# Patient Record
Sex: Female | Born: 1955
Health system: Southern US, Community
[De-identification: ages and names within clinical notes are randomized; demographics above are authoritative.]

## PROBLEM LIST (undated history)

## (undated) DIAGNOSIS — R112 Nausea with vomiting, unspecified: Secondary | ICD-10-CM

## (undated) DIAGNOSIS — I10 Essential (primary) hypertension: Secondary | ICD-10-CM

## (undated) DIAGNOSIS — E119 Type 2 diabetes mellitus without complications: Secondary | ICD-10-CM

## (undated) DIAGNOSIS — Z9889 Other specified postprocedural states: Secondary | ICD-10-CM

## (undated) HISTORY — PX: TUBAL LIGATION: SHX77

## (undated) HISTORY — DX: Type 2 diabetes mellitus without complications: E11.9

## (undated) HISTORY — DX: Essential (primary) hypertension: I10

---

## 2001-08-10 ENCOUNTER — Ambulatory Visit (HOSPITAL_COMMUNITY): Admission: RE | Admit: 2001-08-10 | Discharge: 2001-08-10 | Payer: Self-pay | Admitting: Obstetrics and Gynecology

## 2001-08-10 ENCOUNTER — Encounter: Payer: Self-pay | Admitting: Obstetrics and Gynecology

## 2002-01-24 ENCOUNTER — Other Ambulatory Visit: Admission: RE | Admit: 2002-01-24 | Discharge: 2002-01-24 | Payer: Self-pay | Admitting: Obstetrics and Gynecology

## 2002-03-27 ENCOUNTER — Other Ambulatory Visit: Admission: RE | Admit: 2002-03-27 | Discharge: 2002-03-27 | Payer: Self-pay | Admitting: Obstetrics & Gynecology

## 2002-08-10 ENCOUNTER — Encounter: Payer: Self-pay | Admitting: Obstetrics and Gynecology

## 2002-08-10 ENCOUNTER — Ambulatory Visit (HOSPITAL_COMMUNITY): Admission: RE | Admit: 2002-08-10 | Discharge: 2002-08-10 | Payer: Self-pay | Admitting: Obstetrics and Gynecology

## 2002-08-18 ENCOUNTER — Encounter: Payer: Self-pay | Admitting: Obstetrics and Gynecology

## 2002-08-18 ENCOUNTER — Ambulatory Visit (HOSPITAL_COMMUNITY): Admission: RE | Admit: 2002-08-18 | Discharge: 2002-08-18 | Payer: Self-pay | Admitting: Obstetrics and Gynecology

## 2003-08-21 ENCOUNTER — Encounter: Payer: Self-pay | Admitting: Obstetrics and Gynecology

## 2003-08-21 ENCOUNTER — Ambulatory Visit (HOSPITAL_COMMUNITY): Admission: RE | Admit: 2003-08-21 | Discharge: 2003-08-21 | Payer: Self-pay | Admitting: Obstetrics and Gynecology

## 2004-10-07 ENCOUNTER — Ambulatory Visit (HOSPITAL_COMMUNITY): Admission: RE | Admit: 2004-10-07 | Discharge: 2004-10-07 | Payer: Self-pay | Admitting: Obstetrics and Gynecology

## 2005-10-30 ENCOUNTER — Ambulatory Visit (HOSPITAL_COMMUNITY): Admission: RE | Admit: 2005-10-30 | Discharge: 2005-10-30 | Payer: Self-pay | Admitting: Obstetrics and Gynecology

## 2006-11-01 ENCOUNTER — Ambulatory Visit (HOSPITAL_COMMUNITY): Admission: RE | Admit: 2006-11-01 | Discharge: 2006-11-01 | Payer: Self-pay | Admitting: Obstetrics and Gynecology

## 2007-08-08 ENCOUNTER — Ambulatory Visit (HOSPITAL_COMMUNITY): Admission: RE | Admit: 2007-08-08 | Discharge: 2007-08-08 | Payer: Self-pay | Admitting: Gastroenterology

## 2007-08-08 ENCOUNTER — Ambulatory Visit: Payer: Self-pay | Admitting: Gastroenterology

## 2007-08-08 ENCOUNTER — Encounter: Payer: Self-pay | Admitting: Gastroenterology

## 2007-08-08 HISTORY — PX: COLONOSCOPY: SHX174

## 2007-11-03 ENCOUNTER — Ambulatory Visit (HOSPITAL_COMMUNITY): Admission: RE | Admit: 2007-11-03 | Discharge: 2007-11-03 | Payer: Self-pay | Admitting: Obstetrics and Gynecology

## 2008-03-30 ENCOUNTER — Other Ambulatory Visit: Admission: RE | Admit: 2008-03-30 | Discharge: 2008-03-30 | Payer: Self-pay | Admitting: Obstetrics & Gynecology

## 2008-11-06 ENCOUNTER — Ambulatory Visit (HOSPITAL_COMMUNITY): Admission: RE | Admit: 2008-11-06 | Discharge: 2008-11-06 | Payer: Self-pay | Admitting: Obstetrics and Gynecology

## 2009-04-19 ENCOUNTER — Other Ambulatory Visit: Admission: RE | Admit: 2009-04-19 | Discharge: 2009-04-19 | Payer: Self-pay | Admitting: Obstetrics and Gynecology

## 2009-11-12 ENCOUNTER — Ambulatory Visit (HOSPITAL_COMMUNITY): Admission: RE | Admit: 2009-11-12 | Discharge: 2009-11-12 | Payer: Self-pay | Admitting: Obstetrics and Gynecology

## 2010-11-17 ENCOUNTER — Ambulatory Visit (HOSPITAL_COMMUNITY)
Admission: RE | Admit: 2010-11-17 | Discharge: 2010-11-17 | Payer: Self-pay | Source: Home / Self Care | Admitting: Obstetrics and Gynecology

## 2011-01-03 ENCOUNTER — Encounter: Payer: Self-pay | Admitting: Obstetrics and Gynecology

## 2011-04-28 NOTE — Op Note (Signed)
NAME:  Mary Vazquez, Mary Vazquez           ACCOUNT NO.:  [id number]   MEDICAL RECORD NO.:  [id number]          PATIENT TYPE:  AMB   LOCATION:  DAY                           FACILITY:  [facility]   PHYSICIAN:  Kassie Mends, M.D.      DATE OF BIRTH:  [birth year]   DATE OF PROCEDURE:  08/08/2007  DATE OF DISCHARGE:                               OPERATIVE REPORT   PROCEDURE:  Colonoscopy with cold forceps polypectomy.   INDICATION FOR EXAM:  Mary Vazquez is a 55 year old female who  presents for average risk colon cancer screening.   FINDINGS:  1. A 5-mm sessile descending colon polyp removed via cold forceps.      Otherwise no masses, inflammatory changes, diverticula, or AVMs      seen.  2. Normal retroflexed view of the rectum.  3. Small internal hemorrhoids.   RECOMMENDATIONS:  1. Will call Mary Vazquez with results of her biopsies.  If her      polyp is adenomatous, then her next screening colonoscopy should be      in 5 years.  As well her siblings and children should have a      screening colonoscopy at age 70, and then every 5 years.  2. She should follow a high fiber diet.  She is given a handout on      high-fiber diet and polyps.  3. She is also given information on hemorrhoids because small internal      hemorrhoids were seen on exam.  4. No aspirin, NSAIDs, or anticoagulation for 5 days.   MEDICATIONS:  1. Demerol 75 mg IV.  2. Versed 5 mg IV.   PROCEDURE TECHNIQUE:  Physical exam was performed and an informed  consent was obtained from the patient after explaining the benefits,  risks, and alternatives to the procedure.  The patient was connected to  the monitor and placed in left lateral position.  Continuous oxygen was  provided by nasal cannula.  IV medicine administered through an  indwelling cannula.  After administration of sedation and rectal exam,  the patient's rectum  was intubated; and the scope was advanced under direct visualization to  the cecum.  The scope  was removed slowly by carefully examining the  color, texture, anatomy, and integrity of the mucosa on the way out.  The patient was recovered in endoscopy and discharged home in  satisfactory condition.      Kassie Mends, M.D.  Electronically Signed     SM/MEDQ  D:  08/08/2007  T:  08/08/2007  Job:  [id number]   cc:   Donna Bernard, M.D.  Fax: [phone]

## 2011-11-26 ENCOUNTER — Other Ambulatory Visit: Payer: Self-pay | Admitting: Obstetrics and Gynecology

## 2011-11-26 DIAGNOSIS — Z139 Encounter for screening, unspecified: Secondary | ICD-10-CM

## 2011-11-27 ENCOUNTER — Ambulatory Visit (HOSPITAL_COMMUNITY)
Admission: RE | Admit: 2011-11-27 | Discharge: 2011-11-27 | Disposition: A | Discharge status: Home / Self Care | Payer: BC Managed Care – PPO | Source: Ambulatory Visit | Attending: Obstetrics and Gynecology | Admitting: Obstetrics and Gynecology

## 2011-11-27 DIAGNOSIS — Z1231 Encounter for screening mammogram for malignant neoplasm of breast: Secondary | ICD-10-CM | POA: Insufficient documentation

## 2011-11-27 DIAGNOSIS — Z139 Encounter for screening, unspecified: Secondary | ICD-10-CM

## 2012-06-22 ENCOUNTER — Other Ambulatory Visit: Payer: Self-pay | Admitting: Obstetrics and Gynecology

## 2012-06-22 ENCOUNTER — Other Ambulatory Visit (HOSPITAL_COMMUNITY)
Admission: RE | Admit: 2012-06-22 | Discharge: 2012-06-22 | Disposition: A | Discharge status: Home / Self Care | Payer: BC Managed Care – PPO | Source: Ambulatory Visit | Attending: Obstetrics and Gynecology | Admitting: Obstetrics and Gynecology

## 2012-06-22 DIAGNOSIS — Z01419 Encounter for gynecological examination (general) (routine) without abnormal findings: Secondary | ICD-10-CM | POA: Insufficient documentation

## 2012-07-04 ENCOUNTER — Encounter: Payer: Self-pay | Admitting: Gastroenterology

## 2012-12-01 ENCOUNTER — Other Ambulatory Visit: Payer: Self-pay | Admitting: Obstetrics and Gynecology

## 2012-12-01 DIAGNOSIS — Z139 Encounter for screening, unspecified: Secondary | ICD-10-CM

## 2012-12-16 ENCOUNTER — Ambulatory Visit (HOSPITAL_COMMUNITY)
Admission: RE | Admit: 2012-12-16 | Discharge: 2012-12-16 | Disposition: A | Discharge status: Home / Self Care | Payer: BC Managed Care – PPO | Source: Ambulatory Visit | Attending: Obstetrics and Gynecology | Admitting: Obstetrics and Gynecology

## 2012-12-16 DIAGNOSIS — Z139 Encounter for screening, unspecified: Secondary | ICD-10-CM

## 2012-12-16 DIAGNOSIS — Z1231 Encounter for screening mammogram for malignant neoplasm of breast: Secondary | ICD-10-CM | POA: Insufficient documentation

## 2012-12-21 ENCOUNTER — Encounter: Payer: Self-pay | Admitting: Gastroenterology

## 2012-12-22 ENCOUNTER — Other Ambulatory Visit: Payer: Self-pay | Admitting: Gastroenterology

## 2012-12-22 ENCOUNTER — Encounter: Payer: Self-pay | Admitting: Gastroenterology

## 2012-12-22 ENCOUNTER — Ambulatory Visit (INDEPENDENT_AMBULATORY_CARE_PROVIDER_SITE_OTHER): Payer: BC Managed Care – PPO | Admitting: Gastroenterology

## 2012-12-22 VITALS — BP 157/96 | HR 85 | Temp 97.4°F | Ht 67.0 in | Wt 165.6 lb

## 2012-12-22 DIAGNOSIS — Z1211 Encounter for screening for malignant neoplasm of colon: Secondary | ICD-10-CM

## 2012-12-22 DIAGNOSIS — D369 Benign neoplasm, unspecified site: Secondary | ICD-10-CM | POA: Insufficient documentation

## 2012-12-22 MED ORDER — PEG 3350-KCL-NA BICARB-NACL 420 G PO SOLR: 4000.0000 mL | ORAL | Status: DC

## 2012-12-22 NOTE — Assessment & Plan Note (Signed)
57 year old female with hx of tubular adenoma in 2008, due for routine surveillance at this time. Notes scant hematochezia if episodes of diarrhea, which is rare. Likely secondary to internal hemorrhoids. Otherwise, she has no lower or upper GI symptoms of concern.  Proceed with colonoscopy with Dr. Darrick Penna in the near future. The risks, benefits, and alternatives have been discussed in detail with the patient. They state understanding and desire to proceed.

## 2012-12-22 NOTE — Progress Notes (Signed)
Primary Care Physician:  Harlow Asa, MD Primary Gastroenterologist:  Dr. Darrick Penna   Chief Complaint  Patient presents with  . Colonoscopy    HPI:   Mary Vazquez is a 57 year old female who presents today for surveillance colonoscopy. Last TCS by Dr. Darrick Penna in 2008 with internal hemorrhoids and tubular adenoma. Notes scant hematochezia in the setting of occasional diarrhea. Diarrhea rare, every once in awhile. No abdominal pain. No nausea or vomiting. No dysphagia, rare heartburn but not enough to be concerned. Good appetite.   Past Medical History  Diagnosis Date  . Diabetes   . Hypertension     Past Surgical History  Procedure Date  . Colonoscopy  08/08/2007    SLF:  A 5-mm sessile descending colon polyp removed via cold forceps.Normal retroflexed view of the rectum/ Small internal hemorrhoids, PATH: tubular adenoma  . Tubal ligation     Current Outpatient Prescriptions  Medication Sig Dispense Refill  . allopurinol (ZYLOPRIM) 100 MG tablet Take 100 mg by mouth daily.      . COD LIVER OIL PO Take 530 mg by mouth daily.      Marland Kitchen glipiZIDE (GLUCOTROL XL) 2.5 MG 24 hr tablet Take 2.5 mg by mouth daily.      . metFORMIN (GLUCOPHAGE) 500 MG tablet Take 500 mg by mouth 3 (three) times daily. 2 pills in am  1 pill at lunch 2 pills in pm      . Multiple Vitamin (MULTIVITAMIN) capsule Take 1 capsule by mouth daily.      . Nutritional Supplements (PYCNOGENOL PO) Take 100 mg by mouth daily.        Allergies as of 12/22/2012  . (Not on File)    Family History  Problem Relation Age of Onset  . Colon cancer Neg Hx     History   Social History  . Marital Status: Married    Spouse Name: N/A    Number of Children: N/A  . Years of Education: N/A   Occupational History  . Not on file.   Social History Main Topics  . Smoking status: Never Smoker   . Smokeless tobacco: Not on file  . Alcohol Use: No  . Drug Use: No  . Sexually Active: Not on file   Other Topics Concern  .  Not on file   Social History Narrative  . No narrative on file    Review of Systems: Gen: + hot flashes CV: Denies chest pain, heart palpitations, peripheral edema, syncope.  Resp: Denies shortness of breath at rest or with exertion. Denies wheezing or cough.  GI: SEE HPI GU : Denies urinary burning, urinary frequency, urinary hesitancy MS: Denies joint pain, muscle weakness, cramps, or limitation of movement.  Derm: Denies rash, itching, dry skin Psych: Denies depression, anxiety, memory loss, and confusion Heme: Denies bruising, bleeding, and enlarged lymph nodes.  Physical Exam: BP 157/96  Pulse 85  Temp 97.4 F (36.3 C) (Oral)  Ht 5\' 7"  (1.702 m)  Wt 165 lb 9.6 oz (75.116 kg)  BMI 25.94 kg/m2 General:   Alert and oriented. Pleasant and cooperative. Well-nourished and well-developed.  Head:  Normocephalic and atraumatic. Eyes:  Without icterus, sclera clear and conjunctiva pink.  Ears:  Normal auditory acuity. Nose:  No deformity, discharge,  or lesions. Mouth:  No deformity or lesions, oral mucosa pink.  Neck:  Supple, without mass or thyromegaly. Lungs:  Clear to auscultation bilaterally. No wheezes, rales, or rhonchi. No distress.  Heart:  S1, S2 present without  murmurs appreciated.  Abdomen:  +BS, soft, non-tender and non-distended. No HSM noted. No guarding or rebound. No masses appreciated. Small umbilical hernia.  Rectal:  Deferred  Msk:  Symmetrical without gross deformities. Normal posture. Extremities:  Without clubbing or edema. Neurologic:  Alert and  oriented x4;  grossly normal neurologically. Skin:  Intact without significant lesions or rashes. Cervical Nodes:  No significant cervical adenopathy. Psych:  Alert and cooperative. Normal mood and affect.

## 2012-12-22 NOTE — Progress Notes (Signed)
Faxed to PCP

## 2012-12-22 NOTE — Patient Instructions (Addendum)
You have been scheduled for a colonoscopy with Dr. Darrick Penna.  Further recommendations to follow once completed.

## 2012-12-26 ENCOUNTER — Encounter (HOSPITAL_COMMUNITY): Payer: Self-pay | Admitting: Pharmacy Technician

## 2013-01-09 ENCOUNTER — Ambulatory Visit (HOSPITAL_COMMUNITY)
Admission: RE | Admit: 2013-01-09 | Discharge: 2013-01-09 | Disposition: A | Discharge status: Home / Self Care | Payer: BC Managed Care – PPO | Source: Ambulatory Visit | Attending: Gastroenterology | Admitting: Gastroenterology

## 2013-01-09 ENCOUNTER — Encounter (HOSPITAL_COMMUNITY)
Admission: RE | Disposition: A | Discharge status: Home / Self Care | Payer: Self-pay | Source: Ambulatory Visit | Attending: Gastroenterology

## 2013-01-09 ENCOUNTER — Encounter (HOSPITAL_COMMUNITY): Payer: Self-pay | Admitting: *Deleted

## 2013-01-09 DIAGNOSIS — Z860100 Personal history of colon polyps, unspecified: Secondary | ICD-10-CM | POA: Insufficient documentation

## 2013-01-09 DIAGNOSIS — Z01812 Encounter for preprocedural laboratory examination: Secondary | ICD-10-CM | POA: Insufficient documentation

## 2013-01-09 DIAGNOSIS — I10 Essential (primary) hypertension: Secondary | ICD-10-CM | POA: Insufficient documentation

## 2013-01-09 DIAGNOSIS — E119 Type 2 diabetes mellitus without complications: Secondary | ICD-10-CM | POA: Insufficient documentation

## 2013-01-09 DIAGNOSIS — K648 Other hemorrhoids: Secondary | ICD-10-CM | POA: Insufficient documentation

## 2013-01-09 DIAGNOSIS — Z1211 Encounter for screening for malignant neoplasm of colon: Secondary | ICD-10-CM

## 2013-01-09 HISTORY — DX: Nausea with vomiting, unspecified: R11.2

## 2013-01-09 HISTORY — PX: COLONOSCOPY: SHX5424

## 2013-01-09 HISTORY — DX: Other specified postprocedural states: Z98.890

## 2013-01-09 LAB — GLUCOSE, CAPILLARY: Glucose-Capillary: 141 mg/dL — ABNORMAL HIGH (ref 70–99)

## 2013-01-09 SURGERY — COLONOSCOPY
Anesthesia: Moderate Sedation

## 2013-01-09 MED ORDER — SODIUM CHLORIDE 0.45 % IV SOLN
INTRAVENOUS | Status: DC
  Administered 2013-01-09: 08:00:00 via INTRAVENOUS

## 2013-01-09 MED ORDER — MEPERIDINE HCL 100 MG/ML IJ SOLN
INTRAMUSCULAR | Status: AC
  Filled 2013-01-09: qty 2

## 2013-01-09 MED ORDER — MIDAZOLAM HCL 5 MG/5ML IJ SOLN
INTRAMUSCULAR | Status: AC
  Filled 2013-01-09: qty 10

## 2013-01-09 MED ORDER — STERILE WATER FOR IRRIGATION IR SOLN
Status: DC | PRN
  Administered 2013-01-09: 09:00:00

## 2013-01-09 MED ORDER — MIDAZOLAM HCL 5 MG/5ML IJ SOLN
INTRAMUSCULAR | Status: DC | PRN
  Administered 2013-01-09 (×2): 2 mg via INTRAVENOUS

## 2013-01-09 MED ORDER — MEPERIDINE HCL 100 MG/ML IJ SOLN
INTRAMUSCULAR | Status: DC | PRN
  Administered 2013-01-09 (×2): 25 mg via INTRAVENOUS

## 2013-01-09 NOTE — Op Note (Signed)
[facility] [address] [place], [address]   COLONOSCOPY PROCEDURE REPORT  PATIENT: Mary, Vazquez  MR#: [id number] BIRTHDATE: [birth year] , 56  yrs. old GENDER: Female ENDOSCOPIST: Jonette Eva, MD REFERRED HQ:[id number] Gerda Diss, M.D. PROCEDURE DATE:  01/09/2013 PROCEDURE:   Colonoscopy, screening INDICATIONS:Patient's personal history of adenomatous colon polyps-5 MM SIMPLE ADENOMA REMOVED IN 2008 MEDICATIONS: Demerol 50 mg IV and Versed 4 mg IV  DESCRIPTION OF PROCEDURE:    Physical exam was performed.  Informed consent was obtained from the patient after explaining the benefits, risks, and alternatives to procedure.  The patient was connected to monitor and placed in left lateral position. Continuous oxygen was provided by nasal cannula and IV medicine administered through an indwelling cannula.  After administration of sedation and rectal exam, the patients rectum was intubated and the Pentax Colonoscope [phone]  colonoscope was advanced under direct visualization to the cecum.  The scope was removed slowly by carefully examining the color, texture, anatomy, and integrity mucosa on the way out.  The patient was recovered in endoscopy and discharged home in satisfactory condition.       COLON FINDINGS: The colon was otherwise normal.  There was no diverticulosis, inflammation, polyps or cancers unless previously stated. Moderate sized internal hemorrhoids were found.  PREP QUALITY: good. CECAL W/D TIME: 12 minutes COMPLICATIONS: None  ENDOSCOPIC IMPRESSION: 1.   The colon was otherwise normal 2.   Moderate sized internal hemorrhoids  RECOMMENDATIONS: HIGH FIBER DIET PREPARATION H AS NEEDED TCS IN 10 YEARS       _______________________________ eSignedJonette Eva, MD 01/09/2013 9:55 AM

## 2013-01-09 NOTE — H&P (Signed)
  Primary Care Physician:  Harlow Asa, MD Primary Gastroenterologist:  Dr. Darrick Penna  Pre-Procedure History & Physical: HPI:  RYELEE Vazquez is a 57 y.o. female here for COLON CANCER SCREENING.   Past Medical History  Diagnosis Date  . Diabetes   . Hypertension   . PONV (postoperative nausea and vomiting)     Past Surgical History  Procedure Date  . Colonoscopy  08/08/2007    SLF:  A 5-mm sessile descending colon polyp removed via cold forceps.Normal retroflexed view of the rectum/ Small internal hemorrhoids, PATH: tubular adenoma  . Tubal ligation     Prior to Admission medications   Medication Sig Start Date End Date Taking? Authorizing Provider  allopurinol (ZYLOPRIM) 100 MG tablet Take 100 mg by mouth daily.   Yes Historical Provider, MD  COD LIVER OIL PO Take 530 mg by mouth daily.   Yes Historical Provider, MD  enalapril (VASOTEC) 10 MG tablet Take 1 tablet by mouth Daily. 12/06/12  Yes Historical Provider, MD  glipiZIDE (GLUCOTROL XL) 2.5 MG 24 hr tablet Take 2.5 mg by mouth daily.   Yes Historical Provider, MD  glyBURIDE (DIABETA) 2.5 MG tablet Take 1 tablet by mouth Daily. 12/13/12  Yes Historical Provider, MD  metFORMIN (GLUCOPHAGE) 500 MG tablet Take 500 mg by mouth 3 (three) times daily. 2 pills in am  1 pill at lunch 2 pills in pm   Yes Historical Provider, MD  Multiple Vitamin (MULTIVITAMIN) capsule Take 1 capsule by mouth daily.   Yes Historical Provider, MD  Nutritional Supplements (PYCNOGENOL PO) Take 100 mg by mouth daily.   Yes Historical Provider, MD    Allergies as of 12/22/2012  . (Not on File)    Family History  Problem Relation Age of Onset  . Colon cancer Neg Hx     History   Social History  . Marital Status: Married    Spouse Name: N/A    Number of Children: N/A  . Years of Education: N/A   Occupational History  . Not on file.   Social History Main Topics  . Smoking status: Never Smoker   . Smokeless tobacco: Not on file  . Alcohol  Use: No  . Drug Use: No  . Sexually Active: Not on file   Other Topics Concern  . Not on file   Social History Narrative  . No narrative on file    Review of Systems: See HPI, otherwise negative ROS   Physical Exam: BP 156/99  Pulse 67  Temp 97.5 F (36.4 C) (Oral)  Resp 12  Ht 5\' 7"  (1.702 m)  Wt 165 lb (74.844 kg)  BMI 25.84 kg/m2  SpO2 99% General:   Alert,  pleasant and cooperative in NAD Head:  Normocephalic and atraumatic. Neck:  Supple; Lungs:  Clear throughout to auscultation.    Heart:  Regular rate and rhythm. Abdomen:  Soft, nontender and nondistended. Normal bowel sounds, without guarding, and without rebound.   Neurologic:  Alert and  oriented x4;  grossly normal neurologically.  Impression/Plan:     SCREENING  Plan:  1. TCS TODAY

## 2013-01-11 ENCOUNTER — Encounter (HOSPITAL_COMMUNITY): Payer: Self-pay | Admitting: Gastroenterology

## 2013-01-28 ENCOUNTER — Other Ambulatory Visit: Payer: Self-pay

## 2013-02-22 NOTE — Progress Notes (Signed)
TCS JAN 2014 [facility]  REVIEWED.

## 2013-03-02 ENCOUNTER — Encounter: Payer: Self-pay | Admitting: Family Medicine

## 2013-03-02 DIAGNOSIS — G4700 Insomnia, unspecified: Secondary | ICD-10-CM | POA: Insufficient documentation

## 2013-03-02 DIAGNOSIS — I10 Essential (primary) hypertension: Secondary | ICD-10-CM | POA: Insufficient documentation

## 2013-03-02 DIAGNOSIS — E119 Type 2 diabetes mellitus without complications: Secondary | ICD-10-CM | POA: Insufficient documentation

## 2013-03-02 DIAGNOSIS — K219 Gastro-esophageal reflux disease without esophagitis: Secondary | ICD-10-CM

## 2013-03-03 ENCOUNTER — Encounter: Payer: Self-pay | Admitting: Family Medicine

## 2013-03-03 ENCOUNTER — Ambulatory Visit (INDEPENDENT_AMBULATORY_CARE_PROVIDER_SITE_OTHER): Payer: BC Managed Care – PPO | Admitting: Family Medicine

## 2013-03-03 VITALS — BP 142/98 | HR 80 | Ht 67.0 in | Wt 162.5 lb

## 2013-03-03 DIAGNOSIS — R809 Proteinuria, unspecified: Secondary | ICD-10-CM

## 2013-03-03 DIAGNOSIS — K219 Gastro-esophageal reflux disease without esophagitis: Secondary | ICD-10-CM

## 2013-03-03 DIAGNOSIS — G4700 Insomnia, unspecified: Secondary | ICD-10-CM

## 2013-03-03 DIAGNOSIS — E119 Type 2 diabetes mellitus without complications: Secondary | ICD-10-CM

## 2013-03-03 DIAGNOSIS — I10 Essential (primary) hypertension: Secondary | ICD-10-CM

## 2013-03-03 LAB — POCT GLYCOSYLATED HEMOGLOBIN (HGB A1C): Hemoglobin A1C: 7.2

## 2013-03-03 MED ORDER — ENALAPRIL MALEATE 10 MG PO TABS: 10.0000 mg | ORAL_TABLET | Freq: Every day | ORAL | Status: DC

## 2013-03-03 MED ORDER — GLYBURIDE 2.5 MG PO TABS: 2.5000 mg | ORAL_TABLET | Freq: Every day | ORAL | Status: DC

## 2013-03-03 MED ORDER — METFORMIN HCL 500 MG PO TABS: ORAL_TABLET | ORAL | Status: DC

## 2013-03-03 NOTE — Patient Instructions (Signed)
Use two tablespoons of mylanta as needed for belching and chest pressure

## 2013-03-03 NOTE — Progress Notes (Signed)
  Subjective:    Patient ID: Mary Vazquez, female    DOB: [birth year], 57 y.o.   MRN: [id number]  Gastrophageal Reflux She reports no chest pain.  Diabetes She has type 2 diabetes mellitus. Her disease course has been worsening. Hypoglycemia symptoms include nervousness/anxiousness. (See above) Pertinent negatives for diabetes include no blurred vision, no chest pain and no polydipsia. Pertinent negatives for hypoglycemia complications include no blackouts, no hospitalization and no nocturnal hypoglycemia. Symptoms are stable. Diabetic symptoms present: sugars low at times numbers often in the sixties. There are no diabetic complications. There are no known risk factors for coronary artery disease. Current diabetic treatment includes oral agent (dual therapy) and diet.   BPs elevated at times sinus cong meds prn alka seltzer prn. increse aching. Some burping and belching. Increased burping.  Review of Systems  Eyes: Negative for blurred vision.  Cardiovascular: Negative for chest pain.  Endocrine: Negative for polydipsia.  Psychiatric/Behavioral: The patient is nervous/anxious.   All other systems reviewed and are negative.   Results for orders placed in visit on 03/03/13  POCT GLYCOSYLATED HEMOGLOBIN (HGB A1C)      Result Value Range   Hemoglobin A1C 7.2         Objective:   Physical Exam  Constitutional: She is oriented to person, place, and time. She appears well-developed and well-nourished.  HENT:  Head: Normocephalic and atraumatic.  Eyes: Pupils are equal, round, and reactive to light. Right eye exhibits no discharge.  Neck: Normal range of motion. Neck supple.  Cardiovascular: Normal rate and regular rhythm.   Pulmonary/Chest: Effort normal.  Abdominal: Soft. There is tenderness (very minimal tenderness epigastrium).  Musculoskeletal: Normal range of motion.  Neurological: She is alert and oriented to person, place, and time.  Skin:  Sensory exam of the feet pulses  all within normal limits.          Assessment & Plan:  Impression #1 type 2 diabetes. Good control though not perfect. #2 insomnia ongoing challenge intermittently. #3 reflux discussed. increased belching. Subtle pressure at times after eating. #4 hypertension fair control though not ideal Plan for now maintain diabetes medicine all at same dose. Diet exercise discussed and encouraged. 25 minutes spent most in discussion. [email or web address]

## 2013-03-04 DIAGNOSIS — R809 Proteinuria, unspecified: Secondary | ICD-10-CM | POA: Insufficient documentation

## 2013-03-11 ENCOUNTER — Other Ambulatory Visit: Payer: Self-pay | Admitting: Family Medicine

## 2013-03-15 ENCOUNTER — Other Ambulatory Visit: Payer: Self-pay | Admitting: Family Medicine

## 2013-03-15 ENCOUNTER — Telehealth: Payer: Self-pay | Admitting: Family Medicine

## 2013-03-15 NOTE — Telephone Encounter (Signed)
Patient needs a refill of her Metformin 500 mg and glyburide 2.5mg . The pharmacy is saying that they havent got it.  [facility]

## 2013-03-15 NOTE — Telephone Encounter (Signed)
Meds were electronically sent to pharmacy.

## 2013-04-11 ENCOUNTER — Other Ambulatory Visit: Payer: Self-pay | Admitting: Family Medicine

## 2013-04-12 NOTE — Telephone Encounter (Signed)
OV needed, card mailed

## 2013-04-14 ENCOUNTER — Other Ambulatory Visit: Payer: Self-pay | Admitting: Family Medicine

## 2013-05-11 ENCOUNTER — Other Ambulatory Visit: Payer: Self-pay | Admitting: Family Medicine

## 2013-05-11 ENCOUNTER — Encounter: Payer: Self-pay | Admitting: Family Medicine

## 2013-05-11 ENCOUNTER — Ambulatory Visit (INDEPENDENT_AMBULATORY_CARE_PROVIDER_SITE_OTHER): Payer: BC Managed Care – PPO | Admitting: Family Medicine

## 2013-05-11 VITALS — BP 176/110 | Wt 162.8 lb

## 2013-05-11 DIAGNOSIS — E119 Type 2 diabetes mellitus without complications: Secondary | ICD-10-CM

## 2013-05-11 DIAGNOSIS — I10 Essential (primary) hypertension: Secondary | ICD-10-CM

## 2013-05-11 DIAGNOSIS — R809 Proteinuria, unspecified: Secondary | ICD-10-CM

## 2013-05-11 DIAGNOSIS — G4700 Insomnia, unspecified: Secondary | ICD-10-CM

## 2013-05-11 LAB — POCT GLYCOSYLATED HEMOGLOBIN (HGB A1C): Hemoglobin A1C: 7.3

## 2013-05-11 NOTE — Progress Notes (Signed)
  Subjective:    Patient ID: Mary Vazquez, female    DOB: Feb 05, 1956, 57 y.o.   MRN: [id number]  Diabetes She has type 2 diabetes mellitus. Pertinent negatives for hypoglycemia include no confusion, dizziness or mood changes. Pertinent negatives for hypoglycemia complications include no blackouts. Symptoms are stable. Pertinent negatives for diabetic complications include no CVA. There are no known risk factors for coronary artery disease. She is compliant with treatment most of the time. She is following a diabetic diet. When asked about meal planning, she reported none. Home blood sugar record trend: no recall of recent blood sugar results.  Hypertension There is no history of CVA.   patient claims compliance with her blood pressure medication. Watching her salt intake.  Patient did have confirmed a micro-proteinuria on last assessment. Discussion held in this regard. exercis reg. Good walking Results for orders placed in visit on 05/11/13  POCT GLYCOSYLATED HEMOGLOBIN (HGB A1C)      Result Value Range   Hemoglobin A1C 7.3        Review of Systems  Neurological: Negative for dizziness.  Psychiatric/Behavioral: Negative for confusion.       Objective:   Physical Exam  Alert no acute distress. HEENT normal. Vitals reviewed. Lungs clear. Heart regular rate and rhythm. Feet sensation intact pulses good no edema.      Assessment & Plan:  Impression 1 type 2 diabetes control good though not perfect. Discussed. #2 hypertension good control. #3 chronic proteinuria discussed. Related likely to diabetes. Plan diet exercise discussed. Maintain same medications. Recheck in 6 months.

## 2013-06-13 ENCOUNTER — Other Ambulatory Visit: Payer: Self-pay | Admitting: Family Medicine

## 2013-06-14 ENCOUNTER — Other Ambulatory Visit: Payer: Self-pay | Admitting: Family Medicine

## 2013-10-19 ENCOUNTER — Other Ambulatory Visit: Payer: Self-pay

## 2013-12-29 ENCOUNTER — Other Ambulatory Visit: Payer: Self-pay | Admitting: Family Medicine

## 2014-01-04 ENCOUNTER — Other Ambulatory Visit: Payer: Self-pay | Admitting: Obstetrics and Gynecology

## 2014-01-04 DIAGNOSIS — Z139 Encounter for screening, unspecified: Secondary | ICD-10-CM

## 2014-01-08 ENCOUNTER — Ambulatory Visit (INDEPENDENT_AMBULATORY_CARE_PROVIDER_SITE_OTHER): Payer: BC Managed Care – PPO | Admitting: Family Medicine

## 2014-01-08 ENCOUNTER — Ambulatory Visit (HOSPITAL_COMMUNITY)
Admission: RE | Admit: 2014-01-08 | Discharge: 2014-01-08 | Disposition: A | Discharge status: Home / Self Care | Payer: BC Managed Care – PPO | Source: Ambulatory Visit | Attending: Obstetrics and Gynecology | Admitting: Obstetrics and Gynecology

## 2014-01-08 ENCOUNTER — Encounter: Payer: Self-pay | Admitting: Family Medicine

## 2014-01-08 VITALS — BP 148/90 | Ht 67.0 in | Wt 161.6 lb

## 2014-01-08 DIAGNOSIS — IMO0001 Reserved for inherently not codable concepts without codable children: Secondary | ICD-10-CM

## 2014-01-08 DIAGNOSIS — R809 Proteinuria, unspecified: Secondary | ICD-10-CM

## 2014-01-08 DIAGNOSIS — E785 Hyperlipidemia, unspecified: Secondary | ICD-10-CM

## 2014-01-08 DIAGNOSIS — Z79899 Other long term (current) drug therapy: Secondary | ICD-10-CM

## 2014-01-08 DIAGNOSIS — R252 Cramp and spasm: Secondary | ICD-10-CM

## 2014-01-08 DIAGNOSIS — Z1231 Encounter for screening mammogram for malignant neoplasm of breast: Secondary | ICD-10-CM | POA: Insufficient documentation

## 2014-01-08 DIAGNOSIS — E1165 Type 2 diabetes mellitus with hyperglycemia: Principal | ICD-10-CM

## 2014-01-08 DIAGNOSIS — Z139 Encounter for screening, unspecified: Secondary | ICD-10-CM

## 2014-01-08 DIAGNOSIS — I10 Essential (primary) hypertension: Secondary | ICD-10-CM

## 2014-01-08 LAB — POCT GLYCOSYLATED HEMOGLOBIN (HGB A1C): HEMOGLOBIN A1C: 7.4

## 2014-01-08 MED ORDER — GLIPIZIDE 2.5 MG HALF TABLET: 2.5000 mg | ORAL_TABLET | Freq: Two times a day (BID) | ORAL | Status: DC

## 2014-01-08 MED ORDER — METFORMIN HCL 500 MG PO TABS: ORAL_TABLET | ORAL | Status: DC

## 2014-01-08 MED ORDER — ENALAPRIL MALEATE 10 MG PO TABS: ORAL_TABLET | ORAL | Status: DC

## 2014-01-08 NOTE — Progress Notes (Signed)
   Subjective:    Patient ID: Mary Vazquez, female    DOB: March 09, 1956, 58 y.o.   MRN: [id number]  HPI patient is here today for a diabetic check up.diet overall presty good  BP numb 12 to 140 syst  Eye doc last name not given to this may.  Exercising regularly 5 times per week.      She states her blood sugars run from 109-160's.  C/O cramps in here feet. Pain intermittent. Primarily in the feet alone. Severe times. Occurs just episodically. Results for orders placed in visit on 01/08/14  POCT GLYCOSYLATED HEMOGLOBIN (HGB A1C)      Result Value Range   Hemoglobin A1C 7.4       Review of Systems No headache no chest pain no back pain no change in bowel habits no blood in stool ROS other negative    Objective:   Physical Exam Alert no apparent distress. H&T normal. Lungs clear. Heart regular in rhythm. Feet pulses good sensation intact. No edema.       Assessment & Plan:  Impression 1 hypertension good control discussed. #2 type 2 diabetes control suboptimal in discussed #3 foot cramps. Will check potassium. Regular exercise encourage and already performed. No prescription medicines discussed #4 micro-proteinuria combination diabetes. Discussed once again. plan switch to glipizide 2.5 twice a day. Maintain Vasotec we'll reassess micro-proteinuria also.Marland Kitchen Appropriate blood work. Check in 6 months.

## 2014-01-09 ENCOUNTER — Encounter: Payer: Self-pay | Admitting: Family Medicine

## 2014-01-09 LAB — HEPATIC FUNCTION PANEL
ALT: 16 U/L (ref 0–35)
AST: 21 U/L (ref 0–37)
Albumin: 4.8 g/dL (ref 3.5–5.2)
Alkaline Phosphatase: 56 U/L (ref 39–117)
BILIRUBIN DIRECT: 0.1 mg/dL (ref 0.0–0.3)
Indirect Bilirubin: 0.3 mg/dL (ref 0.0–0.9)
TOTAL PROTEIN: 7.5 g/dL (ref 6.0–8.3)
Total Bilirubin: 0.4 mg/dL (ref 0.3–1.2)

## 2014-01-09 LAB — BASIC METABOLIC PANEL
BUN: 14 mg/dL (ref 6–23)
CALCIUM: 9.8 mg/dL (ref 8.4–10.5)
CO2: 29 mEq/L (ref 19–32)
Chloride: 101 mEq/L (ref 96–112)
Creat: 0.91 mg/dL (ref 0.50–1.10)
GLUCOSE: 128 mg/dL — AB (ref 70–99)
POTASSIUM: 4.2 meq/L (ref 3.5–5.3)
SODIUM: 138 meq/L (ref 135–145)

## 2014-01-09 LAB — LIPID PANEL
CHOL/HDL RATIO: 2.8 ratio
CHOLESTEROL: 220 mg/dL — AB (ref 0–200)
HDL: 78 mg/dL (ref >39)
LDL Cholesterol: 125 mg/dL — ABNORMAL HIGH (ref 0–99)
TRIGLYCERIDES: 87 mg/dL (ref <150)
VLDL: 17 mg/dL (ref 0–40)

## 2014-01-09 LAB — MICROALBUMIN, URINE: MICROALB UR: 1.22 mg/dL (ref 0.00–1.89)

## 2014-01-14 ENCOUNTER — Encounter: Payer: Self-pay | Admitting: Family Medicine

## 2014-04-03 ENCOUNTER — Encounter: Payer: Self-pay | Admitting: Family Medicine

## 2014-04-05 ENCOUNTER — Other Ambulatory Visit: Payer: Self-pay | Admitting: *Deleted

## 2014-04-05 NOTE — Telephone Encounter (Signed)
[facility] 04/05/14 

## 2014-04-05 NOTE — Telephone Encounter (Signed)
Discussed with patient. Pt to try claritin and call back if symptoms worse or not clearing up.  No fever, No shortness of breath or wheezing, drainage is clear to yellow.

## 2014-04-05 NOTE — Telephone Encounter (Signed)
Nurse to spk with pt, if simple allergy otc claritin, is sounds infectious offer appt

## 2014-06-23 ENCOUNTER — Other Ambulatory Visit: Payer: Self-pay | Admitting: Family Medicine

## 2014-07-26 ENCOUNTER — Other Ambulatory Visit: Payer: Self-pay | Admitting: Family Medicine

## 2014-08-23 ENCOUNTER — Encounter: Payer: Self-pay | Admitting: Family Medicine

## 2014-08-23 ENCOUNTER — Ambulatory Visit (INDEPENDENT_AMBULATORY_CARE_PROVIDER_SITE_OTHER): Payer: BC Managed Care – PPO | Admitting: Family Medicine

## 2014-08-23 VITALS — BP 148/96 | Ht 67.0 in | Wt 163.0 lb

## 2014-08-23 DIAGNOSIS — E118 Type 2 diabetes mellitus with unspecified complications: Secondary | ICD-10-CM

## 2014-08-23 DIAGNOSIS — I10 Essential (primary) hypertension: Secondary | ICD-10-CM

## 2014-08-23 DIAGNOSIS — K921 Melena: Secondary | ICD-10-CM

## 2014-08-23 DIAGNOSIS — G4700 Insomnia, unspecified: Secondary | ICD-10-CM

## 2014-08-23 DIAGNOSIS — E1165 Type 2 diabetes mellitus with hyperglycemia: Secondary | ICD-10-CM

## 2014-08-23 DIAGNOSIS — Z23 Encounter for immunization: Secondary | ICD-10-CM

## 2014-08-23 DIAGNOSIS — IMO0001 Reserved for inherently not codable concepts without codable children: Secondary | ICD-10-CM

## 2014-08-23 LAB — POCT GLYCOSYLATED HEMOGLOBIN (HGB A1C): HEMOGLOBIN A1C: 7.2

## 2014-08-23 MED ORDER — GLIPIZIDE 5 MG PO TABS: ORAL_TABLET | ORAL | Status: DC

## 2014-08-23 MED ORDER — METFORMIN HCL 500 MG PO TABS: ORAL_TABLET | ORAL | Status: DC

## 2014-08-23 MED ORDER — ENALAPRIL MALEATE 10 MG PO TABS: ORAL_TABLET | ORAL | Status: DC

## 2014-08-23 NOTE — Patient Instructions (Signed)
miralax use freely one scoop daily   bp med night time

## 2014-08-23 NOTE — Progress Notes (Signed)
   Subjective:    Patient ID: Mary Vazquez, female    DOB: [birth year], 58 y.o.   MRN: [id number]  Diabetes She presents for her follow-up diabetic visit. She has type 2 diabetes mellitus. Current diabetic treatment includes oral agent (dual therapy). She is compliant with treatment all of the time. She is following a diabetic diet. Exercise: 5 days a week. She does not see a podiatrist.Eye exam is current.   Hemorroids.   No maj hx of hemorrhoids. No hx of pain r itching. occas slight red blood, saw clumps of blood in stool. States overall sugars have been in good control.  Exercising quite a bit.  Normal colonoscopy just 18 months ago.  lSaw small clumps of blood in toliet 2 days ago.   Bm,s occas constip generally not. Some constipation some pain with bowel movements.  Sulfa amount of blood with bowel movements recently.  Results for orders placed in visit on 08/23/14  POCT GLYCOSYLATED HEMOGLOBIN (HGB A1C)      Result Value Ref Range   Hemoglobin A1C 7.2     Working hard on diet and exercise     Review of Systems No domino pain no chest pain no headache no back pain no change in urinary habits    Objective:   Physical Exam Alert no apparent distress vitals stable HEENT normal. Lungs clear. Heart regular in rhythm. Ankles without edema. Rectal exam small actively bleeding hemorrhoid external noted.       Assessment & Plan:  #1 type 2 diabetes good control discussed #2 bleeding hernia with occasional hematochezia no need for further workup for now. Discussed if persists or worsens may need to consider further evaluation.

## 2014-10-03 ENCOUNTER — Encounter: Payer: Self-pay | Admitting: Family Medicine

## 2014-10-04 ENCOUNTER — Telehealth: Payer: Self-pay | Admitting: *Deleted

## 2014-10-04 ENCOUNTER — Telehealth: Payer: Self-pay | Admitting: Obstetrics & Gynecology

## 2014-10-04 NOTE — Telephone Encounter (Signed)
No period since 2008, pt stated started to notice vaginal spotting. Pt states has not had an hysterectomy.  Call transferred to front staff for an appt with Dr. Glo Herring.

## 2014-10-09 ENCOUNTER — Encounter: Payer: Self-pay | Admitting: Obstetrics and Gynecology

## 2014-10-09 ENCOUNTER — Ambulatory Visit (INDEPENDENT_AMBULATORY_CARE_PROVIDER_SITE_OTHER): Payer: BC Managed Care – PPO | Admitting: Obstetrics and Gynecology

## 2014-10-09 VITALS — BP 120/74 | Ht 67.0 in | Wt 164.0 lb

## 2014-10-09 DIAGNOSIS — N950 Postmenopausal bleeding: Secondary | ICD-10-CM | POA: Insufficient documentation

## 2014-10-09 NOTE — Progress Notes (Signed)
Patient ID: DEMETRESS TIFT, female   DOB: 1956/03/24, 58 y.o.   MRN: [id number] Pt here today for  Patient name: DANIE HANNIG MRN [id number]  Date of birth: 1956/01/13  CC & HPI:  LOUIS GAW is a 58 y.o. female presenting today for vaginal spotting. Pt states that she has only seen the spotting that one day. Pt states that she noticed the spotting after walking one morning. PT denies any bleeding since this episode. Pt denies any pain or discomfort.    [facility] Visit   first episode of PMB. + vasomotor sx.   ROS:  Walks daily, 2 miles. Noted bleeding after a walk no lesions, no pain.  Pertinent History Reviewed:   Reviewed: Significant for postmenopausal Medical         Past Medical History  Diagnosis Date  . Diabetes   . Hypertension   . PONV (postoperative nausea and vomiting)                               Surgical Hx:    Past Surgical History  Procedure Laterality Date  . Colonoscopy   08/08/2007    SLF:  A 5-mm sessile descending colon polyp removed via cold forceps.Normal retroflexed view of the rectum/ Small internal hemorrhoids, PATH: tubular adenoma  . Tubal ligation    . Colonoscopy  01/09/2013    Procedure: COLONOSCOPY;  Surgeon: Danie Binder, MD;  Location: [facility];  Service: Endoscopy;  Laterality: N/A;  8:30   Medications: Reviewed & Updated - see associated section                      Current outpatient prescriptions:enalapril (VASOTEC) 10 MG tablet, TAKE 1 TABLET EVERY MORNING, Disp: 30 tablet, Rfl: 5;  glipiZIDE (GLUCOTROL) 5 MG tablet, TAKE 0.5 TABLETS BY MOUTH 2 TIMES DAILY BEFORE MEAL., Disp: 30 tablet, Rfl: 5;  metFORMIN (GLUCOPHAGE) 500 MG tablet, TAKE 2 TABLETS IN THE MORNING TAKE 1 TABLET AT NOON TAKE 2 TABLETS IN THE EVENING, Disp: 150 tablet, Rfl: 5 Multiple Vitamin (MULTIVITAMIN) capsule, Take 1 capsule by mouth daily., Disp: , Rfl:    Social History: Reviewed -  reports that she has never smoked. She has never  used smokeless tobacco.  Objective Findings:  Vitals: Blood pressure 120/74, height 5\' 7"  (1.702 m), weight 164 lb (74.39 kg).  Physical Examination: General appearance - alert, well appearing, and in no distress, oriented to person, place, and time and normal appearing weight Mental status - alert, oriented to person, place, and time Abdomen - soft, nontender, nondistended, no masses or organomegaly Pelvic - normal external genitalia, vulva, vagina, cervix, uterus and adnexa, CERVIX: normal appearing cervix without discharge or lesions  Adnexa nontender Assessment & Plan:   A:  1. PMB  P:  1. Pelvic u/s in office. endometriual biopsy if thickness >5 mm.

## 2014-10-15 ENCOUNTER — Other Ambulatory Visit: Payer: Self-pay | Admitting: Obstetrics and Gynecology

## 2014-10-15 DIAGNOSIS — N950 Postmenopausal bleeding: Secondary | ICD-10-CM

## 2014-10-17 ENCOUNTER — Encounter: Payer: Self-pay | Admitting: Obstetrics and Gynecology

## 2014-10-17 ENCOUNTER — Other Ambulatory Visit: Payer: Self-pay | Admitting: Obstetrics and Gynecology

## 2014-10-17 ENCOUNTER — Ambulatory Visit (INDEPENDENT_AMBULATORY_CARE_PROVIDER_SITE_OTHER): Payer: BC Managed Care – PPO | Admitting: Obstetrics and Gynecology

## 2014-10-17 ENCOUNTER — Ambulatory Visit (INDEPENDENT_AMBULATORY_CARE_PROVIDER_SITE_OTHER): Payer: BC Managed Care – PPO

## 2014-10-17 VITALS — BP 138/80 | Ht 67.0 in | Wt 162.0 lb

## 2014-10-17 DIAGNOSIS — N950 Postmenopausal bleeding: Secondary | ICD-10-CM | POA: Insufficient documentation

## 2014-10-17 NOTE — Progress Notes (Signed)
Mary Vazquez is a 58 y.o. female presents today to review [place] reports followed by an endometrial biopsy.   Endometrial Biopsy: Patient given informed consent, signed copy in the chart, time out was performed. Time out taken. The patient was placed in the lithotomy position and the cervix brought into view with sterile speculum.  Portio of cervix cleansed x 2 with betadine swabs.  A tenaculum was placed in the anterior lip of the cervix. The uterus was sounded for depth of 6 cm,. Milex uterine Explora 3 mm was introduced to into the uterus, suction created,  and an endometrial sample was obtained. All equipment was removed and accounted for.   The patient tolerated the procedure well.    Patient given post procedure instructions.  Followup: by phone for results   This chart was scribed by Ludger Nutting, Medical Scribe, for Dr. Mallory Shirk on 10/17/14 at 11:33 AM. This chart was reviewed by Dr. Mallory Shirk for accuracy.

## 2014-10-17 NOTE — Progress Notes (Signed)
Patient ID: Mary Vazquez, female   DOB: Oct 29, 1956, 58 y.o.   MRN: [id number] Pt here today for [place] and go over results. Pt may also have an endo biopsy, consent was signed by all parties and procedure was explained.

## 2014-10-17 NOTE — Addendum Note (Signed)
Addended by: Farley Ly on: 10/17/2014 11:58 AM   Modules accepted: Orders

## 2014-10-24 ENCOUNTER — Encounter: Payer: Self-pay | Admitting: Obstetrics and Gynecology

## 2014-10-28 ENCOUNTER — Encounter: Payer: Self-pay | Admitting: Family Medicine

## 2015-01-10 ENCOUNTER — Other Ambulatory Visit: Payer: Self-pay | Admitting: Obstetrics and Gynecology

## 2015-01-10 DIAGNOSIS — Z1231 Encounter for screening mammogram for malignant neoplasm of breast: Secondary | ICD-10-CM

## 2015-01-14 ENCOUNTER — Ambulatory Visit (HOSPITAL_COMMUNITY)
Admission: RE | Admit: 2015-01-14 | Discharge: 2015-01-14 | Disposition: A | Discharge status: Home / Self Care | Payer: BLUE CROSS/BLUE SHIELD | Source: Ambulatory Visit | Attending: Obstetrics and Gynecology | Admitting: Obstetrics and Gynecology

## 2015-01-14 DIAGNOSIS — Z1231 Encounter for screening mammogram for malignant neoplasm of breast: Secondary | ICD-10-CM | POA: Diagnosis present

## 2015-02-08 ENCOUNTER — Other Ambulatory Visit: Payer: Self-pay | Admitting: Family Medicine

## 2015-03-06 ENCOUNTER — Telehealth: Payer: Self-pay | Admitting: Family Medicine

## 2015-03-06 DIAGNOSIS — Z13220 Encounter for screening for lipoid disorders: Secondary | ICD-10-CM

## 2015-03-06 DIAGNOSIS — Z79899 Other long term (current) drug therapy: Secondary | ICD-10-CM

## 2015-03-06 DIAGNOSIS — E119 Type 2 diabetes mellitus without complications: Secondary | ICD-10-CM

## 2015-03-06 DIAGNOSIS — I10 Essential (primary) hypertension: Secondary | ICD-10-CM

## 2015-03-06 NOTE — Telephone Encounter (Signed)
Needs bw orders for OV on 4/4   Last labs 01/08/14 Lip, Hep Func, BMP, Microalbumin urine, A1C

## 2015-03-11 ENCOUNTER — Other Ambulatory Visit: Payer: Self-pay | Admitting: Family Medicine

## 2015-03-14 NOTE — Telephone Encounter (Signed)
Rep all the same 

## 2015-03-14 NOTE — Telephone Encounter (Signed)
Were these bw orders needing to be done? Don't see they were  Put in at all?

## 2015-03-14 NOTE — Telephone Encounter (Signed)
[facility] 3/31 (BW orders are in)

## 2015-03-16 LAB — HEPATIC FUNCTION PANEL
ALT: 27 IU/L (ref 0–32)
AST: 30 IU/L (ref 0–40)
Albumin: 4.5 g/dL (ref 3.6–4.8)
Alkaline Phosphatase: 62 IU/L (ref 39–117)
BILIRUBIN TOTAL: 0.3 mg/dL (ref 0.0–1.2)
Bilirubin, Direct: 0.1 mg/dL (ref 0.00–0.40)
Total Protein: 6.8 g/dL (ref 6.0–8.5)

## 2015-03-16 LAB — BASIC METABOLIC PANEL
BUN/Creatinine Ratio: 14 (ref 11–26)
BUN: 14 mg/dL (ref 8–27)
CO2: 26 mmol/L (ref 18–29)
Calcium: 9.4 mg/dL (ref 8.7–10.3)
Chloride: 101 mmol/L (ref 97–108)
Creatinine, Ser: 0.97 mg/dL (ref 0.57–1.00)
GFR calc non Af Amer: 64 mL/min/{1.73_m2} (ref >59)
GFR, EST AFRICAN AMERICAN: 73 mL/min/{1.73_m2} (ref >59)
GLUCOSE: 163 mg/dL — AB (ref 65–99)
POTASSIUM: 4.3 mmol/L (ref 3.5–5.2)
Sodium: 143 mmol/L (ref 134–144)

## 2015-03-16 LAB — LIPID PANEL
Chol/HDL Ratio: 2.3 ratio units (ref 0.0–4.4)
Cholesterol, Total: 190 mg/dL (ref 100–199)
HDL: 82 mg/dL (ref >39)
LDL CALC: 96 mg/dL (ref 0–99)
TRIGLYCERIDES: 58 mg/dL (ref 0–149)
VLDL Cholesterol Cal: 12 mg/dL (ref 5–40)

## 2015-03-16 LAB — HEMOGLOBIN A1C
Est. average glucose Bld gHb Est-mCnc: 186 mg/dL
Hgb A1c MFr Bld: 8.1 % — ABNORMAL HIGH (ref 4.8–5.6)

## 2015-03-16 LAB — MICROALBUMIN, URINE: Microalbumin, Urine: <3 ug/mL (ref 0.0–17.0)

## 2015-03-18 ENCOUNTER — Encounter: Payer: Self-pay | Admitting: Family Medicine

## 2015-03-18 ENCOUNTER — Ambulatory Visit (INDEPENDENT_AMBULATORY_CARE_PROVIDER_SITE_OTHER): Payer: BLUE CROSS/BLUE SHIELD | Admitting: Family Medicine

## 2015-03-18 VITALS — BP 122/84 | Ht 67.0 in | Wt 161.0 lb

## 2015-03-18 DIAGNOSIS — I10 Essential (primary) hypertension: Secondary | ICD-10-CM | POA: Diagnosis not present

## 2015-03-18 DIAGNOSIS — E119 Type 2 diabetes mellitus without complications: Secondary | ICD-10-CM

## 2015-03-18 DIAGNOSIS — J4521 Mild intermittent asthma with (acute) exacerbation: Secondary | ICD-10-CM | POA: Diagnosis not present

## 2015-03-18 DIAGNOSIS — J683 Other acute and subacute respiratory conditions due to chemicals, gases, fumes and vapors: Secondary | ICD-10-CM | POA: Insufficient documentation

## 2015-03-18 MED ORDER — ALBUTEROL SULFATE HFA 108 (90 BASE) MCG/ACT IN AERS: 2.0000 | INHALATION_SPRAY | Freq: Four times a day (QID) | RESPIRATORY_TRACT | Status: DC | PRN

## 2015-03-18 MED ORDER — GLIPIZIDE 5 MG PO TABS
5.0000 mg | ORAL_TABLET | Freq: Two times a day (BID) | ORAL | Status: DC
Start: 2015-03-18 — End: 2015-04-10

## 2015-03-18 NOTE — Telephone Encounter (Signed)
Patient was seen in the office today. Blood work was done.

## 2015-03-18 NOTE — Progress Notes (Signed)
   Subjective:    Patient ID: Mary Vazquez, female    DOB: 02-02-1956, 59 y.o.   MRN: [id number]  Diabetes She presents for her follow-up diabetic visit. She has type 2 diabetes mellitus. Current diabetic treatment includes oral agent (dual therapy) (metformin and glipizide). She is compliant with treatment all of the time. She is following a diabetic diet. Exercise: exercises 5 days a week. Her breakfast blood glucose range is generally 110-130 mg/dl. She does not see a podiatrist.Eye exam is current.  A1C 8.1 on bloodwork 4/1/6.  Morn sugars h  [address]   Getting some allergy symtoms, has resumed claritin but on prn basis. Patient has ongoing substantial cough. Notes wheeziness and the chest. This is a new issue for her.  Compliant with blood pressure medicine. No obvious side effects. Try to cut down salt intake.   Exercising daily   pt states no other concerns today.  Results for orders placed or performed in visit on 03/06/15  Lipid panel  Result Value Ref Range   Cholesterol, Total 190 100 - 199 mg/dL   Triglycerides 58 0 - 149 mg/dL   HDL 82 >39 mg/dL   VLDL Cholesterol Cal 12 5 - 40 mg/dL   LDL Calculated 96 0 - 99 mg/dL   Chol/HDL Ratio 2.3 0.0 - 4.4 ratio units  Hepatic function panel  Result Value Ref Range   Total Protein 6.8 6.0 - 8.5 g/dL   Albumin 4.5 3.6 - 4.8 g/dL   Bilirubin Total 0.3 0.0 - 1.2 mg/dL   Bilirubin, Direct 0.10 0.00 - 0.40 mg/dL   Alkaline Phosphatase 62 39 - 117 IU/L   AST 30 0 - 40 IU/L   ALT 27 0 - 32 IU/L  Basic metabolic panel  Result Value Ref Range   Glucose 163 (H) 65 - 99 mg/dL   BUN 14 8 - 27 mg/dL   Creatinine, Ser 0.97 0.57 - 1.00 mg/dL   GFR calc non Af Amer 64 >59 mL/min/1.73   GFR calc Af Amer 73 >59 mL/min/1.73   BUN/Creatinine Ratio 14 11 - 26   Sodium 143 134 - 144 mmol/L   Potassium 4.3 3.5 - 5.2 mmol/L   Chloride 101 97 - 108 mmol/L   CO2 26 18 - 29 mmol/L   Calcium 9.4 8.7 - 10.3 mg/dL  Hemoglobin A1c    Result Value Ref Range   Hgb A1c MFr Bld 8.1 (H) 4.8 - 5.6 %   Est. average glucose Bld gHb Est-mCnc 186 mg/dL  Microalbumin, urine  Result Value Ref Range   Microalbum.,U,Random <3.0 0.0 - 17.0 ug/mL     Review of Systems No headache no chest pain no weight loss no weight    Objective:   Physical Exam Alert no acute distress. HEENT slight nasal congestion. Lungs bilateral wheezes mild heart regular in rhythm ankles without edema.       Assessment & Plan:  Impression 1 type 2 diabetes suboptimum discuss #2 hypertension good control discussed #3 reactive airways trigger impart by allergies new for patient discussed at length plan initiate albuterol 2 sprays 4 times a day when necessary. Use Claritin. Increase glipizide to 5 mg twice a day. Maintain enalapril recheck as scheduled [facility]

## 2015-04-02 ENCOUNTER — Encounter: Payer: Self-pay | Admitting: Family Medicine

## 2015-04-03 ENCOUNTER — Encounter: Payer: Self-pay | Admitting: Family Medicine

## 2015-04-03 NOTE — Telephone Encounter (Signed)
Patient stated that she wanted to try inhaler for cough first and will call back if she wants the tessalon perles sent in.

## 2015-04-07 ENCOUNTER — Other Ambulatory Visit: Payer: Self-pay | Admitting: Family Medicine

## 2015-04-10 ENCOUNTER — Emergency Department (HOSPITAL_COMMUNITY)
Admission: EM | Admit: 2015-04-10 | Discharge: 2015-04-10 | Disposition: A | Discharge status: Home / Self Care | Payer: BLUE CROSS/BLUE SHIELD | Attending: Emergency Medicine | Admitting: Emergency Medicine

## 2015-04-10 ENCOUNTER — Emergency Department (HOSPITAL_COMMUNITY): Payer: BLUE CROSS/BLUE SHIELD

## 2015-04-10 ENCOUNTER — Other Ambulatory Visit: Payer: Self-pay | Admitting: Family Medicine

## 2015-04-10 ENCOUNTER — Encounter (HOSPITAL_COMMUNITY): Payer: Self-pay | Admitting: Emergency Medicine

## 2015-04-10 DIAGNOSIS — W1849XA Other slipping, tripping and stumbling without falling, initial encounter: Secondary | ICD-10-CM | POA: Insufficient documentation

## 2015-04-10 DIAGNOSIS — I10 Essential (primary) hypertension: Secondary | ICD-10-CM | POA: Insufficient documentation

## 2015-04-10 DIAGNOSIS — Y998 Other external cause status: Secondary | ICD-10-CM | POA: Diagnosis not present

## 2015-04-10 DIAGNOSIS — S82841A Displaced bimalleolar fracture of right lower leg, initial encounter for closed fracture: Secondary | ICD-10-CM | POA: Diagnosis not present

## 2015-04-10 DIAGNOSIS — Y9301 Activity, walking, marching and hiking: Secondary | ICD-10-CM | POA: Diagnosis not present

## 2015-04-10 DIAGNOSIS — E119 Type 2 diabetes mellitus without complications: Secondary | ICD-10-CM | POA: Insufficient documentation

## 2015-04-10 DIAGNOSIS — Y9289 Other specified places as the place of occurrence of the external cause: Secondary | ICD-10-CM | POA: Diagnosis not present

## 2015-04-10 DIAGNOSIS — S8261XA Displaced fracture of lateral malleolus of right fibula, initial encounter for closed fracture: Secondary | ICD-10-CM | POA: Diagnosis not present

## 2015-04-10 DIAGNOSIS — S99911A Unspecified injury of right ankle, initial encounter: Secondary | ICD-10-CM | POA: Diagnosis present

## 2015-04-10 DIAGNOSIS — Z79899 Other long term (current) drug therapy: Secondary | ICD-10-CM | POA: Diagnosis not present

## 2015-04-10 MED ORDER — OXYCODONE-ACETAMINOPHEN 5-325 MG PO TABS: 1.0000 | ORAL_TABLET | Freq: Four times a day (QID) | ORAL | Status: DC | PRN

## 2015-04-10 MED ORDER — OXYCODONE-ACETAMINOPHEN 5-325 MG PO TABS
1.0000 | ORAL_TABLET | Freq: Once | ORAL | Status: AC
  Administered 2015-04-10: 1 via ORAL
  Filled 2015-04-10: qty 1

## 2015-04-10 MED ORDER — ONDANSETRON HCL 4 MG PO TABS
4.0000 mg | ORAL_TABLET | Freq: Once | ORAL | Status: AC
  Administered 2015-04-10: 4 mg via ORAL
  Filled 2015-04-10: qty 1

## 2015-04-10 MED ORDER — GLIPIZIDE 5 MG PO TABS: 5.0000 mg | ORAL_TABLET | Freq: Two times a day (BID) | ORAL | Status: DC

## 2015-04-10 MED ORDER — DICLOFENAC SODIUM 75 MG PO TBEC: 75.0000 mg | DELAYED_RELEASE_TABLET | Freq: Two times a day (BID) | ORAL | Status: DC

## 2015-04-10 NOTE — ED Notes (Signed)
Pt verbalized understanding of no driving and to use caution within 4 hours of taking pain meds due to meds cause drowsiness 

## 2015-04-10 NOTE — Discharge Instructions (Signed)
Your x-ray shows your ankle is broken in at least 2 places. Please keep your foot and ankle elevated above your waist. Please apply ice. Please usual walker when getting around. Please see Dr. Aline Brochure in the office on Thursday, May 5. Please use diclofenac 2 times daily with food. May use Percocet for more severe pain. Percocet may cause drowsiness, and/or constipation. Please use this medication with caution. Ankle Fracture A fracture is a break in a bone. A cast or splint may be used to protect the ankle and heal the break. Sometimes, surgery is needed. HOME CARE  Use crutches as told by your doctor. It is very important that you use your crutches correctly.  Do not put weight or pressure on the injured ankle until told by your doctor.  Keep your ankle raised (elevated) when sitting or lying down.  Apply ice to the ankle:  Put ice in a plastic bag.  Place a towel between your cast and the bag.  Leave the ice on for 20 minutes, 2-3 times a day.  If you have a plaster or fiberglass cast:  Do not try to scratch under the cast with any objects.  Check the skin around the cast every day. You may put lotion on red or sore areas.  Keep your cast dry and clean.  If you have a plaster splint:  Wear the splint as told by your doctor.  You can loosen the elastic around the splint if your toes get numb, tingle, or turn cold or blue.  Do not put pressure on any part of your cast or splint. It may break. Rest your plaster splint or cast only on a pillow the first 24 hours until it is fully hardened.  Cover your cast or splint with a plastic bag during showers.  Do not lower your cast or splint into water.  Take medicine as told by your doctor.  Do not drive until your doctor says it is safe.  Follow-up with your doctor as told. It is very important that you go to your follow-up visits. GET HELP IF: The swelling and discomfort gets worse.  GET HELP RIGHT AWAY IF:   Your splint or  cast breaks.  You continue to have very bad pain.  You have new pain or swelling after your splint or cast was put on.  Your skin or toes below the injured ankle:  Turn blue or gray.  Feel cold, numb, or you cannot feel them.  There is a bad smell or yellowish white fluid (pus) coming from under the splint or cast. MAKE SURE YOU:   Understand these instructions.  Will watch your condition.  Will get help right away if you are not doing well or get worse. Document Released: 09/27/2009 Document Revised: 09/20/2013 Document Reviewed: 06/29/2013 [facility] Patient Information 2015 [place], [place]. This information is not intended to replace advice given to you by your health care provider. Make sure you discuss any questions you have with your health care provider.

## 2015-04-10 NOTE — ED Notes (Signed)
H. Bryant, PA at bedside. 

## 2015-04-10 NOTE — Addendum Note (Signed)
Addended by: Dairl Ponder on: 04/10/2015 02:20 PM   Modules accepted: Orders

## 2015-04-10 NOTE — Consult Note (Signed)
Reason for Consult: RIGHT ANKLE FRACTURE AND PAIN  Referring Physician: DR Christy Gentles ANS PA [facility]  Mary Vazquez is an 59 y.o. female.  HPI: 54 [place] WITH PAIN SWELLING AND PAINFUL WEIGHT BEARING OVER THE MEDIAL AND LATERAL ASPECTS OF THE RIGHT ANKLE   Past Medical History  Diagnosis Date  . Diabetes   . Hypertension   . PONV (postoperative nausea and vomiting)     Past Surgical History  Procedure Laterality Date  . Colonoscopy   08/08/2007    SLF:  A 5-mm sessile descending colon polyp removed via cold forceps.Normal retroflexed view of the rectum/ Small internal hemorrhoids, PATH: tubular adenoma  . Tubal ligation    . Colonoscopy  01/09/2013    Procedure: COLONOSCOPY;  Surgeon: Danie Binder, MD;  Location[facility];  Service: Endoscopy;  Laterality: N/A;  8:30    Family History  Problem Relation Age of Onset  . Colon cancer Neg Hx   . Cancer Father     prostate  . Diabetes Sister   . Diabetes Paternal Aunt   . Diabetes Maternal Grandmother     Social History:  reports that she has never smoked. She has never used smokeless tobacco. She reports that she does not drink alcohol or use illicit drugs.  Allergies:  Allergies  Allergen Reactions  . Septra [Sulfamethoxazole-Trimethoprim]     Medications: I have reviewed the patient's current medications.  No results found for this or any previous visit (from the past 48 hour(s)).  Dg Ankle Complete Right  04/10/2015   CLINICAL DATA:  Tripped and fall walking in [place]. Lateral ankle pain and swelling. Initial encounter.  EXAM: RIGHT ANKLE - COMPLETE 3+ VIEW  COMPARISON:  None.  FINDINGS: A mildly displaced oblique fracture the distal fibula is seen extending just above the level of the tibial plafond.  A nondisplaced fracture is also seen involving the posterior malleolus of the distal tibia on the lateral projection.  Tiny ossific densities are seen along the inferior aspect  of the medial malleolus, consistent with tiny avulsion fracture fragments.  The talus remains centered within the ankle mortise. Anterior and lateral soft tissue swelling is noted.  IMPRESSION: Mildly displaced fractures involving the distal fibula and posterior malleolus of the tibia.  Tiny avulsion fracture fragments along the inferior aspect of the medial malleolus.   Electronically Signed   By: Earle Gell M.D.   On: 04/10/2015 11:52    Review of Systems  All other systems reviewed and are negative.  Blood pressure 174/96, pulse 103, temperature 98.1 F (36.7 C), temperature source Oral, resp. rate 18, height 5\' 7"  (1.702 m), weight 160 lb (72.576 kg), SpO2 100 %. Physical Exam  Vitals reviewed. Constitutional: She is oriented to person, place, and time. She appears well-developed and well-nourished. No distress.  Eyes: Conjunctivae are normal. Right eye exhibits no discharge. Left eye exhibits no discharge. No scleral icterus.  Neck: Neck supple. No tracheal deviation present.  Cardiovascular: Normal rate.   Respiratory: No stridor.  Musculoskeletal:  LEFT ANKLE SWELLING LATERAL  TENDERNESS MEDIAL LATERAL  DEFORMITY - NONE  ROM LIMITED BY PAIN  MOTOR SHE CAN MOVE HER TOES   Neurological: She is alert and oriented to person, place, and time. She has normal reflexes. She exhibits normal muscle tone.  NORMAL SENSATION   Skin: Skin is warm. She is not diaphoretic. No erythema.  Psychiatric: She has a normal mood and affect. Her behavior is  normal. Judgment and thought content normal.    Assessment/Plan: I INTERPRET THE IMAGE AS WEBER B MIN DISPLACED FIBULA ANKLE MORTISE INTACT   I presented the operative and nonoperative treatment options to the patient and her family members present sister and husband. This included but was not limited to nonoperative treatment and serial weekly x-rays to see if there is any shift in the ankle joint mortise or the fracture itself versus immediate  surgery the risk related to diabetes and skin incisions in the foot and ankle.  They opted for nonoperative treatment  Placement of posterior splint to allow swelling and she will see me next Thursday for x-rays.  Mary Vazquez 04/10/2015, 1:36 PM

## 2015-04-10 NOTE — ED Provider Notes (Signed)
CSN: [id number]     Arrival date & time 04/10/15  1102 History   First MD Initiated Contact with Patient 04/10/15 1159     Chief Complaint  Patient presents with  . Ankle Pain     (Consider location/radiation/quality/duration/timing/severity/associated sxs/prior Treatment) HPI Comments:  Injured the right ankle while walking this AM on the walking trail. Patient states she somehow lost her footing, inhale. She has not had any previous operations or procedures involving the right ankle. She states she noted immediate swelling and pain. Can not bear weight on it. Patient denies being on any anticoagulation medications. She has no history of bleeding disorder. The history is provided by the patient.    Past Medical History  Diagnosis Date  . Diabetes   . Hypertension   . PONV (postoperative nausea and vomiting)    Past Surgical History  Procedure Laterality Date  . Colonoscopy   08/08/2007    SLF:  A 5-mm sessile descending colon polyp removed via cold forceps.Normal retroflexed view of the rectum/ Small internal hemorrhoids, PATH: tubular adenoma  . Tubal ligation    . Colonoscopy  01/09/2013    Procedure: COLONOSCOPY;  Surgeon: Danie Binder, MD;  [facility];  Service: Endoscopy;  Laterality: N/A;  8:30   Family History  Problem Relation Age of Onset  . Colon cancer Neg Hx   . Cancer Father     prostate  . Diabetes Sister   . Diabetes Paternal Aunt   . Diabetes Maternal Grandmother    History  Substance Use Topics  . Smoking status: Never Smoker   . Smokeless tobacco: Never Used  . Alcohol Use: No   OB History    No data available     Review of Systems  Constitutional: Negative for activity change.       All ROS Neg except as noted in HPI  HENT: Negative for nosebleeds.   Eyes: Negative for photophobia and discharge.  Respiratory: Negative for cough, shortness of breath and wheezing.   Cardiovascular: Negative for chest pain and palpitations.   Gastrointestinal: Negative for abdominal pain and blood in stool.  Genitourinary: Negative for dysuria, frequency and hematuria.  Musculoskeletal: Negative for back pain, arthralgias and neck pain.  Skin: Negative.   Neurological: Negative for dizziness, seizures and speech difficulty.  Psychiatric/Behavioral: Negative for hallucinations and confusion.      Allergies  Septra  Home Medications   Prior to Admission medications   Medication Sig Start Date End Date Taking? Authorizing Provider  albuterol (PROVENTIL HFA;VENTOLIN HFA) 108 (90 BASE) MCG/ACT inhaler Inhale 2 puffs into the lungs every 6 (six) hours as needed for wheezing or shortness of breath. 03/18/15   Mikey Kirschner, MD  enalapril (VASOTEC) 10 MG tablet TAKE 1 TABLET EVERY MORNING 08/23/14   Mikey Kirschner, MD  glipiZIDE (GLUCOTROL) 5 MG tablet Take 1 tablet (5 mg total) by mouth 2 (two) times daily. 03/18/15   Mikey Kirschner, MD  glipiZIDE (GLUCOTROL) 5 MG tablet TAKE 1/2 TABLETS BY MOUTH 2 TIMES DAILY BEFORE MEAL. 04/08/15   Mikey Kirschner, MD  metFORMIN (GLUCOPHAGE) 500 MG tablet TAKE 2 TABLETS IN THE MORNING TAKE 1 TABLET AT NOON TAKE 2 TABLETS IN THE EVENING 08/23/14   Mikey Kirschner, MD  Multiple Vitamin (MULTIVITAMIN) capsule Take 1 capsule by mouth daily.    Historical Provider, MD   BP 174/96 mmHg  Pulse 103  Temp(Src) 98.1 F (36.7 C) (Oral)  Resp 18  Ht  5\' 7"  (1.702 m)  Wt 160 lb (72.576 kg)  BMI 25.05 kg/m2  SpO2 100% Physical Exam  Constitutional: She is oriented to person, place, and time. She appears well-developed and well-nourished.  Non-toxic appearance.  HENT:  Head: Normocephalic.  Right Ear: Tympanic membrane and external ear normal.  Left Ear: Tympanic membrane and external ear normal.  Eyes: EOM and lids are normal. Pupils are equal, round, and reactive to light.  Neck: Normal range of motion. Neck supple. Carotid bruit is not present.  Cardiovascular: Normal rate, regular rhythm,  normal heart sounds, intact distal pulses and normal pulses.   Pulmonary/Chest: Breath sounds normal. No respiratory distress.  Abdominal: Soft. Bowel sounds are normal. There is no tenderness. There is no guarding.  Musculoskeletal: She exhibits tenderness.       Right ankle: She exhibits decreased range of motion, swelling and deformity. Tenderness. Medial malleolus tenderness found. Achilles tendon normal.  FROM of the right hip and knee. Swelling of the right ankle and distal tibia area. Achilles intact. DP 2+, Posterior Tib 2+.   Lymphadenopathy:       Head (right side): No submandibular adenopathy present.       Head (left side): No submandibular adenopathy present.    She has no cervical adenopathy.  Neurological: She is alert and oriented to person, place, and time. She has normal strength. No cranial nerve deficit or sensory deficit.  Skin: Skin is warm and dry.  Psychiatric: She has a normal mood and affect. Her speech is normal.  Nursing note and vitals reviewed.   ED Course  Spoke with Dr Aline Brochure. He will see pt in the ED.  Procedures (including critical care time) Labs Review Labs Reviewed - No data to display  Imaging Review Dg Ankle Complete Right  04/10/2015   CLINICAL DATA:  Tripped and fall walking in [place]. Lateral ankle pain and swelling. Initial encounter.  EXAM: RIGHT ANKLE - COMPLETE 3+ VIEW  COMPARISON:  None.  FINDINGS: A mildly displaced oblique fracture the distal fibula is seen extending just above the level of the tibial plafond.  A nondisplaced fracture is also seen involving the posterior malleolus of the distal tibia on the lateral projection.  Tiny ossific densities are seen along the inferior aspect of the medial malleolus, consistent with tiny avulsion fracture fragments.  The talus remains centered within the ankle mortise. Anterior and lateral soft tissue swelling is noted.  IMPRESSION: Mildly displaced fractures involving the distal fibula and posterior  malleolus of the tibia.  Tiny avulsion fracture fragments along the inferior aspect of the medial malleolus.   Electronically Signed   By: Earle Gell M.D.   On: 04/10/2015 11:52     EKG Interpretation None      MDM Blood pressure is 174/96, otherwise vitals within normal limits. Pulse oximetry 100% on room air. Within normal limits by my interpretation. X-ray of the right ankle reveals a mildly displaced fracture of the distal fibula and the posterior malleolus of the tibia. Is also some question of some tiny avulsion fractures involving the medial malleolus.  Exam and Xray results discussed with the patient in terms she understands.  Case discussed with Dr. Aline Brochure. He came to the emergency department to evaluate the patient. The plan at this time is for the patient be placed in a posterior splint. A walker has been ordered for the patient. Prescription for Percocet one every 6 hours given to the patient. The patient will be seen in the office to  discuss additional plans for management.    Final diagnoses:  None    *I have reviewed nursing notes, vital signs, and all appropriate lab and imaging results for this patient.**    Lily Kocher, PA-C 04/11/15 2129  Ripley Fraise, MD 04/12/15 [phone]

## 2015-04-10 NOTE — ED Notes (Signed)
Pt states that she was walking and tripped and right ankle rolled.  C/o pain and swelling to it.  INability to bear weight.

## 2015-04-18 ENCOUNTER — Ambulatory Visit (INDEPENDENT_AMBULATORY_CARE_PROVIDER_SITE_OTHER): Payer: BLUE CROSS/BLUE SHIELD | Admitting: Orthopedic Surgery

## 2015-04-18 ENCOUNTER — Ambulatory Visit (INDEPENDENT_AMBULATORY_CARE_PROVIDER_SITE_OTHER): Payer: BLUE CROSS/BLUE SHIELD

## 2015-04-18 ENCOUNTER — Encounter: Payer: Self-pay | Admitting: Orthopedic Surgery

## 2015-04-18 VITALS — BP 141/98 | Ht 67.0 in | Wt 160.0 lb

## 2015-04-18 DIAGNOSIS — S82891A Other fracture of right lower leg, initial encounter for closed fracture: Secondary | ICD-10-CM

## 2015-04-18 NOTE — Progress Notes (Signed)
Patient ID: Mary Vazquez, female   DOB: 12-15-1955, 59 y.o.   MRN: [id number]  Chief Complaint  Patient presents with  . Ankle Injury    hospital follow up, Rt ankle fx, DOI 04/10/15   Right ankle fracture in this diabetic opted for nonoperative treatment.  Removal of posterior splint x-ray out of plaster. X-ray completed previous film shows no change in position of the fracture. Ankle mortise intact. Medial clear space normal. Ankle congruency normal.  She still has some mild swelling in the foot.  We placed in a short leg well-padded nonweightbearing cast  She says her pain is under control with minimal use of oxycodone.  X-ray in the cast one week. I would like to start her on an aggressive ankle range of motion program in a range of motion boot at 3 weeks if her x-rays look good.

## 2015-04-19 ENCOUNTER — Encounter: Payer: Self-pay | Admitting: Family Medicine

## 2015-04-19 ENCOUNTER — Telehealth: Payer: BLUE CROSS/BLUE SHIELD | Admitting: Nurse Practitioner

## 2015-04-19 DIAGNOSIS — J069 Acute upper respiratory infection, unspecified: Secondary | ICD-10-CM

## 2015-04-19 NOTE — Progress Notes (Signed)
E visit for Flu like symptoms   We are sorry that you are not feeling well.  Here is how we plan to help! Based on what you have shared with me it looks like you may have flu-like symptoms that should be watched but do not seem to indicate anti-viral treatment.  Influenza or "the flu" is   an infection caused by a respiratory virus. The flu virus is highly contagious and persons who did not receive their yearly flu vaccination may "catch" the flu from close contact.  We have anti-viral medications to treat the viruses that cause this infection. They are not a "cure" and only shorten the course of the infection. These prescriptions are most effective when they are given within the first 2 days of "flu" symptoms. Antiviral medication are indicated if you have a high risk of complications from the flu. You should  also consider an antiviral medication if you are in close contact with someone who is at risk. These medications can help patients avoid complications from the flu  but have side effects that you should know. Possible side effects from Tamiflu or oseltamivir include nausea, vomiting, diarrhea, dizziness, headaches, eye redness, sleep problems or other respiratory symptoms. You should not take Tamiflu if you have an allergy to oseltamivir or any to the ingredients in Tamiflu.  Based upon your symptoms and potential risk factors I recommend that you follow the flu symptoms recommendation that I have listed below.  ANYONE WHO HAS FLU SYMPTOMS SHOULD: . Stay home. The flu is highly contagious and going out or to work exposes others! . Be sure to drink plenty of fluids. Water is fine as well as fruit juices, sodas and electrolyte beverages. You may want to stay away from caffeine or alcohol. If you are nauseated, try taking small sips of liquids. How do you know if you are getting enough fluid? Your urine should be a pale yellow or almost colorless. . Get rest. . Taking a steamy shower or using a  humidifier may help nasal congestion and ease sore throat pain. Using a saline nasal spray works much the same way. . Cough drops, hard candies and sore throat lozenges may ease your cough. . Line up a caregiver. Have someone check on you regularly.   GET HELP RIGHT AWAY IF: . You cannot keep down liquids or your medications. . You become short of breath . Your fell like you are going to pass out or loose consciousness. . Your symptoms persist after you have completed your treatment plan MAKE SURE YOU   Understand these instructions.  Will watch your condition.  Will get help right away if you are not doing well or get worse.  Your e-visit answers were reviewed by a board certified advanced clinical practitioner to complete your personal care plan.  Depending on the condition, your plan could have included both over the counter or prescription medications.  If there is a problem please reply  once you have received a response from your provider.  Your safety is important to [place].  If you have drug allergies check your prescription carefully.    You can use MyChart to ask questions about today's visit, request a non-urgent call back, or ask for a work or school excuse.  You will get an e-mail in the next two days asking about your experience.  I hope that your e-visit has been valuable and will speed your recovery. Thank you for using e-visits.

## 2015-04-22 ENCOUNTER — Other Ambulatory Visit: Payer: Self-pay | Admitting: Family Medicine

## 2015-04-22 MED ORDER — GLIPIZIDE 5 MG PO TABS: 5.0000 mg | ORAL_TABLET | Freq: Two times a day (BID) | ORAL | Status: DC

## 2015-04-22 NOTE — Addendum Note (Signed)
Addended by: Dairl Ponder on: 04/22/2015 12:10 PM   Modules accepted: Orders

## 2015-04-25 ENCOUNTER — Ambulatory Visit (INDEPENDENT_AMBULATORY_CARE_PROVIDER_SITE_OTHER): Payer: BLUE CROSS/BLUE SHIELD

## 2015-04-25 ENCOUNTER — Encounter: Payer: Self-pay | Admitting: Orthopedic Surgery

## 2015-04-25 ENCOUNTER — Ambulatory Visit (INDEPENDENT_AMBULATORY_CARE_PROVIDER_SITE_OTHER): Payer: BLUE CROSS/BLUE SHIELD | Admitting: Orthopedic Surgery

## 2015-04-25 VITALS — BP 132/87 | Ht 67.0 in | Wt 160.0 lb

## 2015-04-25 DIAGNOSIS — S82841D Displaced bimalleolar fracture of right lower leg, subsequent encounter for closed fracture with routine healing: Secondary | ICD-10-CM

## 2015-04-25 DIAGNOSIS — S82891A Other fracture of right lower leg, initial encounter for closed fracture: Secondary | ICD-10-CM

## 2015-04-25 NOTE — Progress Notes (Signed)
Chief Complaint  Patient presents with  . Follow-up    04/10/2015: 15 days post injury     Bimalleolar ankle fracture lateral malleolus posterior malleolus  Short leg cast nonweightbearing in this middle-aged diabetic  Compliant, mild discomfort, today's x-ray shows ankle mortise intact  Come back one week x-ray out of plaster conversion to Cam Walker weightbearing as tolerated

## 2015-04-25 NOTE — Patient Instructions (Signed)
1 more week no weight bearing

## 2015-05-02 ENCOUNTER — Ambulatory Visit: Payer: BLUE CROSS/BLUE SHIELD

## 2015-05-02 ENCOUNTER — Ambulatory Visit (HOSPITAL_COMMUNITY)
Admission: RE | Admit: 2015-05-02 | Discharge: 2015-05-02 | Disposition: A | Discharge status: Home / Self Care | Payer: BLUE CROSS/BLUE SHIELD | Source: Ambulatory Visit | Attending: Orthopedic Surgery | Admitting: Orthopedic Surgery

## 2015-05-02 ENCOUNTER — Encounter: Payer: Self-pay | Admitting: *Deleted

## 2015-05-02 ENCOUNTER — Encounter: Payer: Self-pay | Admitting: Orthopedic Surgery

## 2015-05-02 ENCOUNTER — Ambulatory Visit (INDEPENDENT_AMBULATORY_CARE_PROVIDER_SITE_OTHER): Payer: Self-pay | Admitting: Orthopedic Surgery

## 2015-05-02 VITALS — BP 148/98 | Ht 67.0 in | Wt 160.0 lb

## 2015-05-02 DIAGNOSIS — S82891D Other fracture of right lower leg, subsequent encounter for closed fracture with routine healing: Secondary | ICD-10-CM | POA: Insufficient documentation

## 2015-05-02 DIAGNOSIS — S82891A Other fracture of right lower leg, initial encounter for closed fracture: Secondary | ICD-10-CM

## 2015-05-02 DIAGNOSIS — S82841D Displaced bimalleolar fracture of right lower leg, subsequent encounter for closed fracture with routine healing: Secondary | ICD-10-CM

## 2015-05-02 NOTE — Progress Notes (Signed)
Patient ID: Mary Vazquez, female   DOB: March 16, 1956, 59 y.o.   MRN: [id number] Chief Complaint  Patient presents with  . Follow-up    1 week recheck right ankle DOI 04/10/15   Encounter Diagnoses  Name Primary?  Marland Kitchen Ankle fracture, right   . Bimalleolar ankle fracture, right, closed, with routine healing, subsequent encounter Yes   BP 148/98 mmHg  Ht 5\' 7"  (1.702 m)  Wt 160 lb (72.576 kg)  BMI 25.05 kg/m2 This is a three-week follow-up x-ray She continues to do well. We took her x-rays and her fracture is stable. She was placed in a Cam Walker advised to start range of motion exercises recession today 20 plantar flexion dorsiflexion repetitions. Weight-bear as tolerated. Return 3 weeks x-ray.

## 2015-05-02 NOTE — Patient Instructions (Signed)
Elevate as needed Take boot off twice daily and do exercises

## 2015-05-06 ENCOUNTER — Other Ambulatory Visit: Payer: Self-pay | Admitting: Family Medicine

## 2015-05-10 LAB — HM DIABETES EYE EXAM

## 2015-05-14 ENCOUNTER — Encounter: Payer: Self-pay | Admitting: Family Medicine

## 2015-05-14 MED ORDER — GLIPIZIDE 5 MG PO TABS: 10.0000 mg | ORAL_TABLET | Freq: Two times a day (BID) | ORAL | Status: DC

## 2015-05-14 NOTE — Telephone Encounter (Signed)
Discussed with patient. Med sent in electronically to pharmacy. Patient scheduled follow up office visit in one month.

## 2015-05-27 ENCOUNTER — Ambulatory Visit (INDEPENDENT_AMBULATORY_CARE_PROVIDER_SITE_OTHER): Payer: BLUE CROSS/BLUE SHIELD | Admitting: Orthopedic Surgery

## 2015-05-27 ENCOUNTER — Ambulatory Visit (INDEPENDENT_AMBULATORY_CARE_PROVIDER_SITE_OTHER): Payer: BLUE CROSS/BLUE SHIELD

## 2015-05-27 ENCOUNTER — Telehealth: Payer: Self-pay | Admitting: Orthopedic Surgery

## 2015-05-27 VITALS — BP 141/91 | Ht 67.0 in | Wt 160.0 lb

## 2015-05-27 DIAGNOSIS — S82891A Other fracture of right lower leg, initial encounter for closed fracture: Secondary | ICD-10-CM

## 2015-05-27 DIAGNOSIS — S82841D Displaced bimalleolar fracture of right lower leg, subsequent encounter for closed fracture with routine healing: Secondary | ICD-10-CM

## 2015-05-27 NOTE — Telephone Encounter (Signed)
Routing to Dr Harrison 

## 2015-05-27 NOTE — Telephone Encounter (Signed)
Patient is calling asking does she need to continue using the walker with the new boot on, she forgot to ask that question this morning, please advise?

## 2015-05-27 NOTE — Telephone Encounter (Signed)
Patient is aware 

## 2015-05-27 NOTE — Telephone Encounter (Signed)
no

## 2015-05-27 NOTE — Telephone Encounter (Signed)
CALLED PATIENT, NO ANSWER °

## 2015-05-27 NOTE — Progress Notes (Signed)
Chief Complaint  Patient presents with  . Follow-up    3 week recheck on right ankle fracture with xray, DOI 04-10-15.    6 weeks since her original fracture +5 days she's doing well no pain at the fracture site no tenderness at the fracture site x-ray shows fracture is healing appropriately with intact mortise view  Patient is placed in an Aircast weight-bear as tolerated come back in 4 weeks for final x-ray

## 2015-05-30 ENCOUNTER — Encounter: Payer: Self-pay | Admitting: Family Medicine

## 2015-06-08 ENCOUNTER — Other Ambulatory Visit: Payer: Self-pay | Admitting: Family Medicine

## 2015-06-11 ENCOUNTER — Other Ambulatory Visit: Payer: Self-pay | Admitting: Family Medicine

## 2015-06-14 ENCOUNTER — Ambulatory Visit (INDEPENDENT_AMBULATORY_CARE_PROVIDER_SITE_OTHER): Payer: BLUE CROSS/BLUE SHIELD | Admitting: Family Medicine

## 2015-06-14 ENCOUNTER — Encounter: Payer: Self-pay | Admitting: Family Medicine

## 2015-06-14 VITALS — BP 120/82 | Ht 67.0 in

## 2015-06-14 DIAGNOSIS — E119 Type 2 diabetes mellitus without complications: Secondary | ICD-10-CM

## 2015-06-14 LAB — POCT GLYCOSYLATED HEMOGLOBIN (HGB A1C): Hemoglobin A1C: 7.1

## 2015-06-14 MED ORDER — GLIPIZIDE 5 MG PO TABS: 10.0000 mg | ORAL_TABLET | Freq: Two times a day (BID) | ORAL | Status: DC

## 2015-06-14 MED ORDER — ALBUTEROL SULFATE HFA 108 (90 BASE) MCG/ACT IN AERS: 2.0000 | INHALATION_SPRAY | Freq: Four times a day (QID) | RESPIRATORY_TRACT | Status: DC | PRN

## 2015-06-14 MED ORDER — ENALAPRIL MALEATE 10 MG PO TABS: 10.0000 mg | ORAL_TABLET | Freq: Every morning | ORAL | Status: DC

## 2015-06-14 MED ORDER — PROPYLENE GLYCOL 0.6 % OP SOLN: 1.0000 [drp] | Freq: Every day | OPHTHALMIC | Status: DC

## 2015-06-14 MED ORDER — METFORMIN HCL 500 MG PO TABS: ORAL_TABLET | ORAL | Status: DC

## 2015-06-14 NOTE — Progress Notes (Signed)
   Subjective:    Patient ID: Mary Vazquez, female    DOB: [birth year], 59 y.o.   MRN: [id number]  Diabetes She presents for her follow-up diabetic visit. She has type 2 diabetes mellitus. Hypoglycemia symptoms include tremors. There are no diabetic associated symptoms. Current diabetic treatment includes oral agent (monotherapy) (Metformin). She is compliant with treatment all of the time. She has not had a previous visit with a dietitian. Exercise: Patient walks 5 days a week  She does not see a podiatrist.Eye exam is current.   Patient eye exam May 10, 2015. Patient states eyes were negative for retinopathy.   Patient states no other concerns this visit.  Results for orders placed or performed in visit on 06/14/15  POCT glycosylated hemoglobin (Hb A1C)  Result Value Ref Range   Hemoglobin A1C 7.1   HM DIABETES EYE EXAM  Result Value Ref Range   HM Diabetic Eye Exam No Retinopathy No Retinopathy   Just released to drive last wk,  Improving mobility  159 averaging morn numbers   Patient claims compliance with blood pressure medication. Watching salt intake. Does not miss a dose.  Has had slow improvement from ankle fracture. See orthopedist note.  Patient reports wheezing overall considerably improved. Still happens on occasion but rare.  Morn numbers mostly good, up a bit recently     Review of Systems  Neurological: Positive for tremors.   no headache no chest pain no back pain no abdominal pain no change in bowel habits     Objective:   Physical Exam Alert vitals stable blood pressure good on repeat HEENT normal. Pharynx normal neck supple. Lungs clear. Heart regular in rhythm. Ankles without edema.       Assessment & Plan:  Impression 1 type 2 diabetes control good tone not perfect discuss. #2 hypertension also good control. #3 reactive airways clinically stable number for ankle pain discussed slowly improving plan diet discussed. Exercise discussed. Medications  refilled. Recheck in 6 months. WSL

## 2015-06-24 ENCOUNTER — Ambulatory Visit (INDEPENDENT_AMBULATORY_CARE_PROVIDER_SITE_OTHER): Payer: BLUE CROSS/BLUE SHIELD

## 2015-06-24 ENCOUNTER — Ambulatory Visit (INDEPENDENT_AMBULATORY_CARE_PROVIDER_SITE_OTHER): Payer: Self-pay | Admitting: Orthopedic Surgery

## 2015-06-24 VITALS — BP 146/98 | Ht 67.0 in | Wt 160.0 lb

## 2015-06-24 DIAGNOSIS — S82891A Other fracture of right lower leg, initial encounter for closed fracture: Secondary | ICD-10-CM | POA: Diagnosis not present

## 2015-06-24 NOTE — Progress Notes (Signed)
Patient ID: Mary Vazquez, female   DOB: 12/04/1956, 59 y.o.   MRN: [id number] FRACTURE CARE   Patient ID: Mary Vazquez, female   DOB: Aug 21, 1956, 59 y.o.   MRN: [id number]  Chief Complaint  Patient presents with  . Follow-up    1 month follow up + xray right ankle fx, DOI 04/10/15    DX  Encounter Diagnosis  Name Primary?  Marland Kitchen Ankle fracture, right Yes     TREATMENT cast followed by brace followed by Aircast  PAIN MEDS: Tylenol  WEIGHT BEARING STATUS as tolerated  XRAYS  Radiology report dictated by Dr. Aline Brochure previous films used for comparison  3 views of the right ankle  The ankle mortise is intact the lateral malleolus fracture has healed with minimal displacement   Impression healed right ankle fracture   EXAM no tenderness mild stiffness in dorsiflexion eversion inversion but no pain no swelling ambulates with the foot slightly turned out.  BP 146/98 mmHg  Ht 5\' 7"  (1.702 m)  Wt 160 lb (72.576 kg)  BMI 25.05 kg/m2      ASSESSMENT AND PLAN   Gradually return to normal activities weightbearing as tolerated   Released  After a couple of weeks if she feels needed we will schedule her physical therapy without an office visit and then see her after the therapy visit (3 times a week 4 weeks)

## 2015-08-29 ENCOUNTER — Other Ambulatory Visit: Payer: Self-pay | Admitting: Family Medicine

## 2015-08-30 ENCOUNTER — Other Ambulatory Visit: Payer: Self-pay | Admitting: Family Medicine

## 2015-09-16 ENCOUNTER — Ambulatory Visit: Payer: BLUE CROSS/BLUE SHIELD | Admitting: Family Medicine

## 2015-10-22 ENCOUNTER — Other Ambulatory Visit: Payer: Self-pay | Admitting: Family Medicine

## 2015-10-24 ENCOUNTER — Other Ambulatory Visit: Payer: Self-pay | Admitting: Family Medicine

## 2015-11-18 ENCOUNTER — Encounter: Payer: Self-pay | Admitting: Family Medicine

## 2015-11-18 ENCOUNTER — Ambulatory Visit (INDEPENDENT_AMBULATORY_CARE_PROVIDER_SITE_OTHER): Payer: BLUE CROSS/BLUE SHIELD | Admitting: Family Medicine

## 2015-11-18 VITALS — BP 132/88 | Ht 67.0 in | Wt 162.5 lb

## 2015-11-18 DIAGNOSIS — M545 Low back pain, unspecified: Secondary | ICD-10-CM

## 2015-11-18 DIAGNOSIS — I10 Essential (primary) hypertension: Secondary | ICD-10-CM

## 2015-11-18 DIAGNOSIS — E119 Type 2 diabetes mellitus without complications: Secondary | ICD-10-CM | POA: Diagnosis not present

## 2015-11-18 DIAGNOSIS — Z23 Encounter for immunization: Secondary | ICD-10-CM

## 2015-11-18 LAB — POCT GLYCOSYLATED HEMOGLOBIN (HGB A1C): HEMOGLOBIN A1C: 8.2

## 2015-11-18 NOTE — Progress Notes (Signed)
   Subjective:    Patient ID: Mary Vazquez, female    DOB: September 04, 1956, 59 y.o.   MRN: [id number] Patient arrives office with several distinct concerns.   Diabetes She presents for her follow-up diabetic visit. MedicAlert identification noted. She has not had a previous visit with a dietitian. She does not see a podiatrist.Eye exam is current (05/10/15).   Patient has c/o of right hip tenderness.few wks pain. Post right hip. Deep ache. Lifting a lot tlately. No sig radiation, travels not at all don the leg except once.  Hemoglobin A1c today is: 8.2  Results for orders placed or performed in visit on 11/18/15  POCT HgB A1C  Result Value Ref Range   Hemoglobin A1C 8.2     Admits to dietary noncompliance. Admits to skipping or skimping on meals at times. Low sugar spells couple times per week unfortunately.  Compliant with blood pressure medicine. No obvious side effects. Takes nightly. Watching salt intake.  Review of Systems No headache no chest pain no abdominal pain no change in bowel habits no change in urinary habits ROS otherwise negative    Objective:   Physical Exam Alert vital stable H&T normal neck supple lungs clear heart rare rhythm low right lumbar sacral junction positive tenderness to palpation hip good range of motion negative straight leg raise ankles without edema       Assessment & Plan:  Impression 1 type 2 diabetes control suboptimal in discussed. Patient reluctant to initiate new medication. #2 subacute right lumbar strain discussed local measures discussed over-the-counter medications discussed #3 hypertension good control meds reviewed maintain same dose plan dietary referral. Flu shot today. Diet exercise discussed. All medications refilled. WSL

## 2015-11-20 ENCOUNTER — Other Ambulatory Visit: Payer: Self-pay | Admitting: Family Medicine

## 2015-12-20 ENCOUNTER — Other Ambulatory Visit: Payer: Self-pay | Admitting: Family Medicine

## 2016-01-09 ENCOUNTER — Encounter: Payer: Self-pay | Admitting: Family Medicine

## 2016-01-16 ENCOUNTER — Encounter: Payer: Self-pay | Admitting: Family Medicine

## 2016-01-16 ENCOUNTER — Ambulatory Visit (INDEPENDENT_AMBULATORY_CARE_PROVIDER_SITE_OTHER): Payer: BLUE CROSS/BLUE SHIELD | Admitting: Family Medicine

## 2016-01-16 VITALS — BP 158/94 | Ht 67.0 in | Wt 162.1 lb

## 2016-01-16 DIAGNOSIS — I10 Essential (primary) hypertension: Secondary | ICD-10-CM

## 2016-01-16 DIAGNOSIS — E119 Type 2 diabetes mellitus without complications: Secondary | ICD-10-CM

## 2016-01-16 MED ORDER — ENALAPRIL MALEATE 20 MG PO TABS: 20.0000 mg | ORAL_TABLET | Freq: Every day | ORAL | Status: DC

## 2016-01-16 MED ORDER — CANAGLIFLOZIN 100 MG PO TABS: 100.0000 mg | ORAL_TABLET | Freq: Every day | ORAL | Status: DC

## 2016-01-16 NOTE — Progress Notes (Signed)
   Subjective:    Patient ID: Mary Vazquez, female    DOB: 1956-04-23, 60 y.o.   MRN: [id number]  Hypertension This is a chronic problem. The current episode started more than 1 year ago. There are no compliance problems.    Morn sugars 150 and 180 often in the nmorning. Sugars have been ingesting up over the past half year. Frustrated about this. Claims compliance with meds. Meds reviewed today. No low sugar spells.   Patient in today to discuss hypertension. Patient claims compliance with blood pressure medicine. Claims didn't does not miss a dose. Often blood pressures are somewhat elevated. Watch his salt intake.  Patient would also like to discuss seeing a dietician.   Review of Systems No headache no chest pain no back pain no abdominal pain    Objective:   Physical Exam  Alert vitals stable blood pressure still elevated on repeat HEENT normal lungs clear heart regular rate and rhythm.  Patient goes into great detail on her diet. Frustrated that with her diet she is not seen improvement on sugars and blood pressure. Advised patient is only one part of the problem      Assessment & Plan:  Impression 1 type 2 diabetes suboptimum control need to increase medicine rationale discussed with patient options discussed patient like to try and [place] #2 hypertension suboptimal control discussed on increase medicine. #3 dietary concerns patient went into great detail about her apparent decent diet but would like further pointers plan referral to dietitian increase enalapril rationale discussed adding [place] rationale discussed call us in several weeks her status. 25 minutes spent most in discussion WSL

## 2016-01-17 ENCOUNTER — Encounter: Payer: Self-pay | Admitting: Family Medicine

## 2016-01-24 ENCOUNTER — Other Ambulatory Visit: Payer: Self-pay | Admitting: Obstetrics and Gynecology

## 2016-01-24 DIAGNOSIS — Z1231 Encounter for screening mammogram for malignant neoplasm of breast: Secondary | ICD-10-CM

## 2016-01-27 ENCOUNTER — Ambulatory Visit (HOSPITAL_COMMUNITY)
Admission: RE | Admit: 2016-01-27 | Discharge: 2016-01-27 | Disposition: A | Discharge status: Home / Self Care | Payer: BLUE CROSS/BLUE SHIELD | Source: Ambulatory Visit | Attending: Obstetrics and Gynecology | Admitting: Obstetrics and Gynecology

## 2016-01-27 DIAGNOSIS — Z1231 Encounter for screening mammogram for malignant neoplasm of breast: Secondary | ICD-10-CM | POA: Diagnosis not present

## 2016-03-02 ENCOUNTER — Encounter: Payer: Self-pay | Admitting: Nutrition

## 2016-03-02 ENCOUNTER — Encounter: Payer: BLUE CROSS/BLUE SHIELD | Attending: Family Medicine | Admitting: Nutrition

## 2016-03-02 VITALS — Ht 67.0 in | Wt 161.0 lb

## 2016-03-02 DIAGNOSIS — E118 Type 2 diabetes mellitus with unspecified complications: Secondary | ICD-10-CM

## 2016-03-02 DIAGNOSIS — E119 Type 2 diabetes mellitus without complications: Secondary | ICD-10-CM | POA: Diagnosis present

## 2016-03-02 DIAGNOSIS — E1165 Type 2 diabetes mellitus with hyperglycemia: Secondary | ICD-10-CM

## 2016-03-02 DIAGNOSIS — IMO0002 Reserved for concepts with insufficient information to code with codable children: Secondary | ICD-10-CM

## 2016-03-02 NOTE — Progress Notes (Signed)
  Medical Nutrition Therapy:  Appt start time: 0800 end time:  0930.  Assessment:  Primary concerns today: Diabetes. Most  Recent A1C 8.2%. Lives with her husband. She does the cooking and shopping. Meals are baked, grilled, and some fried. Eats 2-3 meals per day. Walks 2 miles 5 days per week religiously. Testing blood sugars most mornings. Didn't bring meter. History of Gestational DM in her last pregnancy. Family hx of Type 2 DM.  BS 160's usually in am and then drops sometimes before breakfast. 2500 mg of Metformin per day, Glipizide 20 mg per day and Invokana 100 mg daily. Somes after walking her BS are in the 60's and she gets hypoglycmia symptoms of shaking and weakness. Her diet has been inconsistent in carbs at various times at during the day. She tends to eat/snack later at night effecting am blood sugars due to being overly hungry from not eating enough carbs at breakfast and lunch.  Preferred Learning Style:   No preference indicated   Learning Readiness:   Ready  Change in progress   MEDICATIONS: See list   DIETARY INTAKE:   24-hr recall:  B ( AM): Cherrios or shredded wheat, 2% milk and yogurt  Snk ( AM): None  L ( PM): Baked spaghetti, garlic bread and toss salad, itailian and water Snk ( PM): None D ( PM):Meat and 2 vegetables, water: southwest stew with tostitos Snk ( PM): Popcorn or fruit. (8-10 pm) Beverages: water  Usual physical activity:   Estimated energy needs: 1500 calories 170 g carbohydrates 112 g protein 42 g fat  Progress Towards Goal(s):  In progress.   Nutritional Diagnosis:  NB-1.1 Food and nutrition-related knowledge deficit As related to Diabetes.  As evidenced by A1C 9.2%..    Intervention:  Nutrition and Diabetes education provided on My Plate, CHO counting, meal planning, portion sizes, timing of meals, avoiding snacks between meals unless having a low blood sugar, target ranges for A1C and blood sugars, signs/symptoms and treatment of  hyper/hypoglycemia, monitoring blood sugars, taking medications as prescribed, benefits of exercising 30 minutes per day and prevention of complications of DM.  Goals: 1. Follow My Plate 2. Eat 2-3 carb choices per meal. 3. Avoid snacks between meals and after supper unless having a low blood sugar. 4. Test blood sugars in am and before bed and record on logs. 5. Eat protein with all meals. 6. Get A1C down to 8% in three months. 7. IF you have low blood sugars again, talk to PCP about reducing your Glipizide dose.  Teaching Method Utilized:  Visual Auditory Hands on  Handouts given during visit include:  The Plate Method  Meal Plan Card  Diabetes Instructions.   Barriers to learning/adherence to lifestyle change:  None  Demonstrated degree of understanding via:  Teach Back   Monitoring/Evaluation:  Dietary intake, exercise, meal planning, SBG, and body weight in 1 month(s)    RECOMMENDATIONS: Would consider reducing amount of Metformin to max dose of 2000 mg per day and consider reducing amount of Glipizide if she continues to have low blood sugars between breakfast and lunch. May consider a low dose of long acting insulin if BS are not below 8% in three months with better carb balanced meals and cutting out night time snacks.

## 2016-03-02 NOTE — Patient Instructions (Signed)
Goals: 1. Follow My Plate 2. Eat 2-3 carb choices per meal. 3. Avoid snacks between meals and after supper. 4. Test blood sugars in am and before bed and record on logs. 5. Eat protein with all meals. 6. Get A1C down to 8% in three months. 7. IF you have low blood sugars again, talk to PCP about reducing your Glipizide dose.

## 2016-03-10 ENCOUNTER — Other Ambulatory Visit: Payer: Self-pay

## 2016-03-10 MED ORDER — GLIPIZIDE 5 MG PO TABS: 10.0000 mg | ORAL_TABLET | Freq: Two times a day (BID) | ORAL | Status: DC

## 2016-03-19 ENCOUNTER — Ambulatory Visit (INDEPENDENT_AMBULATORY_CARE_PROVIDER_SITE_OTHER): Payer: BLUE CROSS/BLUE SHIELD | Admitting: Family Medicine

## 2016-03-19 ENCOUNTER — Encounter: Payer: Self-pay | Admitting: Family Medicine

## 2016-03-19 VITALS — BP 122/82 | Ht 67.0 in | Wt 160.4 lb

## 2016-03-19 DIAGNOSIS — I10 Essential (primary) hypertension: Secondary | ICD-10-CM

## 2016-03-19 DIAGNOSIS — E119 Type 2 diabetes mellitus without complications: Secondary | ICD-10-CM | POA: Diagnosis not present

## 2016-03-19 DIAGNOSIS — E785 Hyperlipidemia, unspecified: Secondary | ICD-10-CM

## 2016-03-19 LAB — POCT GLYCOSYLATED HEMOGLOBIN (HGB A1C): Hemoglobin A1C: 6.8

## 2016-03-19 MED ORDER — METFORMIN HCL 500 MG PO TABS: ORAL_TABLET | ORAL | Status: DC

## 2016-03-19 MED ORDER — GLIPIZIDE 5 MG PO TABS: 10.0000 mg | ORAL_TABLET | Freq: Two times a day (BID) | ORAL | Status: DC

## 2016-03-19 MED ORDER — ENALAPRIL MALEATE 20 MG PO TABS: 20.0000 mg | ORAL_TABLET | Freq: Every day | ORAL | Status: DC

## 2016-03-19 MED ORDER — CANAGLIFLOZIN 100 MG PO TABS: 100.0000 mg | ORAL_TABLET | Freq: Every day | ORAL | Status: DC

## 2016-03-19 NOTE — Progress Notes (Signed)
   Subjective:    Patient ID: Mary Vazquez, female    DOB: 09-12-1956, 60 y.o.   MRN: [id number]  Diabetes She presents for her follow-up diabetic visit. She has type 2 diabetes mellitus. Risk factors for coronary artery disease include diabetes mellitus, hypertension and post-menopausal. Current diabetic treatment includes oral agent (triple therapy). She is compliant with treatment all of the time. Her weight is stable. She is following a diabetic diet. Eye exam is current.   Results for orders placed or performed in visit on 03/19/16  POCT glycosylated hemoglobin (Hb A1C)  Result Value Ref Range   Hemoglobin A1C 6.8     Pt now working on diet and doing better with numbers   Morn numbers now better,  Generally 127  Or 99 or 100 or better  BP geenralll y under 41 for diastolic  Patient has been working hard on her diet and is more compliant with medications. Thrive substantial benefit from diabetes educational session   Review of Systems No headache, no major weight loss or weight gain, no chest pain no back pain abdominal pain no change in bowel habits complete ROS otherwise negative     Objective:   Physical Exam Alert vitals stable. HEENT normal. Lungs clear. Heart regular rate and rhythm.       Assessment & Plan:  Impression 1 type 2 diabetes clinically substantially improved, between patient's efforts and new medication #2 hypertension good control discussed #3 history of hyperlipidemia and discuss home blood work reviewed couple years ago VLDL was too high. On last check it was good will reassess again. plan maintain same medications diet exercise discussed recheck in 6 months, medications refilled. Further intervention forthcoming after blood work returns [employer]

## 2016-03-25 DIAGNOSIS — I10 Essential (primary) hypertension: Secondary | ICD-10-CM | POA: Diagnosis not present

## 2016-03-25 DIAGNOSIS — E785 Hyperlipidemia, unspecified: Secondary | ICD-10-CM | POA: Diagnosis not present

## 2016-03-25 DIAGNOSIS — E119 Type 2 diabetes mellitus without complications: Secondary | ICD-10-CM | POA: Diagnosis not present

## 2016-03-26 LAB — BASIC METABOLIC PANEL
BUN/Creatinine Ratio: 19 (ref 12–28)
BUN: 16 mg/dL (ref 8–27)
CALCIUM: 9.3 mg/dL (ref 8.7–10.3)
CHLORIDE: 104 mmol/L (ref 96–106)
CO2: 24 mmol/L (ref 18–29)
Creatinine, Ser: 0.85 mg/dL (ref 0.57–1.00)
GFR, EST AFRICAN AMERICAN: 86 mL/min/{1.73_m2} (ref >59)
GFR, EST NON AFRICAN AMERICAN: 74 mL/min/{1.73_m2} (ref >59)
Glucose: 149 mg/dL — ABNORMAL HIGH (ref 65–99)
Potassium: 4.7 mmol/L (ref 3.5–5.2)
Sodium: 143 mmol/L (ref 134–144)

## 2016-03-26 LAB — LIPID PANEL
CHOL/HDL RATIO: 2.6 ratio (ref 0.0–4.4)
Cholesterol, Total: 221 mg/dL — ABNORMAL HIGH (ref 100–199)
HDL: 85 mg/dL (ref >39)
LDL CALC: 123 mg/dL — AB (ref 0–99)
Triglycerides: 66 mg/dL (ref 0–149)
VLDL CHOLESTEROL CAL: 13 mg/dL (ref 5–40)

## 2016-03-26 LAB — HEPATIC FUNCTION PANEL
ALBUMIN: 4.7 g/dL (ref 3.6–4.8)
ALT: 17 IU/L (ref 0–32)
AST: 20 IU/L (ref 0–40)
Alkaline Phosphatase: 59 IU/L (ref 39–117)
BILIRUBIN TOTAL: 0.3 mg/dL (ref 0.0–1.2)
Bilirubin, Direct: 0.08 mg/dL (ref 0.00–0.40)
Total Protein: 7.1 g/dL (ref 6.0–8.5)

## 2016-03-26 LAB — MICROALBUMIN / CREATININE URINE RATIO
Creatinine, Urine: 60.5 mg/dL
MICROALB/CREAT RATIO: 6.1 mg/g{creat} (ref 0.0–30.0)
MICROALBUM., U, RANDOM: 3.7 ug/mL

## 2016-03-29 ENCOUNTER — Encounter: Payer: Self-pay | Admitting: Family Medicine

## 2016-04-06 ENCOUNTER — Ambulatory Visit: Payer: BLUE CROSS/BLUE SHIELD | Admitting: Nutrition

## 2016-06-29 ENCOUNTER — Ambulatory Visit (INDEPENDENT_AMBULATORY_CARE_PROVIDER_SITE_OTHER): Payer: BLUE CROSS/BLUE SHIELD | Admitting: Nurse Practitioner

## 2016-06-29 ENCOUNTER — Encounter: Payer: Self-pay | Admitting: Nurse Practitioner

## 2016-06-29 VITALS — BP 110/72 | Ht 67.0 in | Wt 160.2 lb

## 2016-06-29 DIAGNOSIS — Z23 Encounter for immunization: Secondary | ICD-10-CM

## 2016-06-29 DIAGNOSIS — S81811A Laceration without foreign body, right lower leg, initial encounter: Secondary | ICD-10-CM | POA: Diagnosis not present

## 2016-07-02 ENCOUNTER — Encounter: Payer: Self-pay | Admitting: Nurse Practitioner

## 2016-07-02 NOTE — Progress Notes (Signed)
Subjective:  Presents for complaints of a superficial laceration on the right lower leg that occurred on 7/14. Has been keeping it clean, applying triple antibiotic ointment with gauze. No fever. Tetanus status unknown.  Objective:   BP 110/72 mmHg  Ht 5\' 7"  (1.702 m)  Wt 160 lb 3.2 oz (72.666 kg)  BMI 25.08 kg/m2 NAD. Alert, oriented. A superficial laceration and a slight V-shaped noted on the right lower leg. No indication for sutures. No signs of infection.  Assessment: Laceration of leg, right, initial encounter - Plan: Td vaccine greater than or equal to 7yo preservative free IM  Plan: Reviewed wound care and signs of infection. Expect gradual healing. Call back if any further problems.

## 2016-07-03 ENCOUNTER — Other Ambulatory Visit: Payer: Self-pay | Admitting: Nurse Practitioner

## 2016-07-03 MED ORDER — FLUCONAZOLE 150 MG PO TABS: ORAL_TABLET | ORAL | Status: DC

## 2016-07-22 ENCOUNTER — Encounter: Payer: Self-pay | Admitting: Family Medicine

## 2016-07-22 ENCOUNTER — Ambulatory Visit (INDEPENDENT_AMBULATORY_CARE_PROVIDER_SITE_OTHER): Payer: BLUE CROSS/BLUE SHIELD | Admitting: Family Medicine

## 2016-07-22 VITALS — BP 152/100 | Ht 67.0 in | Wt 160.6 lb

## 2016-07-22 DIAGNOSIS — R0789 Other chest pain: Secondary | ICD-10-CM

## 2016-07-22 MED ORDER — HYDROCODONE-ACETAMINOPHEN 5-325 MG PO TABS: 1.0000 | ORAL_TABLET | Freq: Four times a day (QID) | ORAL | 0 refills | Status: DC | PRN

## 2016-07-22 NOTE — Progress Notes (Signed)
   Subjective:    Patient ID: Mary Vazquez, female    DOB: Jul 02, 1956, 60 y.o.   MRN: [id number]  HPI Patient arrives with c/o right side pain-feels spasmodic in nature-getting worse thru out the day. Patient has tries stretching and ice without relief. Denies any triggers for this denies hematuria denies dysuria or urinary frequency denies cough wheezing shortness of breath or fevers Review of Systems Pain discomfort in the lower rib region on the right side hurts with deep breath hurts with cough sneeze rotation and flexing    Objective:   Physical Exam  Lungs are clear respiratory rate normal heart regular no murmurs pulse normal abdomen is soft no guarding rebound or tenderness no rash seen no rib tenderness patient has increased pain with rotation and flexing to the right side the pain is in the mid axillary line of the lower ribs. Once again no masses seen or felt      Assessment & Plan:  There is no obvious source more than likely musculoskeletal with pulled muscle ibuprofen 600 mg 3 times daily over the course of next 5-7 days may use OTC ibuprofen hydrocodone for severe pain caution drowsiness use only when at home. If not dramatically better over the course of the next 48 hours patient needs to call us may need x-rays no sign of shingles no sign of intra-abdominal pathology

## 2016-07-22 NOTE — Patient Instructions (Signed)
If not better by Friday CALL!!!

## 2016-08-12 ENCOUNTER — Encounter: Payer: Self-pay | Admitting: Family Medicine

## 2016-09-18 ENCOUNTER — Ambulatory Visit: Payer: BLUE CROSS/BLUE SHIELD | Admitting: Family Medicine

## 2016-10-28 ENCOUNTER — Ambulatory Visit: Payer: BLUE CROSS/BLUE SHIELD | Admitting: Family Medicine

## 2016-11-03 ENCOUNTER — Ambulatory Visit (INDEPENDENT_AMBULATORY_CARE_PROVIDER_SITE_OTHER): Payer: BLUE CROSS/BLUE SHIELD | Admitting: Family Medicine

## 2016-11-03 ENCOUNTER — Encounter: Payer: Self-pay | Admitting: Family Medicine

## 2016-11-03 VITALS — BP 136/90 | Ht 67.0 in | Wt 159.0 lb

## 2016-11-03 DIAGNOSIS — I10 Essential (primary) hypertension: Secondary | ICD-10-CM | POA: Diagnosis not present

## 2016-11-03 DIAGNOSIS — K219 Gastro-esophageal reflux disease without esophagitis: Secondary | ICD-10-CM | POA: Diagnosis not present

## 2016-11-03 DIAGNOSIS — J4520 Mild intermittent asthma, uncomplicated: Secondary | ICD-10-CM

## 2016-11-03 DIAGNOSIS — E119 Type 2 diabetes mellitus without complications: Secondary | ICD-10-CM

## 2016-11-03 LAB — POCT GLYCOSYLATED HEMOGLOBIN (HGB A1C): HEMOGLOBIN A1C: 7.1

## 2016-11-03 MED ORDER — ENALAPRIL MALEATE 20 MG PO TABS: 20.0000 mg | ORAL_TABLET | Freq: Every day | ORAL | 5 refills | Status: DC

## 2016-11-03 MED ORDER — GLIPIZIDE 5 MG PO TABS: 10.0000 mg | ORAL_TABLET | Freq: Two times a day (BID) | ORAL | 5 refills | Status: DC

## 2016-11-03 MED ORDER — CANAGLIFLOZIN 100 MG PO TABS: 100.0000 mg | ORAL_TABLET | Freq: Every day | ORAL | 5 refills | Status: DC

## 2016-11-03 MED ORDER — METFORMIN HCL 500 MG PO TABS: ORAL_TABLET | ORAL | 5 refills | Status: DC

## 2016-11-03 NOTE — Progress Notes (Signed)
   Subjective:    Patient ID: Mary Vazquez, female    DOB: February 25, 1956, 60 y.o.   MRN: [id number]  Diabetes  She presents for her follow-up diabetic visit. She has type 2 diabetes mellitus. She is compliant with treatment all of the time. She is following a diabetic diet. Exercise: exercises 5 days a week. Home blood sugar record trend: does not check blood sugar. She does not see a podiatrist.Eye exam is current.   Results for orders placed or performed in visit on 11/03/16  POCT glycosylated hemoglobin (Hb A1C)  Result Value Ref Range   Hemoglobin A1C 7.1    exerc five d per wk  Gives grade B  Neg eye visit   Rare use of inhaler at this time    [facility] for forty five min two mi per dy  Blood pressure medicine and blood pressure levels reviewed today with patient. Compliant with blood pressure medicine. States does not miss a dose. No obvious side effects. Blood pressure generally good when checked elsewhere. Watching salt intake.  Reactive airways. Rare use of inhaler. No smoke exposure  Pt states no concerns today.    Review of Systems    No headache, no major weight loss or weight gain, no chest pain no back pain abdominal pain no change in bowel habits complete ROS otherwise negative  Objective:   Physical Exam  Alert vitals stable, NAD. Blood pressure good on repeat. HEENT normal. Lungs clear. Heart regular rate and rhythm.       Assessment & Plan:  Impression 1 type 2 diabetes discussed good control #2 hypertension discussed maintain same meds good control #3 reactive airways clinically stable occasional wheezing but uncommon. Still uses when necessary albuterol. Plan diet exercise discussed. Medications refilled. Flu shot already given. WSL

## 2016-11-25 ENCOUNTER — Other Ambulatory Visit: Payer: Self-pay | Admitting: Nurse Practitioner

## 2016-11-25 ENCOUNTER — Encounter: Payer: Self-pay | Admitting: Nurse Practitioner

## 2016-11-25 MED ORDER — FLUCONAZOLE 150 MG PO TABS: ORAL_TABLET | ORAL | 0 refills | Status: DC

## 2016-12-11 ENCOUNTER — Other Ambulatory Visit: Payer: Self-pay | Admitting: *Deleted

## 2016-12-11 ENCOUNTER — Encounter: Payer: Self-pay | Admitting: Family Medicine

## 2016-12-11 MED ORDER — EMPAGLIFLOZIN 10 MG PO TABS: 10.0000 mg | ORAL_TABLET | Freq: Every day | ORAL | 0 refills | Status: DC

## 2016-12-11 NOTE — Telephone Encounter (Signed)
Pharm states invokana is on [employer]. Consult with dr Nicki Reaper. Change to jardiance 10mg  one daily per dr Nicki Reaper. Called in script to pharm and insurance will cover. Pt notified.

## 2016-12-11 NOTE — Telephone Encounter (Signed)
Nurse's-please talk with patient find out what the issue is. There are other medicines that are similar including Jardiance and also [place] but those particular medications may or may not be covered by her insurance. Also find out is a possible for the patient to consider getting her medications through a different pharmacy at [facility] doesn't have. Please try to salt this patient's issue if there is more that we can do let us now.

## 2016-12-25 ENCOUNTER — Ambulatory Visit: Payer: BLUE CROSS/BLUE SHIELD | Admitting: Family Medicine

## 2016-12-25 ENCOUNTER — Encounter: Payer: Self-pay | Admitting: Nurse Practitioner

## 2016-12-25 ENCOUNTER — Ambulatory Visit (INDEPENDENT_AMBULATORY_CARE_PROVIDER_SITE_OTHER): Payer: BLUE CROSS/BLUE SHIELD | Admitting: Nurse Practitioner

## 2016-12-25 VITALS — BP 122/84 | Temp 97.6°F | Ht 67.0 in | Wt 157.8 lb

## 2016-12-25 DIAGNOSIS — J0120 Acute ethmoidal sinusitis, unspecified: Secondary | ICD-10-CM | POA: Diagnosis not present

## 2016-12-25 MED ORDER — AZITHROMYCIN 250 MG PO TABS: ORAL_TABLET | ORAL | 0 refills | Status: DC

## 2016-12-26 ENCOUNTER — Encounter: Payer: Self-pay | Admitting: Nurse Practitioner

## 2016-12-26 NOTE — Progress Notes (Signed)
Subjective:  Presents for c/o cough and sinus congestion x 3 days. No fever. Sore throat today with cough. Ethmoid sinus area headache. Spells of coughing mainly in the am; some color. No ear pain or wheezing. Blood sugars stable.   Objective:   BP 122/84   Temp 97.6 F (36.4 C) (Oral)   Ht 5\' 7"  (1.702 m)   Wt 157 lb 12.8 oz (71.6 kg)   BMI 24.71 kg/m  NAD. Alert, oriented. TMs retracted, no erythema. Pharynx mild erythema with PND noted. Neck supple with mild anterior adenopathy. Lungs clear. Heart RRR.   Assessment:  Acute non-recurrent ethmoidal sinusitis    Plan:  Meds ordered this encounter  Medications  . azithromycin (ZITHROMAX Z-PAK) 250 MG tablet    Sig: Take 2 tablets (500 mg) on  Day 1,  followed by 1 tablet (250 mg) once daily on Days 2 through 5.    Dispense:  6 each    Refill:  0    Order Specific Question:   Supervising Provider    Answer:   Mikey Kirschner [[id number]]   OTC meds as directed. Call back if worsens or persists.

## 2017-01-01 ENCOUNTER — Other Ambulatory Visit: Payer: Self-pay | Admitting: Family Medicine

## 2017-02-09 ENCOUNTER — Other Ambulatory Visit: Payer: Self-pay | Admitting: Obstetrics and Gynecology

## 2017-02-09 DIAGNOSIS — Z1231 Encounter for screening mammogram for malignant neoplasm of breast: Secondary | ICD-10-CM

## 2017-02-22 ENCOUNTER — Ambulatory Visit (HOSPITAL_COMMUNITY)
Admission: RE | Admit: 2017-02-22 | Discharge: 2017-02-22 | Disposition: A | Discharge status: Home / Self Care | Payer: BLUE CROSS/BLUE SHIELD | Source: Ambulatory Visit | Attending: Obstetrics and Gynecology | Admitting: Obstetrics and Gynecology

## 2017-02-22 DIAGNOSIS — Z1231 Encounter for screening mammogram for malignant neoplasm of breast: Secondary | ICD-10-CM | POA: Insufficient documentation

## 2017-03-08 ENCOUNTER — Telehealth: Payer: Self-pay | Admitting: *Deleted

## 2017-03-08 NOTE — Telephone Encounter (Signed)
See fax from [facility]. Form in your folder. Pt would like to know if it is appropriate to start a statin therapy.

## 2017-03-14 NOTE — Telephone Encounter (Signed)
Recommend we wait til the screening yrly blood wor at next visit to discuss this complicated recommendation, if pt wishes to come in before six mo ck up and disc, sched o v

## 2017-03-15 NOTE — Telephone Encounter (Signed)
Left message return call 03/15/2017 

## 2017-03-15 NOTE — Telephone Encounter (Signed)
Spoke with patient and patient stated that she did not request a statin medication. Patient verbalized understanding.

## 2017-05-03 ENCOUNTER — Encounter: Payer: Self-pay | Admitting: Family Medicine

## 2017-05-03 ENCOUNTER — Ambulatory Visit (INDEPENDENT_AMBULATORY_CARE_PROVIDER_SITE_OTHER): Payer: BLUE CROSS/BLUE SHIELD | Admitting: Family Medicine

## 2017-05-03 VITALS — BP 120/80 | Ht 67.0 in | Wt 155.4 lb

## 2017-05-03 DIAGNOSIS — M545 Low back pain: Secondary | ICD-10-CM | POA: Diagnosis not present

## 2017-05-03 DIAGNOSIS — E7800 Pure hypercholesterolemia, unspecified: Secondary | ICD-10-CM

## 2017-05-03 DIAGNOSIS — G8929 Other chronic pain: Secondary | ICD-10-CM | POA: Diagnosis not present

## 2017-05-03 DIAGNOSIS — I10 Essential (primary) hypertension: Secondary | ICD-10-CM

## 2017-05-03 DIAGNOSIS — E119 Type 2 diabetes mellitus without complications: Secondary | ICD-10-CM | POA: Diagnosis not present

## 2017-05-03 LAB — POCT GLYCOSYLATED HEMOGLOBIN (HGB A1C): HEMOGLOBIN A1C: 7.1

## 2017-05-03 MED ORDER — ENALAPRIL MALEATE 20 MG PO TABS: 20.0000 mg | ORAL_TABLET | Freq: Every day | ORAL | 5 refills | Status: DC

## 2017-05-03 MED ORDER — GLIPIZIDE 5 MG PO TABS: 10.0000 mg | ORAL_TABLET | Freq: Two times a day (BID) | ORAL | 5 refills | Status: DC

## 2017-05-03 MED ORDER — METFORMIN HCL 500 MG PO TABS: ORAL_TABLET | ORAL | 5 refills | Status: DC

## 2017-05-03 MED ORDER — EMPAGLIFLOZIN 10 MG PO TABS: 10.0000 mg | ORAL_TABLET | Freq: Every day | ORAL | 5 refills | Status: DC

## 2017-05-03 NOTE — Addendum Note (Signed)
Addended by: Launa Grill on: 05/03/2017 02:17 PM   Modules accepted: Orders

## 2017-05-03 NOTE — Patient Instructions (Signed)
Back Exercises The following exercises strengthen the muscles that help to support the back. They also help to keep the lower back flexible. Doing these exercises can help to prevent back pain or lessen existing pain. If you have back pain or discomfort, try doing these exercises 2-3 times each day or as told by your health care provider. When the pain goes away, do them once each day, but increase the number of times that you repeat the steps for each exercise (do more repetitions). If you do not have back pain or discomfort, do these exercises once each day or as told by your health care provider. Exercises Single Knee to Chest   Repeat these steps 3-5 times for each leg: 1. Lie on your back on a firm bed or the floor with your legs extended. 2. Bring one knee to your chest. Your other leg should stay extended and in contact with the floor. 3. Hold your knee in place by grabbing your knee or thigh. 4. Pull on your knee until you feel a gentle stretch in your lower back. 5. Hold the stretch for 10-30 seconds. 6. Slowly release and straighten your leg. Pelvic Tilt   Repeat these steps 5-10 times: 1. Lie on your back on a firm bed or the floor with your legs extended. 2. Bend your knees so they are pointing toward the ceiling and your feet are flat on the floor. 3. Tighten your lower abdominal muscles to press your lower back against the floor. This motion will tilt your pelvis so your tailbone points up toward the ceiling instead of pointing to your feet or the floor. 4. With gentle tension and even breathing, hold this position for 5-10 seconds. Cat-Cow   Repeat these steps until your lower back becomes more flexible: 1. Get into a hands-and-knees position on a firm surface. Keep your hands under your shoulders, and keep your knees under your hips. You may place padding under your knees for comfort. 2. Let your head hang down, and point your tailbone toward the floor so your lower back  becomes rounded like the back of a cat. 3. Hold this position for 5 seconds. 4. Slowly lift your head and point your tailbone up toward the ceiling so your back forms a sagging arch like the back of a cow. 5. Hold this position for 5 seconds. Press-Ups   Repeat these steps 5-10 times: 1. Lie on your abdomen (face-down) on the floor. 2. Place your palms near your head, about shoulder-width apart. 3. While you keep your back as relaxed as possible and keep your hips on the floor, slowly straighten your arms to raise the top half of your body and lift your shoulders. Do not use your back muscles to raise your upper torso. You may adjust the placement of your hands to make yourself more comfortable. 4. Hold this position for 5 seconds while you keep your back relaxed. 5. Slowly return to lying flat on the floor. Bridges   Repeat these steps 10 times: 1. Lie on your back on a firm surface. 2. Bend your knees so they are pointing toward the ceiling and your feet are flat on the floor. 3. Tighten your buttocks muscles and lift your buttocks off of the floor until your waist is at almost the same height as your knees. You should feel the muscles working in your buttocks and the back of your thighs. If you do not feel these muscles, slide your feet 1-2 inches farther  away from your buttocks. 4. Hold this position for 3-5 seconds. 5. Slowly lower your hips to the starting position, and allow your buttocks muscles to relax completely. If this exercise is too easy, try doing it with your arms crossed over your chest. Abdominal Crunches   Repeat these steps 5-10 times: 1. Lie on your back on a firm bed or the floor with your legs extended. 2. Bend your knees so they are pointing toward the ceiling and your feet are flat on the floor. 3. Cross your arms over your chest. 4. Tip your chin slightly toward your chest without bending your neck. 5. Tighten your abdominal muscles and slowly raise your trunk  (torso) high enough to lift your shoulder blades a tiny bit off of the floor. Avoid raising your torso higher than that, because it can put too much stress on your low back and it does not help to strengthen your abdominal muscles. 6. Slowly return to your starting position. Back Lifts  Repeat these steps 5-10 times: 1. Lie on your abdomen (face-down) with your arms at your sides, and rest your forehead on the floor. 2. Tighten the muscles in your legs and your buttocks. 3. Slowly lift your chest off of the floor while you keep your hips pressed to the floor. Keep the back of your head in line with the curve in your back. Your eyes should be looking at the floor. 4. Hold this position for 3-5 seconds. 5. Slowly return to your starting position. Contact a health care provider if:  Your back pain or discomfort gets much worse when you do an exercise.  Your back pain or discomfort does not lessen within 2 hours after you exercise. If you have any of these problems, stop doing these exercises right away. Do not do them again unless your health care provider says that you can. Get help right away if:  You develop sudden, severe back pain. If this happens, stop doing the exercises right away. Do not do them again unless your health care provider says that you can. This information is not intended to replace advice given to you by your health care provider. Make sure you discuss any questions you have with your health care provider. Document Released: 01/07/2005 Document Revised: 04/08/2016 Document Reviewed: 01/24/2015 Elsevier Interactive Patient Education  2017 [employer] Back Pain, Adult Back pain is very common in adults.The cause of back pain is rarely dangerous and the pain often gets better over time.The cause of your back pain may not be known. Some common causes of back pain include:  Strain of the muscles or ligaments supporting the spine.  Wear and tear (degeneration) of the  spinal disks.  Arthritis.  Direct injury to the back. For many people, back pain may return. Since back pain is rarely dangerous, most people can learn to manage this condition on their own. Follow these instructions at home: Watch your back pain for any changes. The following actions may help to lessen any discomfort you are feeling:  Remain active. It is stressful on your back to sit or stand in one place for long periods of time. Do not sit, drive, or stand in one place for more than 30 minutes at a time. Take short walks on even surfaces as soon as you are able.Try to increase the length of time you walk each day.  Exercise regularly as directed by your health care provider. Exercise helps your back heal faster. It also helps avoid future  injury by keeping your muscles strong and flexible.  Do not stay in bed.Resting more than 1-2 days can delay your recovery.  Pay attention to your body when you bend and lift. The most comfortable positions are those that put less stress on your recovering back. Always use proper lifting techniques, including:  Bending your knees.  Keeping the load close to your body.  Avoiding twisting.  Find a comfortable position to sleep. Use a firm mattress and lie on your side with your knees slightly bent. If you lie on your back, put a pillow under your knees.  Avoid feeling anxious or stressed.Stress increases muscle tension and can worsen back pain.It is important to recognize when you are anxious or stressed and learn ways to manage it, such as with exercise.  Take medicines only as directed by your health care provider. Over-the-counter medicines to reduce pain and inflammation are often the most helpful.Your health care provider may prescribe muscle relaxant drugs.These medicines help dull your pain so you can more quickly return to your normal activities and healthy exercise.  Apply ice to the injured area:  Put ice in a plastic bag.  Place a  towel between your skin and the bag.  Leave the ice on for 20 minutes, 2-3 times a day for the first 2-3 days. After that, ice and heat may be alternated to reduce pain and spasms.  Maintain a healthy weight. Excess weight puts extra stress on your back and makes it difficult to maintain good posture. Contact a health care provider if:  You have pain that is not relieved with rest or medicine.  You have increasing pain going down into the legs or buttocks.  You have pain that does not improve in one week.  You have night pain.  You lose weight.  You have a fever or chills. Get help right away if:  You develop new bowel or bladder control problems.  You have unusual weakness or numbness in your arms or legs.  You develop nausea or vomiting.  You develop abdominal pain.  You feel faint. This information is not intended to replace advice given to you by your health care provider. Make sure you discuss any questions you have with your health care provider. Document Released: 11/30/2005 Document Revised: 04/09/2016 Document Reviewed: 04/03/2014 Elsevier Interactive Patient Education  2017 [employer].

## 2017-05-03 NOTE — Progress Notes (Signed)
   Subjective:    Patient ID: Mary Vazquez, female    DOB: 1956/06/07, 61 y.o.   MRN: [id number] Patient presents with numerous concerns Diabetes  She presents for her follow-up diabetic visit. She has type 2 diabetes mellitus. She does not see a podiatrist.Eye exam current: Due in July    Patient claims compliance with diabetes medication. No obvious side effects. Reports no substantial low sugar spells. Most numbers are generally in good range when checked fasting. Generally does not miss a dose of medication. Watching diabetic diet closely  Blood pressure medicine and blood pressure levels reviewed today with patient. Compliant with blood pressure medicine. States does not miss a dose. No obvious side effects. Blood pressure generally good when checked elsewhere. Watching salt intake.  Mostly watching diet,  Does not meiss meds   Not taking as good care of self  Going to work on [place] status , not tsking csre of self  Not eating the best    Patient has concerns pain to right hip.  Hx of pain in hip since falling and breaking ankle, likely post hip pain, acts up at times, rarely takes andt inflam meds, helps some, does not raeiate into the leg, mostly pist hip, no hx of execises currently, doing canvsing pozt election   July eye vist last yr   Results for orders placed or performed in visit on 05/03/17  POCT HgB A1C  Result Value Ref Range   Hemoglobin A1C 7.1      Review of Systems No headache, no major weight loss or weight gain, no chest pain no back pain abdominal pain no change in bowel habits complete ROS otherwise negative     Objective:   Physical Exam  Alert and oriented, vitals reviewed and stable, NAD ENT-TM's and ext canals WNL bilat via otoscopic exam Soft palate, tonsils and post pharynx WNL via oropharyngeal exam Neck-symmetric, no masses; thyroid nonpalpable and nontender Pulmonary-no tachypnea or accessory muscle use; Clear without  wheezes via auscultation Card--no abnrml murmurs, rhythm reg and rate WNL Carotid pulses symmetric, without bruits Right low lumbar tenderness to deep palpation. Negative straight leg raise      Assessment & Plan:  #1 chronic right lumbar strain. Nature of condition discussed at length exercises encouraged and discussed symptom care discussed no imaging rationale discussed #2 type 2 diabetes control nearly excellent discussed maintain same approach over 3 hypertension good control discussed maintain same plan as noted above plus appropriate blood work follow-up in 6 months [facility]

## 2017-05-13 DIAGNOSIS — E119 Type 2 diabetes mellitus without complications: Secondary | ICD-10-CM | POA: Diagnosis not present

## 2017-05-13 DIAGNOSIS — E7800 Pure hypercholesterolemia, unspecified: Secondary | ICD-10-CM | POA: Diagnosis not present

## 2017-05-13 DIAGNOSIS — I10 Essential (primary) hypertension: Secondary | ICD-10-CM | POA: Diagnosis not present

## 2017-05-14 LAB — HEPATIC FUNCTION PANEL
ALK PHOS: 55 IU/L (ref 39–117)
ALT: 14 IU/L (ref 0–32)
AST: 18 IU/L (ref 0–40)
Albumin: 4.4 g/dL (ref 3.6–4.8)
BILIRUBIN, DIRECT: 0.08 mg/dL (ref 0.00–0.40)
Bilirubin Total: 0.2 mg/dL (ref 0.0–1.2)
TOTAL PROTEIN: 6.7 g/dL (ref 6.0–8.5)

## 2017-05-14 LAB — BASIC METABOLIC PANEL
BUN / CREAT RATIO: 18 (ref 12–28)
BUN: 17 mg/dL (ref 8–27)
CHLORIDE: 103 mmol/L (ref 96–106)
CO2: 26 mmol/L (ref 18–29)
Calcium: 9.5 mg/dL (ref 8.7–10.3)
Creatinine, Ser: 0.93 mg/dL (ref 0.57–1.00)
GFR calc Af Amer: 76 mL/min/{1.73_m2} (ref >59)
GFR calc non Af Amer: 66 mL/min/{1.73_m2} (ref >59)
GLUCOSE: 145 mg/dL — AB (ref 65–99)
Potassium: 4.5 mmol/L (ref 3.5–5.2)
SODIUM: 140 mmol/L (ref 134–144)

## 2017-05-14 LAB — LIPID PANEL
CHOL/HDL RATIO: 2.9 ratio (ref 0.0–4.4)
Cholesterol, Total: 208 mg/dL — ABNORMAL HIGH (ref 100–199)
HDL: 72 mg/dL (ref >39)
LDL Calculated: 122 mg/dL — ABNORMAL HIGH (ref 0–99)
TRIGLYCERIDES: 72 mg/dL (ref 0–149)
VLDL CHOLESTEROL CAL: 14 mg/dL (ref 5–40)

## 2017-05-14 LAB — MICROALBUMIN / CREATININE URINE RATIO
CREATININE, UR: 93.3 mg/dL
Microalb/Creat Ratio: <3.2 mg/g creat (ref 0.0–30.0)

## 2017-05-17 ENCOUNTER — Encounter: Payer: Self-pay | Admitting: Family Medicine

## 2017-06-08 ENCOUNTER — Other Ambulatory Visit (HOSPITAL_COMMUNITY)
Admission: RE | Admit: 2017-06-08 | Discharge: 2017-06-08 | Disposition: A | Discharge status: Home / Self Care | Payer: BLUE CROSS/BLUE SHIELD | Source: Ambulatory Visit | Attending: Obstetrics & Gynecology | Admitting: Obstetrics & Gynecology

## 2017-06-08 ENCOUNTER — Encounter: Payer: Self-pay | Admitting: Obstetrics & Gynecology

## 2017-06-08 ENCOUNTER — Ambulatory Visit (INDEPENDENT_AMBULATORY_CARE_PROVIDER_SITE_OTHER): Payer: BLUE CROSS/BLUE SHIELD | Admitting: Obstetrics & Gynecology

## 2017-06-08 VITALS — BP 120/80 | HR 80 | Ht 67.0 in | Wt 162.0 lb

## 2017-06-08 DIAGNOSIS — B373 Candidiasis of vulva and vagina: Secondary | ICD-10-CM | POA: Diagnosis not present

## 2017-06-08 DIAGNOSIS — Z01419 Encounter for gynecological examination (general) (routine) without abnormal findings: Secondary | ICD-10-CM | POA: Insufficient documentation

## 2017-06-08 DIAGNOSIS — Z01411 Encounter for gynecological examination (general) (routine) with abnormal findings: Secondary | ICD-10-CM

## 2017-06-08 DIAGNOSIS — B3731 Acute candidiasis of vulva and vagina: Secondary | ICD-10-CM

## 2017-06-08 MED ORDER — FLUCONAZOLE 100 MG PO TABS: 100.0000 mg | ORAL_TABLET | Freq: Every day | ORAL | 0 refills | Status: DC

## 2017-06-08 NOTE — Progress Notes (Signed)
Subjective:     Mary Vazquez is a 61 y.o. female here for a routine exam.  No LMP recorded. Patient is postmenopausal. G3P3 Birth Control Method:  Tubal ligation menopausal Menstrual Calendar(currently): amenorrheic  Current complaints: irritation and some post coital spotting 1 time per month or so.   Current acute medical issues:  Diabetes, hypertension   Recent Gynecologic History No LMP recorded. Patient is postmenopausal. Last Pap: 2013,  normal Last mammogram: 02/2017,  normal  Past Medical History:  Diagnosis Date  . Diabetes ([place])   . Hypertension   . PONV (postoperative nausea and vomiting)     Past Surgical History:  Procedure Laterality Date  . COLONOSCOPY   08/08/2007   SLF:  A 5-mm sessile descending colon polyp removed via cold forceps.Normal retroflexed view of the rectum/ Small internal hemorrhoids, PATH: tubular adenoma  . COLONOSCOPY  01/09/2013   Procedure: COLONOSCOPY;  Surgeon: Danie Binder, MD;  Location: [facility];  Service: Endoscopy;  Laterality: N/A;  8:30  . TUBAL LIGATION      OB History    Gravida Para Term Preterm AB Living   3             SAB TAB Ectopic Multiple Live Births                  Social History   Social History  . Marital status: Married    Spouse name: N/A  . Number of children: N/A  . Years of education: N/A   Social History Main Topics  . Smoking status: Never Smoker  . Smokeless tobacco: Never Used  . Alcohol use No  . Drug use: No  . Sexual activity: Yes    Birth control/ protection: Post-menopausal   Other Topics Concern  . None   Social History Narrative  . None    Family History  Problem Relation Age of Onset  . Cancer Father        prostate  . Diabetes Sister   . Diabetes Maternal Grandmother   . Diabetes Paternal Aunt   . Colon cancer Neg Hx      Current Outpatient Prescriptions:  .  empagliflozin (JARDIANCE) 10 MG TABS tablet, Take 10 mg by mouth daily., Disp: 30 tablet, Rfl: 5 .   enalapril (VASOTEC) 20 MG tablet, Take 1 tablet (20 mg total) by mouth daily., Disp: 30 tablet, Rfl: 5 .  Flaxseed, Linseed, (FLAX SEED OIL PO), Take 1 tablet by mouth 2 (two) times daily., Disp: , Rfl:  .  GARLIC PO, Take by mouth., Disp: , Rfl:  .  glipiZIDE (GLUCOTROL) 5 MG tablet, Take 2 tablets (10 mg total) by mouth 2 (two) times daily., Disp: 120 tablet, Rfl: 5 .  LECITHIN PO, Take by mouth., Disp: , Rfl:  .  metFORMIN (GLUCOPHAGE) 500 MG tablet, TAKE 2 TABLETS IN THE MORNING TAKE 1 TABLET AT NOON TAKE 2 TABLETS IN THE EVENING, Disp: 150 tablet, Rfl: 5 .  Propylene Glycol 0.6 % SOLN, Apply 1 drop to eye daily., Disp: 10 mL, Rfl: 5 .  TURMERIC PO, Take by mouth., Disp: , Rfl:  .  fluconazole (DIFLUCAN) 100 MG tablet, Take 1 tablet (100 mg total) by mouth daily., Disp: 14 tablet, Rfl: 0  Review of Systems  Review of Systems  Constitutional: Negative for fever, chills, weight loss, malaise/fatigue and diaphoresis.  HENT: Negative for hearing loss, ear pain, nosebleeds, congestion, sore throat, neck pain, tinnitus and ear discharge.   Eyes: Negative  for blurred vision, double vision, photophobia, pain, discharge and redness.  Respiratory: Negative for cough, hemoptysis, sputum production, shortness of breath, wheezing and stridor.   Cardiovascular: Negative for chest pain, palpitations, orthopnea, claudication, leg swelling and PND.  Gastrointestinal: negative for abdominal pain. Negative for heartburn, nausea, vomiting, diarrhea, constipation, blood in stool and melena.  Genitourinary: Negative for dysuria, urgency, frequency, hematuria and flank pain.  Musculoskeletal: Negative for myalgias, back pain, joint pain and falls.  Skin: Negative for itching and rash.  Neurological: Negative for dizziness, tingling, tremors, sensory change, speech change, focal weakness, seizures, loss of consciousness, weakness and headaches.  Endo/Heme/Allergies: Negative for environmental allergies and  polydipsia. Does not bruise/bleed easily.  Psychiatric/Behavioral: Negative for depression, suicidal ideas, hallucinations, memory loss and substance abuse. The patient is not nervous/anxious and does not have insomnia.        Objective:  Blood pressure 120/80, pulse 80, height 5\' 7"  (1.702 m), weight 162 lb (73.5 kg).   Physical Exam  Vitals reviewed. Constitutional: She is oriented to person, place, and time. She appears well-developed and well-nourished.  HENT:  Head: Normocephalic and atraumatic.        Right Ear: External ear normal.  Left Ear: External ear normal.  Nose: Nose normal.  Mouth/Throat: Oropharynx is clear and moist.  Eyes: Conjunctivae and EOM are normal. Pupils are equal, round, and reactive to light. Right eye exhibits no discharge. Left eye exhibits no discharge. No scleral icterus.  Neck: Normal range of motion. Neck supple. No tracheal deviation present. No thyromegaly present.  Cardiovascular: Normal rate, regular rhythm, normal heart sounds and intact distal pulses.  Exam reveals no gallop and no friction rub.   No murmur heard. Respiratory: Effort normal and breath sounds normal. No respiratory distress. She has no wheezes. She has no rales. She exhibits no tenderness.  GI: Soft. Bowel sounds are normal. She exhibits no distension and no mass. There is no tenderness. There is no rebound and no guarding.  Genitourinary:  Breasts no masses skin changes or nipple changes bilaterally      Vulva yeast vulvitis no lesions Vagina is pink moist without discharge Cervix normal in appearance and pap is done Uterus is normal size shape and contour Adnexa is negative with normal sized ovaries  {Rectal    Severe hemorrhoid right now, rectal skipped, will be heme + and also for sure would hurt Musculoskeletal: Normal range of motion. She exhibits no edema and no tenderness.  Neurological: She is alert and oriented to person, place, and time. She has normal reflexes. She  displays normal reflexes. No cranial nerve deficit. She exhibits normal muscle tone. Coordination normal.  Skin: Skin is warm and dry. No rash noted. No erythema. No pallor.  Psychiatric: She has a normal mood and affect. Her behavior is normal. Judgment and thought content normal.       Medications Ordered at today's visit: Meds ordered this encounter  Medications  . fluconazole (DIFLUCAN) 100 MG tablet    Sig: Take 1 tablet (100 mg total) by mouth daily.    Dispense:  14 tablet    Refill:  0    Other orders placed at today's visit: No orders of the defined types were placed in this encounter.     Assessment:    Healthy female exam.   yeast vulvitis: gentian violet + oral diflucan hemorrhoid Plan:    Follow up in: 3 weeks. reheck yeast infection     Return in about 3 weeks (around 06/29/2017)  for Follow up, with Dr Elonda Husky.

## 2017-06-11 LAB — CYTOLOGY - PAP
Diagnosis: NEGATIVE
HPV: NOT DETECTED

## 2017-06-29 ENCOUNTER — Ambulatory Visit (INDEPENDENT_AMBULATORY_CARE_PROVIDER_SITE_OTHER): Payer: BLUE CROSS/BLUE SHIELD | Admitting: Obstetrics & Gynecology

## 2017-06-29 ENCOUNTER — Encounter: Payer: Self-pay | Admitting: Obstetrics & Gynecology

## 2017-06-29 VITALS — BP 142/70 | HR 64 | Ht 67.0 in | Wt 160.5 lb

## 2017-06-29 DIAGNOSIS — B373 Candidiasis of vulva and vagina: Secondary | ICD-10-CM | POA: Diagnosis not present

## 2017-06-29 DIAGNOSIS — R300 Dysuria: Secondary | ICD-10-CM

## 2017-06-29 DIAGNOSIS — B3731 Acute candidiasis of vulva and vagina: Secondary | ICD-10-CM

## 2017-06-29 LAB — POCT URINALYSIS DIPSTICK
Ketones, UA: NEGATIVE
LEUKOCYTES UA: NEGATIVE
NITRITE UA: NEGATIVE
PROTEIN UA: NEGATIVE
RBC UA: NEGATIVE

## 2017-06-29 NOTE — Progress Notes (Signed)
Chief Complaint  Patient presents with  . Follow-up    on yeast infection; burning with urination off and on    Blood pressure (!) 142/70, pulse 64, height 5\' 7"  (1.702 m), weight 160 lb 8 oz (72.8 kg).  61 y.o. G3P3000 No LMP recorded. Patient is postmenopausal. The current method of family planning is tubal ligation.  Outpatient Encounter Prescriptions as of 06/29/2017  Medication Sig  . empagliflozin (JARDIANCE) 10 MG TABS tablet Take 10 mg by mouth daily.  . enalapril (VASOTEC) 20 MG tablet Take 1 tablet (20 mg total) by mouth daily.  . Flaxseed, Linseed, (FLAX SEED OIL PO) Take 1 tablet by mouth daily.   Marland Kitchen GARLIC PO Take by mouth daily.   Marland Kitchen glipiZIDE (GLUCOTROL) 5 MG tablet Take 2 tablets (10 mg total) by mouth 2 (two) times daily.  Marland Kitchen LECITHIN PO Take by mouth daily.   . metFORMIN (GLUCOPHAGE) 500 MG tablet TAKE 2 TABLETS IN THE MORNING TAKE 1 TABLET AT NOON TAKE 2 TABLETS IN THE EVENING  . Propylene Glycol 0.6 % SOLN Apply 1 drop to eye daily.  . TURMERIC PO Take by mouth daily.   . [DISCONTINUED] fluconazole (DIFLUCAN) 100 MG tablet Take 1 tablet (100 mg total) by mouth daily.   No facility-administered encounter medications on file as of 06/29/2017.     Subjective Patient is back in for recheck from her visit on June 6 26 2018 She is type II diabetic who came in with itching and burning diagnosed with yeast bulbi this Treated with gentian violet and Diflucan and her symptoms are much better She has occasional problems with dysuria so we'll do a urine culture today  Objective Resolve yeast vulvitis vaginitis no gentian violet needed today Normal exam  Pertinent ROS   Labs or studies     Impression Diagnoses this Encounter::   ICD-10-CM   1. Monilial vulvitis B37.3   2. Burning with urination R30.0 POCT Urinalysis Dipstick    Established relevant diagnosis(es):   Plan/Recommendations: No orders of the defined types were placed in this  encounter.   Labs or Scans Ordered: Orders Placed This Encounter  Procedures  . POCT Urinalysis Dipstick    Management:: No follow-up needed come back in as needed we'll check a urine culture today  Follow up Return if symptoms worsen or fail to improve.      All questions were answered.  Past Medical History:  Diagnosis Date  . Diabetes ([place])   . Hypertension   . PONV (postoperative nausea and vomiting)     Past Surgical History:  Procedure Laterality Date  . COLONOSCOPY   08/08/2007   SLF:  A 5-mm sessile descending colon polyp removed via cold forceps.Normal retroflexed view of the rectum/ Small internal hemorrhoids, PATH: tubular adenoma  . COLONOSCOPY  01/09/2013   Procedure: COLONOSCOPY;  Surgeon: Danie Binder, MD;  Location: [facility];  Service: Endoscopy;  Laterality: N/A;  8:30  . TUBAL LIGATION      OB History    Gravida Para Term Preterm AB Living   3 3 3          SAB TAB Ectopic Multiple Live Births                  Allergies  Allergen Reactions  . Septra [Sulfamethoxazole-Trimethoprim]     Social History   Social History  . Marital status: Married    Spouse name: N/A  . Number of children: N/A  .  Years of education: N/A   Social History Main Topics  . Smoking status: Never Smoker  . Smokeless tobacco: Never Used  . Alcohol use No  . Drug use: No  . Sexual activity: Yes    Birth control/ protection: Post-menopausal   Other Topics Concern  . None   Social History Narrative  . None    Family History  Problem Relation Age of Onset  . Cancer Father        prostate  . Diabetes Sister   . Diabetes Maternal Grandmother   . Diabetes Paternal Aunt   . Colon cancer Neg Hx

## 2017-07-02 LAB — URINE CULTURE

## 2017-07-06 ENCOUNTER — Other Ambulatory Visit: Payer: Self-pay | Admitting: Obstetrics & Gynecology

## 2017-07-06 ENCOUNTER — Telehealth: Payer: Self-pay | Admitting: *Deleted

## 2017-07-06 MED ORDER — CEPHALEXIN 500 MG PO CAPS: 500.0000 mg | ORAL_CAPSULE | Freq: Three times a day (TID) | ORAL | 0 refills | Status: DC

## 2017-07-06 NOTE — Telephone Encounter (Signed)
LMOVM that urine culture was postive for GBS and antibiotic was sent to pharmacy. Advised to take all of medication as prescribed.

## 2017-10-01 LAB — HM DIABETES EYE EXAM

## 2017-11-01 ENCOUNTER — Encounter: Payer: Self-pay | Admitting: Family Medicine

## 2017-11-01 ENCOUNTER — Ambulatory Visit: Payer: BLUE CROSS/BLUE SHIELD | Admitting: Family Medicine

## 2017-11-01 VITALS — BP 130/88 | Ht 67.0 in | Wt 157.0 lb

## 2017-11-01 DIAGNOSIS — M722 Plantar fascial fibromatosis: Secondary | ICD-10-CM | POA: Diagnosis not present

## 2017-11-01 DIAGNOSIS — I10 Essential (primary) hypertension: Secondary | ICD-10-CM

## 2017-11-01 DIAGNOSIS — E119 Type 2 diabetes mellitus without complications: Secondary | ICD-10-CM

## 2017-11-01 DIAGNOSIS — Z23 Encounter for immunization: Secondary | ICD-10-CM | POA: Diagnosis not present

## 2017-11-01 LAB — POCT GLYCOSYLATED HEMOGLOBIN (HGB A1C): HEMOGLOBIN A1C: 7.9

## 2017-11-01 MED ORDER — EMPAGLIFLOZIN 10 MG PO TABS: 10.0000 mg | ORAL_TABLET | Freq: Every day | ORAL | 5 refills | Status: DC

## 2017-11-01 MED ORDER — METFORMIN HCL 500 MG PO TABS: ORAL_TABLET | ORAL | 5 refills | Status: DC

## 2017-11-01 MED ORDER — ENALAPRIL MALEATE 20 MG PO TABS: 20.0000 mg | ORAL_TABLET | Freq: Every day | ORAL | 5 refills | Status: DC

## 2017-11-01 MED ORDER — GLIPIZIDE 5 MG PO TABS: 10.0000 mg | ORAL_TABLET | Freq: Two times a day (BID) | ORAL | 5 refills | Status: DC

## 2017-11-01 NOTE — Patient Instructions (Signed)

## 2017-11-01 NOTE — Progress Notes (Signed)
   Subjective:    Patient ID: Mary Vazquez, female    DOB: June 07, 1956, 61 y.o.   MRN: [id number]  Diabetes  She presents for her follow-up diabetic visit. She has type 2 diabetes mellitus. She is compliant with treatment all of the time. She does not see a podiatrist.Eye exam is current (10/01/17).   Results for orders placed or performed in visit on 11/01/17  POCT glycosylated hemoglobin (Hb A1C)  Result Value Ref Range   Hemoglobin A1C 7.9    Flu vaccine today.   Pain on bottom of left foot.   Patient claims compliance with diabetes medication. No obvious side effects. Reports no substantial low sugar spells. Most numbers are generally in good range when checked fasting. Generally does not miss a dose of medication. Watching diabetic diet closely  Blood pressure medicine and blood pressure levels reviewed today with patient. Compliant with blood pressure medicine. States does not miss a dose. No obvious side effects. Blood pressure generally good when checked elsewhere. Watching salt intake.   glus up more lately, had less exrcise lately because of board olection  Left foot hurts more than right foot, aches at times, hurting , uses ice prn , wearing shoe inserts prn   Adjusting walking lately, with hx of keg injuyr Hurts some wihen first gets up    wears mostly decent shoes     Review of Systems No headache, no major weight loss or weight gain, no chest pain no back pain abdominal pain no change in bowel habits complete ROS otherwise negative     Objective:   Physical Exam Alert and oriented, vitals reviewed and stable, NAD ENT-TM's and ext canals WNL bilat via otoscopic exam Soft palate, tonsils and post pharynx WNL via oropharyngeal exam Neck-symmetric, no masses; thyroid nonpalpable and nontender Pulmonary-no tachypnea or accessory muscle use; Clear without wheezes via auscultation Card--no abnrml murmurs, rhythm reg and rate WNL Carotid pulses symmetric,  without bruits  Distinct tenderness left heel.  No deformity.  Good range of motion.  See diabetic foot exam impression 1 hypertension.       Assessment & Plan:  Good control.  Discussed.  To maintain same medications.  2.  Type 2 diabetes.  A1c suboptimal and discussed.  7.9 patient attributes to recent diminished exercise and attention to diet.  Increase Jardiance recommended to full 25 mg dose.  Patient wishes to work on before doing this.  3.  Plantar fasciitis.  Symptom care.  Heel pads.  Exercise.  Aleve 2 tabs twice daily.  Education given

## 2018-02-17 ENCOUNTER — Encounter: Payer: Self-pay | Admitting: Family Medicine

## 2018-03-27 ENCOUNTER — Encounter: Payer: Self-pay | Admitting: Family Medicine

## 2018-03-28 ENCOUNTER — Other Ambulatory Visit: Payer: Self-pay | Admitting: *Deleted

## 2018-03-28 MED ORDER — FLUCONAZOLE 150 MG PO TABS: ORAL_TABLET | ORAL | 0 refills | Status: DC

## 2018-03-29 ENCOUNTER — Telehealth: Payer: Self-pay | Admitting: *Deleted

## 2018-03-29 NOTE — Telephone Encounter (Signed)
Fax from [facility] Pharm spoke with patient about diabetes care and noticed she has not filled a statin therapy at [facility] in the last 180 days. Pt would like pharm to reach out on her behalf to determine if it is appropriate to start a statin therapy. See form in blue folder in dr Richardson Landry basket.

## 2018-03-30 NOTE — Telephone Encounter (Signed)
New recommendation and too complicated to do over phone, will disc at next reg visit, unless pt wants to come in early to discuss

## 2018-03-31 NOTE — Telephone Encounter (Signed)
Pt states she did not want this and will discuss at next office visit

## 2018-03-31 NOTE — Telephone Encounter (Signed)
Left message to return call 

## 2018-04-26 ENCOUNTER — Ambulatory Visit (HOSPITAL_COMMUNITY)
Admission: RE | Admit: 2018-04-26 | Discharge: 2018-04-26 | Disposition: A | Discharge status: Home / Self Care | Payer: BLUE CROSS/BLUE SHIELD | Source: Ambulatory Visit | Attending: Obstetrics and Gynecology | Admitting: Obstetrics and Gynecology

## 2018-04-26 ENCOUNTER — Other Ambulatory Visit: Payer: Self-pay | Admitting: Obstetrics and Gynecology

## 2018-04-26 DIAGNOSIS — Z1231 Encounter for screening mammogram for malignant neoplasm of breast: Secondary | ICD-10-CM | POA: Insufficient documentation

## 2018-04-27 ENCOUNTER — Encounter (HOSPITAL_COMMUNITY): Payer: Self-pay

## 2018-04-27 ENCOUNTER — Other Ambulatory Visit: Payer: Self-pay | Admitting: Obstetrics and Gynecology

## 2018-04-27 ENCOUNTER — Ambulatory Visit (HOSPITAL_COMMUNITY)
Admission: RE | Admit: 2018-04-27 | Discharge: 2018-04-27 | Disposition: A | Discharge status: Home / Self Care | Payer: BLUE CROSS/BLUE SHIELD | Source: Ambulatory Visit | Attending: Obstetrics and Gynecology | Admitting: Obstetrics and Gynecology

## 2018-04-27 DIAGNOSIS — Z1231 Encounter for screening mammogram for malignant neoplasm of breast: Secondary | ICD-10-CM | POA: Diagnosis not present

## 2018-05-08 ENCOUNTER — Other Ambulatory Visit: Payer: Self-pay | Admitting: Family Medicine

## 2018-05-19 ENCOUNTER — Encounter: Payer: Self-pay | Admitting: Family Medicine

## 2018-05-19 ENCOUNTER — Ambulatory Visit: Payer: BLUE CROSS/BLUE SHIELD | Admitting: Family Medicine

## 2018-05-19 VITALS — BP 122/84 | Ht 67.0 in | Wt 157.0 lb

## 2018-05-19 DIAGNOSIS — E119 Type 2 diabetes mellitus without complications: Secondary | ICD-10-CM

## 2018-05-19 DIAGNOSIS — E7800 Pure hypercholesterolemia, unspecified: Secondary | ICD-10-CM

## 2018-05-19 DIAGNOSIS — Z79899 Other long term (current) drug therapy: Secondary | ICD-10-CM | POA: Diagnosis not present

## 2018-05-19 DIAGNOSIS — E785 Hyperlipidemia, unspecified: Secondary | ICD-10-CM | POA: Diagnosis not present

## 2018-05-19 DIAGNOSIS — I10 Essential (primary) hypertension: Secondary | ICD-10-CM | POA: Diagnosis not present

## 2018-05-19 LAB — POCT GLYCOSYLATED HEMOGLOBIN (HGB A1C): Hemoglobin A1C: 6.9 % — AB (ref 4.0–5.6)

## 2018-05-19 MED ORDER — ENALAPRIL MALEATE 20 MG PO TABS: 20.0000 mg | ORAL_TABLET | Freq: Every day | ORAL | 5 refills | Status: DC

## 2018-05-19 MED ORDER — MUPIROCIN 2 % EX OINT: 1.0000 "application " | TOPICAL_OINTMENT | Freq: Two times a day (BID) | CUTANEOUS | 0 refills | Status: DC

## 2018-05-19 MED ORDER — ATORVASTATIN CALCIUM 20 MG PO TABS: 20.0000 mg | ORAL_TABLET | Freq: Every day | ORAL | 5 refills | Status: DC

## 2018-05-19 MED ORDER — PREDNISONE 20 MG PO TABS: ORAL_TABLET | ORAL | 0 refills | Status: DC

## 2018-05-19 MED ORDER — GLIPIZIDE 5 MG PO TABS: 10.0000 mg | ORAL_TABLET | Freq: Two times a day (BID) | ORAL | 5 refills | Status: DC

## 2018-05-19 MED ORDER — EMPAGLIFLOZIN 10 MG PO TABS: 10.0000 mg | ORAL_TABLET | Freq: Every day | ORAL | 5 refills | Status: DC

## 2018-05-19 MED ORDER — METFORMIN HCL 500 MG PO TABS
ORAL_TABLET | ORAL | 5 refills | Status: DC
Start: 2018-05-19 — End: 2018-11-24

## 2018-05-19 NOTE — Progress Notes (Signed)
   Subjective:    Patient ID: Mary Vazquez, female    DOB: 09/02/1956, 62 y.o.   MRN: [id number]  Diabetes  She presents for her follow-up diabetic visit. She has type 2 diabetes mellitus. She is compliant with treatment all of the time. She does not see a podiatrist.Eye exam is current (oct 2018).   Pt states no concerns today.   Results for orders placed or performed in visit on 05/19/18  POCT glycosylated hemoglobin (Hb A1C)  Result Value Ref Range   Hemoglobin A1C 6.9 (A) 4.0 - 5.6 %   HbA1c, POC (prediabetic range)  5.7 - 6.4 %   HbA1c, POC (controlled diabetic range)  0.0 - 7.0 %   140 to 160  diet overall pretty good   execising well five d oer wk walking reg    Patient claims compliance with diabetes medication. No obvious side effects. Reports no substantial low sugar spells. Most numbers are generally in good range when checked fasting. Generally does not miss a dose of medication. Watching diabetic diet closely  Blood pressure medicine and blood pressure levels reviewed today with patient. Compliant with blood pressure medicine. States does not miss a dose. No obvious side effects. Blood pressure generally good when checked elsewhere. Watching salt intake.    Patient also arrives for discussion regarding her lipid panel.  Her insurance company called in 6 weeks ago.  See phone message.  Patient's lipid profile has always been quite good due to her strong HDL.  Current recommendations regarding lipid management for diabetics discussed with patient at length      Review of Systems No headache, no major weight loss or weight gain, no chest pain no back pain abdominal pain no change in bowel habits complete ROS otherwise negative     Objective:   Physical Exam  Alert and oriented, vitals reviewed and stable, NAD ENT-TM's and ext canals WNL bilat via otoscopic exam Soft palate, tonsils and post pharynx WNL via oropharyngeal exam Neck-symmetric, no masses;  thyroid nonpalpable and nontender Pulmonary-no tachypnea or accessory muscle use; Clear without wheezes via auscultation Card--no abnrml murmurs, rhythm reg and rate WNL Carotid pulses symmetric, without bruits       Assessment & Plan:  1 impression type 2 diabetes.  A1c 6.9% control considered good rationale discussed to maintain same current therapeutic approach  2.  Hypertension control blood pressure excellent repeat.  Compliance discussed maintain same  3.  Hyperlipidemia.  Overall profile quite good due to strong HDL experts recommend initiating statin therapy.  Rationale discussed.  Initiate generic Lipitor today rationale discussed  Blood work discussed/medications refilled diet exercise discussed recheck in 6 months  Greater than 50% of this 25 minute face to face visit was spent in counseling and discussion and coordination of care regarding the above diagnosis/diagnosies

## 2018-05-30 DIAGNOSIS — E785 Hyperlipidemia, unspecified: Secondary | ICD-10-CM | POA: Diagnosis not present

## 2018-05-30 DIAGNOSIS — Z79899 Other long term (current) drug therapy: Secondary | ICD-10-CM | POA: Diagnosis not present

## 2018-05-31 LAB — LIPID PANEL
CHOLESTEROL TOTAL: 164 mg/dL (ref 100–199)
Chol/HDL Ratio: 2.5 ratio (ref 0.0–4.4)
HDL: 66 mg/dL (ref >39)
LDL CALC: 86 mg/dL (ref 0–99)
TRIGLYCERIDES: 61 mg/dL (ref 0–149)
VLDL CHOLESTEROL CAL: 12 mg/dL (ref 5–40)

## 2018-05-31 LAB — BASIC METABOLIC PANEL
BUN/Creatinine Ratio: 16 (ref 12–28)
BUN: 15 mg/dL (ref 8–27)
CHLORIDE: 103 mmol/L (ref 96–106)
CO2: 24 mmol/L (ref 20–29)
Calcium: 9.7 mg/dL (ref 8.7–10.3)
Creatinine, Ser: 0.92 mg/dL (ref 0.57–1.00)
GFR calc non Af Amer: 66 mL/min/{1.73_m2} (ref >59)
GFR, EST AFRICAN AMERICAN: 77 mL/min/{1.73_m2} (ref >59)
GLUCOSE: 197 mg/dL — AB (ref 65–99)
POTASSIUM: 4.4 mmol/L (ref 3.5–5.2)
Sodium: 142 mmol/L (ref 134–144)

## 2018-05-31 LAB — HEPATIC FUNCTION PANEL
ALT: 22 IU/L (ref 0–32)
AST: 24 IU/L (ref 0–40)
Albumin: 4.8 g/dL (ref 3.6–4.8)
Alkaline Phosphatase: 63 IU/L (ref 39–117)
Bilirubin Total: 0.3 mg/dL (ref 0.0–1.2)
Bilirubin, Direct: 0.08 mg/dL (ref 0.00–0.40)
Total Protein: 7 g/dL (ref 6.0–8.5)

## 2018-06-05 ENCOUNTER — Encounter: Payer: Self-pay | Admitting: Family Medicine

## 2018-06-14 ENCOUNTER — Encounter: Payer: Self-pay | Admitting: Family Medicine

## 2018-07-20 IMAGING — MG DIGITAL SCREENING BILATERAL MAMMOGRAM WITH CAD
4 series · 4 of 4 positions shown · non-contrast
Comparison: Previous exam(s).

CLINICAL DATA: Screening.

EXAM:
DIGITAL SCREENING BILATERAL MAMMOGRAM WITH CAD

[R CC]
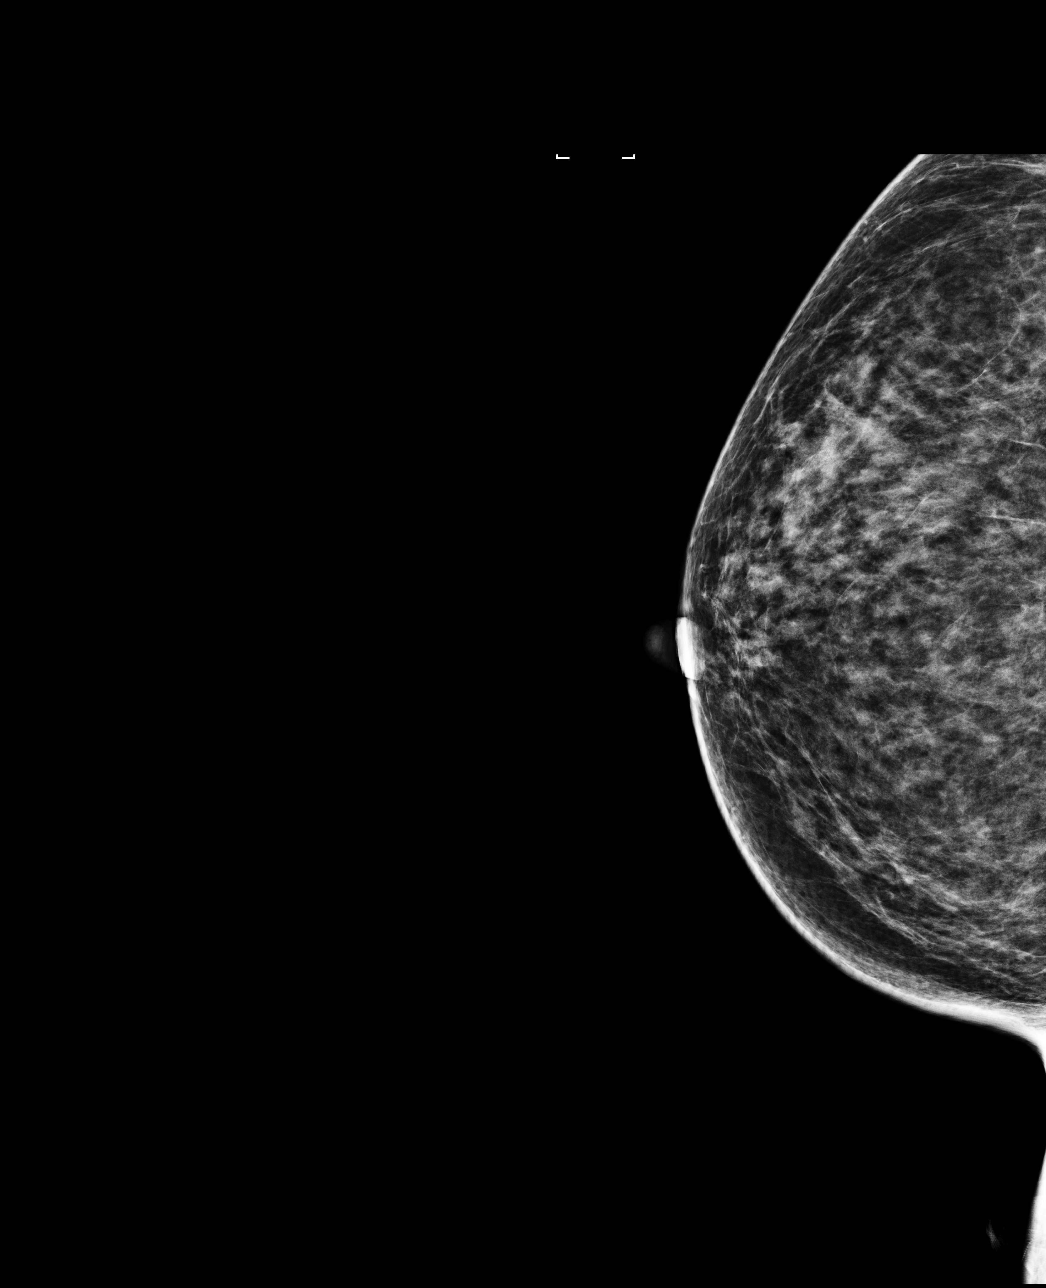

[L MLO]
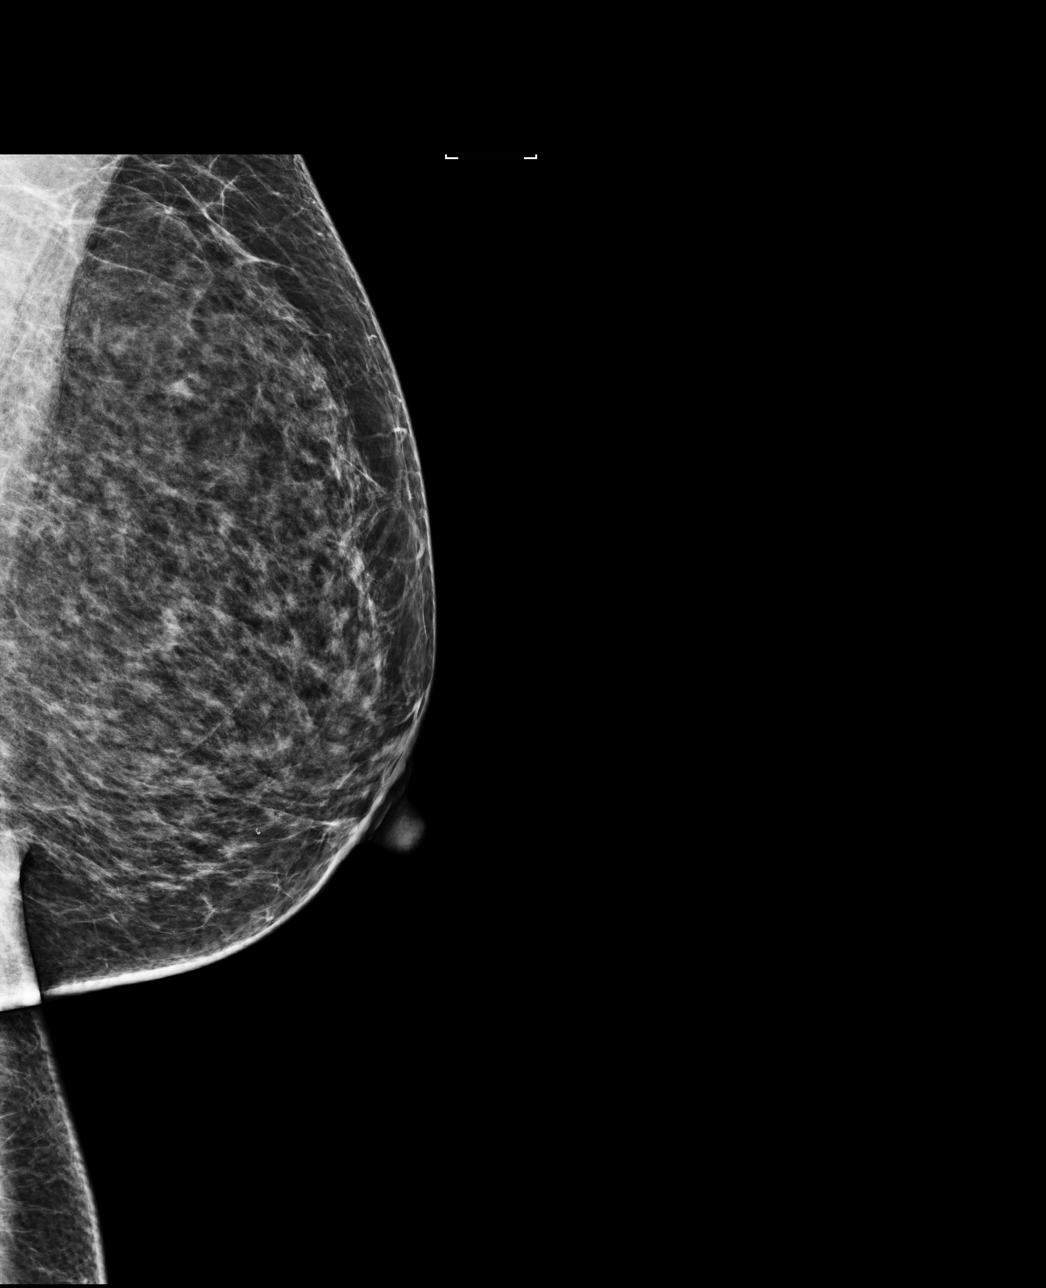

[L CC]
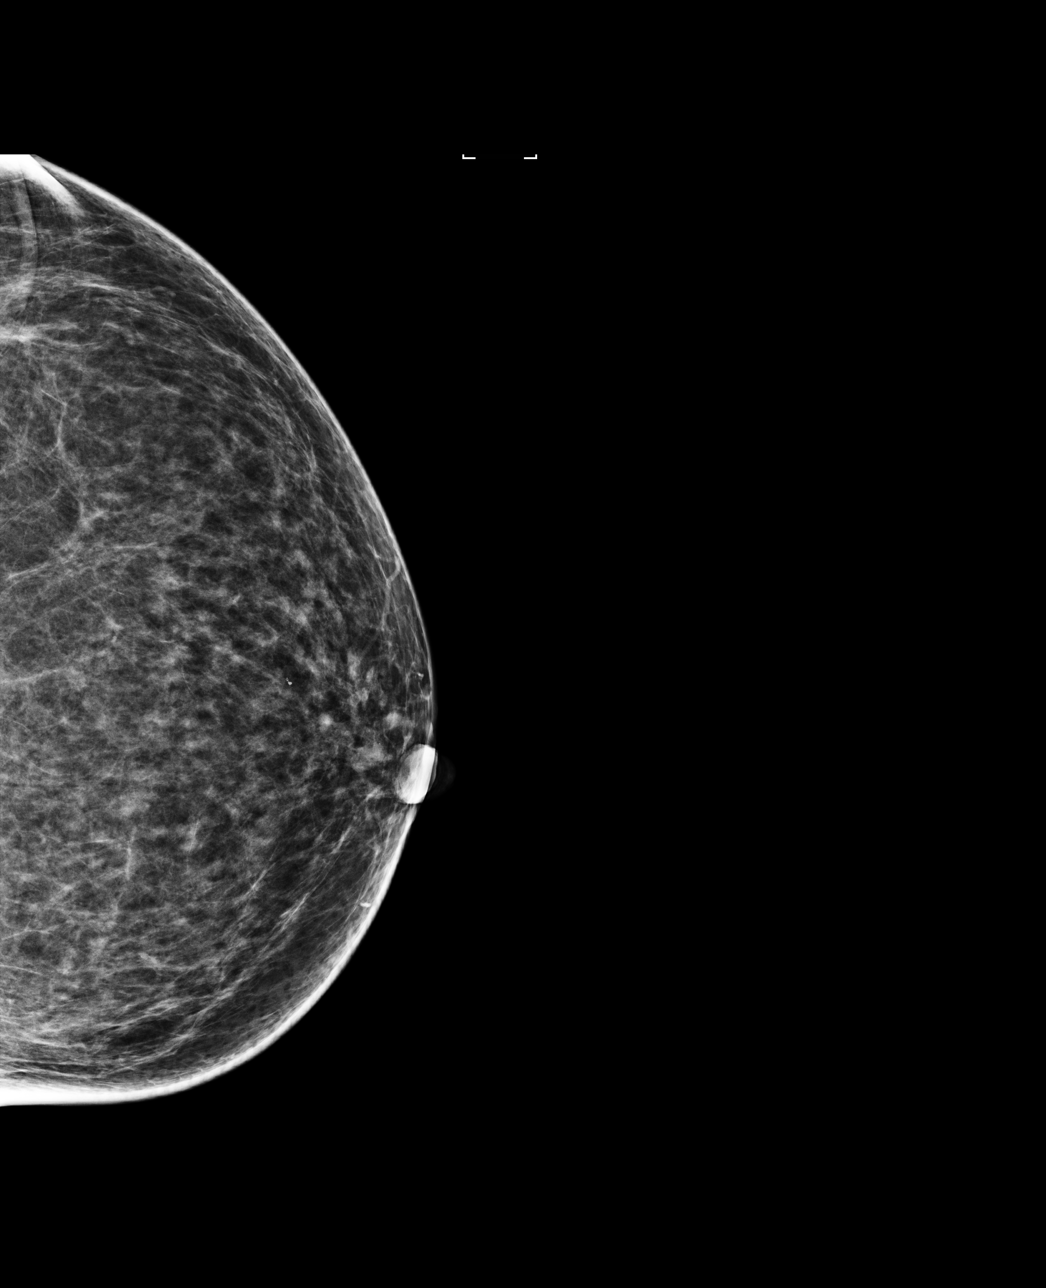

[R MLO]
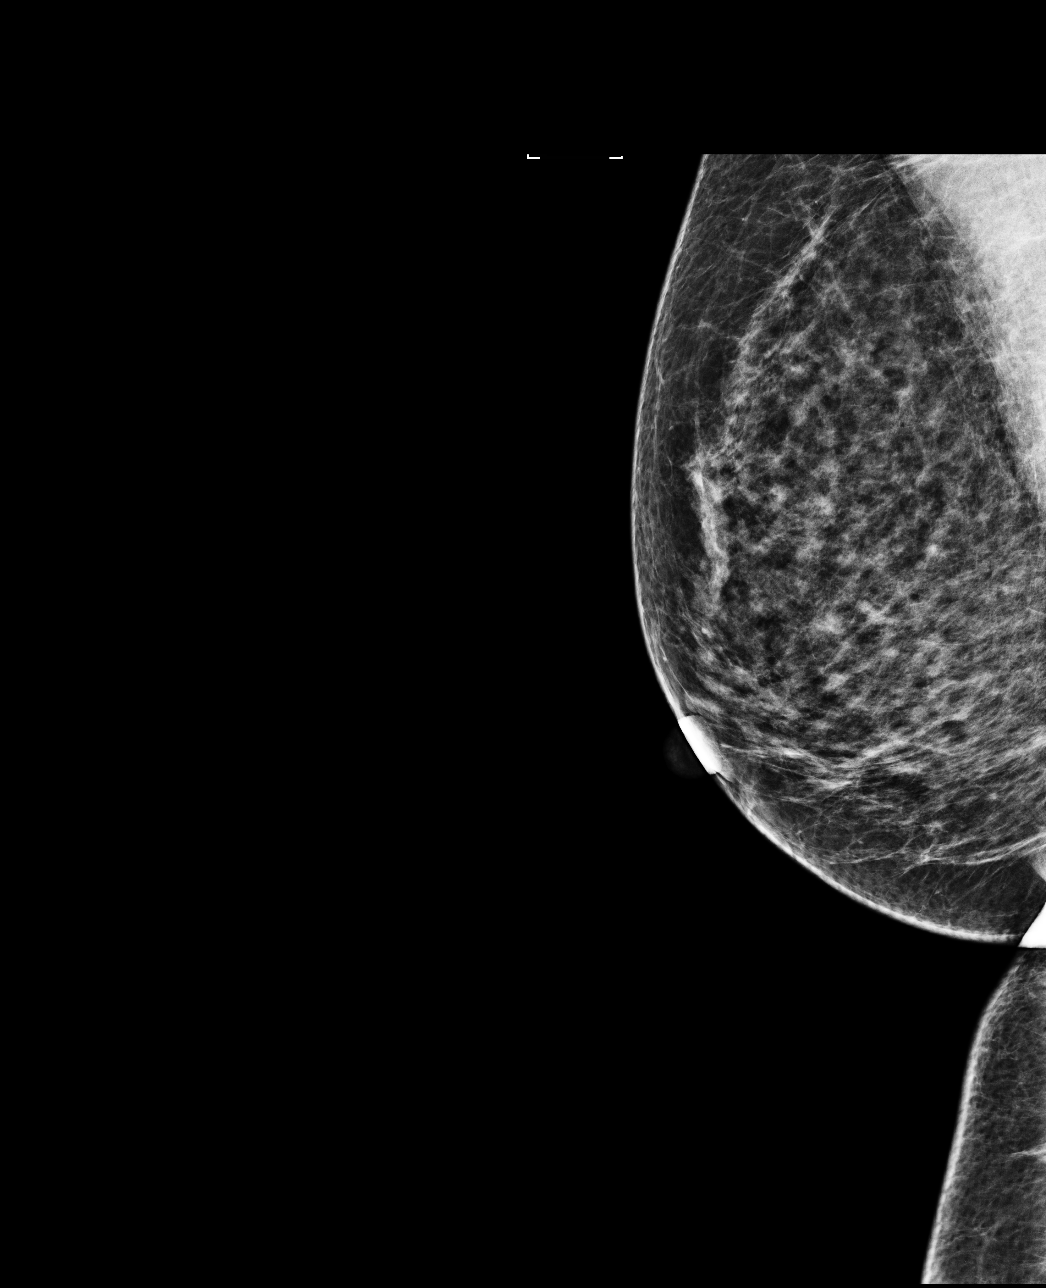

[4 of 4 positions shown; findings below may reference images not displayed]

ACR Breast Density Category b: There are scattered areas of
fibroglandular density.
FINDINGS: There are no findings suspicious for malignancy. Images were
processed with CAD.
IMPRESSION: No mammographic evidence of malignancy. A result letter of this
screening mammogram will be mailed directly to the patient.

RECOMMENDATION:
Screening mammogram in one year. (Code:AS-G-LCT)

BI-RADS CATEGORY  1: Negative.

## 2018-09-07 ENCOUNTER — Encounter: Payer: Self-pay | Admitting: Family Medicine

## 2018-09-08 ENCOUNTER — Encounter: Payer: Self-pay | Admitting: Family Medicine

## 2018-09-08 ENCOUNTER — Ambulatory Visit: Payer: BLUE CROSS/BLUE SHIELD | Admitting: Family Medicine

## 2018-09-08 VITALS — BP 124/86 | Temp 98.3°F | Ht 67.0 in | Wt 160.0 lb

## 2018-09-08 DIAGNOSIS — M722 Plantar fascial fibromatosis: Secondary | ICD-10-CM | POA: Diagnosis not present

## 2018-09-08 NOTE — Progress Notes (Signed)
   Subjective:    Patient ID: Mary Vazquez, female    DOB: 1956-05-18, 62 y.o.   MRN: [id number]   Foot Pain   This is a new problem. The current episode started yesterday. Associated symptoms comments: Pain and swelling in left heel.   Drainage and pain right great toe. Lasted about a couple of weeks or more. No pain or drainage now but toenail is coming off.   Inflammation and tendeness of the left heel  Started in the last   Had pus pocket in te right great toe   Was wondring if injure  No limping or famvoring the  Review of Systems No headache, no major weight loss or weight gain, no chest pain no back pain abdominal pain no change in bowel habits complete ROS otherwise negative     Objective:   Physical Exam  Alert vitals stable, NAD. Blood pressure good on repeat. HEENT normal. Lungs clear. Heart regular rate and rhythm. Feet excellent pulses bilateral/healing paronychial infection right great toe/diffusely tender left heel      Assessment & Plan:  Impression plantar fasciitis.  Discussed at length.  PRN anti-inflammatory medicine.  Heel cups.  Stretching exercise discussed no evidence of infection.

## 2018-09-08 NOTE — Telephone Encounter (Signed)
Left message to return call 

## 2018-09-08 NOTE — Telephone Encounter (Signed)
Pt returned call. Pt states her heel is tingling and still a bit swollen. Pt transferred up front for appt.

## 2018-09-19 ENCOUNTER — Other Ambulatory Visit: Payer: Self-pay

## 2018-09-19 ENCOUNTER — Other Ambulatory Visit: Payer: Self-pay | Admitting: Family Medicine

## 2018-09-19 MED ORDER — CANAGLIFLOZIN 100 MG PO TABS: ORAL_TABLET | ORAL | 5 refills | Status: DC

## 2018-10-04 ENCOUNTER — Encounter: Payer: Self-pay | Admitting: Family Medicine

## 2018-11-08 DIAGNOSIS — E119 Type 2 diabetes mellitus without complications: Secondary | ICD-10-CM | POA: Diagnosis not present

## 2018-11-08 LAB — HM DIABETES EYE EXAM

## 2018-11-12 ENCOUNTER — Other Ambulatory Visit: Payer: Self-pay | Admitting: Family Medicine

## 2018-11-21 ENCOUNTER — Encounter: Payer: Self-pay | Admitting: Family Medicine

## 2018-11-24 ENCOUNTER — Ambulatory Visit: Payer: BLUE CROSS/BLUE SHIELD | Admitting: Family Medicine

## 2018-11-24 ENCOUNTER — Encounter: Payer: Self-pay | Admitting: Family Medicine

## 2018-11-24 VITALS — BP 132/90 | Ht 67.0 in | Wt 158.6 lb

## 2018-11-24 DIAGNOSIS — Z79899 Other long term (current) drug therapy: Secondary | ICD-10-CM

## 2018-11-24 DIAGNOSIS — I10 Essential (primary) hypertension: Secondary | ICD-10-CM | POA: Diagnosis not present

## 2018-11-24 DIAGNOSIS — E119 Type 2 diabetes mellitus without complications: Secondary | ICD-10-CM

## 2018-11-24 DIAGNOSIS — E785 Hyperlipidemia, unspecified: Secondary | ICD-10-CM

## 2018-11-24 LAB — POCT GLYCOSYLATED HEMOGLOBIN (HGB A1C): Hemoglobin A1C: 7.5 % — AB (ref 4.0–5.6)

## 2018-11-24 MED ORDER — GLIPIZIDE 5 MG PO TABS: 10.0000 mg | ORAL_TABLET | Freq: Two times a day (BID) | ORAL | 5 refills | Status: DC

## 2018-11-24 MED ORDER — AMOXICILLIN 500 MG PO CAPS: ORAL_CAPSULE | ORAL | 0 refills | Status: DC

## 2018-11-24 MED ORDER — ENALAPRIL MALEATE 20 MG PO TABS: 20.0000 mg | ORAL_TABLET | Freq: Every day | ORAL | 5 refills | Status: DC

## 2018-11-24 MED ORDER — METFORMIN HCL 500 MG PO TABS: ORAL_TABLET | ORAL | 5 refills | Status: DC

## 2018-11-24 MED ORDER — CANAGLIFLOZIN 100 MG PO TABS: ORAL_TABLET | ORAL | 5 refills | Status: DC

## 2018-11-24 MED ORDER — ATORVASTATIN CALCIUM 20 MG PO TABS: 20.0000 mg | ORAL_TABLET | Freq: Every day | ORAL | 5 refills | Status: DC

## 2018-11-24 NOTE — Progress Notes (Signed)
   Subjective:    Patient ID: Mary Vazquez, female    DOB: 12-Mar-1956, 62 y.o.   MRN: [id number]  Diabetes  She presents for her follow-up diabetic visit. She has type 2 diabetes mellitus. There are no hypoglycemic associated symptoms. There are no diabetic associated symptoms. There are no hypoglycemic complications. There are no diabetic complications. She does not see a podiatrist.Eye exam is current.   Blood pressure medicine and blood pressure levels reviewed today with patient. Compliant with blood pressure medicine. States does not miss a dose. No obvious side effects. Blood pressure generally good when checked elsewhere. Watching salt intake.  Patient continues to take lipid medication regularly. No obvious side effects from it. Generally does not miss a dose. Prior blood work results are reviewed with patient. Patient continues to work on fat intake in diet . Patient claims compliance with diabetes medication. No obvious side effects. Reports no substantial low sugar spells. Most numbers are generally in good range when checked fasting. Generally does not miss a dose of medication. Watching diabetic diet closely   Positive cong and drange and guniness     Review of Systems No headache, no major weight loss or weight gain, no chest pain no back pain abdominal pain no change in bowel habits complete ROS otherwise negative     Objective:   Physical Exam Alert and oriented, vitals reviewed and stable, NAD ENT-TM's and ext canals WNL bilat via otoscopic exam Soft palate, tonsils and post pharynx WNL via oropharyngeal exam Neck-symmetric, no masses; thyroid nonpalpable and nontender Pulmonary-no tachypnea or accessory muscle use; Clear without wheezes via auscultation Card--no abnrml murmurs, rhythm reg and rate WNL Carotid pulses symmetric, without bruits        Assessment & Plan:  Impression #1 type 2 diabetes.  Suboptimal control discussed we will increase her  Invokana to 2 tablets each morning diet exercise discussed  2.  Hypertension good control discussed maintain same meds  3.  Hyperlipidemia status uncertain discussed we will evaluate maintain meds  4.  Rhinosinusitis will cover with antibiotics rationale discussed  Follow-up in 6 months

## 2018-11-28 ENCOUNTER — Other Ambulatory Visit: Payer: Self-pay | Admitting: Family Medicine

## 2018-11-28 DIAGNOSIS — Z79899 Other long term (current) drug therapy: Secondary | ICD-10-CM | POA: Diagnosis not present

## 2018-11-28 DIAGNOSIS — E785 Hyperlipidemia, unspecified: Secondary | ICD-10-CM | POA: Diagnosis not present

## 2018-11-29 LAB — HEPATIC FUNCTION PANEL
ALK PHOS: 67 IU/L (ref 39–117)
ALT: 17 IU/L (ref 0–32)
AST: 22 IU/L (ref 0–40)
Albumin: 4.5 g/dL (ref 3.6–4.8)
BILIRUBIN, DIRECT: 0.1 mg/dL (ref 0.00–0.40)
Bilirubin Total: 0.3 mg/dL (ref 0.0–1.2)
TOTAL PROTEIN: 6.9 g/dL (ref 6.0–8.5)

## 2018-11-29 LAB — LIPID PANEL
Chol/HDL Ratio: 1.8 ratio (ref 0.0–4.4)
Cholesterol, Total: 135 mg/dL (ref 100–199)
HDL: 73 mg/dL (ref >39)
LDL Calculated: 50 mg/dL (ref 0–99)
Triglycerides: 58 mg/dL (ref 0–149)
VLDL Cholesterol Cal: 12 mg/dL (ref 5–40)

## 2018-12-05 ENCOUNTER — Encounter: Payer: Self-pay | Admitting: Family Medicine

## 2018-12-10 ENCOUNTER — Encounter: Payer: Self-pay | Admitting: Family Medicine

## 2018-12-21 ENCOUNTER — Encounter: Payer: Self-pay | Admitting: Family Medicine

## 2018-12-21 MED ORDER — FLUCONAZOLE 150 MG PO TABS: ORAL_TABLET | ORAL | 0 refills | Status: DC

## 2019-01-28 ENCOUNTER — Encounter: Payer: Self-pay | Admitting: Family Medicine

## 2019-02-02 ENCOUNTER — Encounter: Payer: Self-pay | Admitting: Family Medicine

## 2019-02-02 ENCOUNTER — Ambulatory Visit: Payer: BLUE CROSS/BLUE SHIELD | Admitting: Family Medicine

## 2019-02-02 VITALS — BP 138/88 | Temp 97.8°F | Ht 67.0 in | Wt 157.1 lb

## 2019-02-02 DIAGNOSIS — B373 Candidiasis of vulva and vagina: Secondary | ICD-10-CM | POA: Diagnosis not present

## 2019-02-02 DIAGNOSIS — R300 Dysuria: Secondary | ICD-10-CM | POA: Diagnosis not present

## 2019-02-02 DIAGNOSIS — J329 Chronic sinusitis, unspecified: Secondary | ICD-10-CM | POA: Diagnosis not present

## 2019-02-02 DIAGNOSIS — E119 Type 2 diabetes mellitus without complications: Secondary | ICD-10-CM

## 2019-02-02 DIAGNOSIS — B3731 Acute candidiasis of vulva and vagina: Secondary | ICD-10-CM

## 2019-02-02 LAB — POCT URINALYSIS DIPSTICK
Blood, UA: NEGATIVE
Glucose, UA: POSITIVE — AB
Spec Grav, UA: 1.015 (ref 1.010–1.025)
pH, UA: 5 (ref 5.0–8.0)

## 2019-02-02 MED ORDER — KETOCONAZOLE 2 % EX CREA: 1.0000 "application " | TOPICAL_CREAM | Freq: Two times a day (BID) | CUTANEOUS | 4 refills | Status: DC

## 2019-02-02 MED ORDER — FLUCONAZOLE 150 MG PO TABS: ORAL_TABLET | ORAL | 6 refills | Status: DC

## 2019-02-02 MED ORDER — AZITHROMYCIN 250 MG PO TABS: ORAL_TABLET | ORAL | 0 refills | Status: DC

## 2019-02-02 NOTE — Progress Notes (Signed)
   Subjective:    Patient ID: Mary Vazquez, female    DOB: 04/22/1956, 63 y.o.   MRN: [id number] Patient arrives with multiple concerns HPI Patient is here today due to vaginal itching.  Patient notes that she has a rash on external genitalia.  No obvious discharge.  No dysuria.  No urinary frequency.   She also states she has sinus drainage,cough,sore throat.  Cough and drange and cong Neg fever  Neg  Trouble with vom n diarrhe  All on going for the last week.  She has been using peroxide to swish and gargle, also using Claritin. Results for orders placed or performed in visit on 02/02/19  POCT urinalysis dipstick  Result Value Ref Range   Color, UA     Clarity, UA     Glucose, UA Positive (A) Negative   Bilirubin, UA     Ketones, UA     Spec Grav, UA 1.015 1.010 - 1.025   Blood, UA Negative    pH, UA 5.0 5.0 - 8.0   Protein, UA     Urobilinogen, UA     Nitrite, UA     Leukocytes, UA Small (1+) (A) Negative   Appearance     Odor     Patient is on Invokana.  Review of Systems No headache, no major weight loss or weight gain, no chest pain no back pain abdominal pain no change in bowel habits complete ROS otherwise negative     Objective:   Physical Exam Alert and oriented, vitals reviewed and stable, NAD ENT-TM's and ext canals WNL positive nasal congestion and fullness in the sinus area with normal bilat via otoscopic exam Soft palate, tonsils and post pharynx WNL via oropharyngeal exam Neck-symmetric, no masses; thyroid nonpalpable and nontender Pulmonary-no tachypnea or accessory muscle use; Clear without wheezes via auscultation Card--no abnrml murmurs, rhythm reg and rate WNL Carotid pulses symmetric, without bruits External genitalia slight erythema and scaliness along labia discharge obtained  Under the microscope yeast seen.  Urinalysis within normal limits       Assessment & Plan:  Impression 1 acute rhinosinusitis/pharyngitis.  Discussed.   Antibiotics prescribed.  2.  Yeast vaginitis accompanied by dermatitis.  Management discussed.  Recurrent nature of this discussed.  See problem #3.  Due to this.  Patient is at increased risk we will go ahead and write refills on both Diflucan and ketoconazole prescribed  3.  Chronic Invokana use.  Urinalysis as expected reveals glucosuria.  This increases risk #2.  Discussed.  However medication very helpful for diabetes helping keep patient off insulin also discussed will maintain.

## 2019-04-20 ENCOUNTER — Encounter: Payer: Self-pay | Admitting: Family Medicine

## 2019-05-02 ENCOUNTER — Other Ambulatory Visit (HOSPITAL_COMMUNITY): Payer: Self-pay | Admitting: Obstetrics and Gynecology

## 2019-05-02 DIAGNOSIS — Z1231 Encounter for screening mammogram for malignant neoplasm of breast: Secondary | ICD-10-CM

## 2019-05-05 ENCOUNTER — Encounter: Payer: Self-pay | Admitting: Family Medicine

## 2019-05-05 ENCOUNTER — Other Ambulatory Visit: Payer: Self-pay

## 2019-05-05 ENCOUNTER — Ambulatory Visit (INDEPENDENT_AMBULATORY_CARE_PROVIDER_SITE_OTHER): Payer: BLUE CROSS/BLUE SHIELD | Admitting: Family Medicine

## 2019-05-05 DIAGNOSIS — I10 Essential (primary) hypertension: Secondary | ICD-10-CM

## 2019-05-05 DIAGNOSIS — E785 Hyperlipidemia, unspecified: Secondary | ICD-10-CM | POA: Diagnosis not present

## 2019-05-05 DIAGNOSIS — E119 Type 2 diabetes mellitus without complications: Secondary | ICD-10-CM | POA: Diagnosis not present

## 2019-05-05 MED ORDER — ENALAPRIL MALEATE 20 MG PO TABS: 20.0000 mg | ORAL_TABLET | Freq: Every day | ORAL | 5 refills | Status: DC

## 2019-05-05 MED ORDER — METFORMIN HCL 500 MG PO TABS: ORAL_TABLET | ORAL | 5 refills | Status: DC

## 2019-05-05 MED ORDER — ATORVASTATIN CALCIUM 20 MG PO TABS: 20.0000 mg | ORAL_TABLET | Freq: Every day | ORAL | 5 refills | Status: DC

## 2019-05-05 MED ORDER — CANAGLIFLOZIN 100 MG PO TABS: ORAL_TABLET | ORAL | 5 refills | Status: DC

## 2019-05-05 MED ORDER — GLIPIZIDE 5 MG PO TABS: 10.0000 mg | ORAL_TABLET | Freq: Two times a day (BID) | ORAL | 5 refills | Status: DC

## 2019-05-05 NOTE — Progress Notes (Signed)
   Subjective:    Patient ID: Mary Vazquez, female    DOB: 1956-11-27, 63 y.o.   MRN: [id number] Patient calls with numerous concerns  Audio 1 Diabetes  She presents for her follow-up diabetic visit. She has type 2 diabetes mellitus. Risk factors for coronary artery disease include dyslipidemia, hypertension and post-menopausal. Current diabetic treatment includes oral agent (triple therapy). She is compliant with treatment all of the time. Her weight is stable. She is following a diabetic diet.   FBS this am 123 per patient  Overall glu numb decent  Virtual Visit via Video Note  I connected with Mary Vazquez on 05/05/19 at  9:30 AM EDT by a video enabled telemedicine application and verified that I am speaking with the correct person using two identifiers.  Location: Patient: home Provider: office    I discussed the limitations of evaluation and management by telemedicine and the availability of in person appointments. The patient expressed understanding and agreed to proceed.  History of Present Illness:    Observations/Objective:   Assessment and Plan:   Follow Up Instructions:    I discussed the assessment and treatment plan with the patient. The patient was provided an opportunity to ask questions and all were answered. The patient agreed with the plan and demonstrated an understanding of the instructions.   The patient was advised to call back or seek an in-person evaluation if the symptoms worsen or if the condition fails to improve as anticipated.  I provided 25 minutes of non-face-to-face time during this encounter.   Bps overall good 133 syst or 75  Walking reg on the trails  Patient claims compliance with diabetes medication. No obvious side effects. Reports no substantial low sugar spells. Most numbers are generally in good range when checked fasting. Generally does not miss a dose of medication. Watching diabetic diet closely  Blood pressure  medicine and blood pressure levels reviewed today with patient. Compliant with blood pressure medicine. States does not miss a dose. No obvious side effects. Blood pressure generally good when checked elsewhere. Watching salt intake.   Patient continues to take lipid medication regularly. No obvious side effects from it. Generally does not miss a dose. Prior blood work results are reviewed with patient. Patient continues to work on fat intake in diet    Review of Systems No headache, no major weight loss or weight gain, no chest pain no back pain abdominal pain no change in bowel habits complete ROS otherwise negative     Objective:   Physical Exam   Virtual visit     Assessment & Plan:  Impression 1 type 2 diabetes.  Apparent good control discussed glucose is good diet discussed compliance with meds discussed  2.  Hypertension.  Blood pressure good control apparently compliance discussed salt intake discussed  3.  Hyperlipidemia.  Importance of control discussed.  Medication compliance discussed maintain  No blood work discussed and patient understands  Follow-up in 6 months diet exercise discussed  Greater than 50% of this 25 minute face to face visit was spent in counseling and discussion and coordination of care regarding the above diagnosis/diagnosies

## 2019-05-09 ENCOUNTER — Other Ambulatory Visit: Payer: Self-pay | Admitting: *Deleted

## 2019-05-09 MED ORDER — DAPAGLIFLOZIN PROPANEDIOL 10 MG PO TABS: 10.0000 mg | ORAL_TABLET | Freq: Every day | ORAL | 5 refills | Status: DC

## 2019-05-12 NOTE — Telephone Encounter (Signed)
See other message

## 2019-05-15 MED ORDER — EMPAGLIFLOZIN 10 MG PO TABS: ORAL_TABLET | ORAL | 0 refills | Status: DC

## 2019-05-19 ENCOUNTER — Other Ambulatory Visit: Payer: Self-pay

## 2019-05-19 ENCOUNTER — Ambulatory Visit (HOSPITAL_COMMUNITY)
Admission: RE | Admit: 2019-05-19 | Discharge: 2019-05-19 | Disposition: A | Discharge status: Home / Self Care | Payer: BLUE CROSS/BLUE SHIELD | Source: Ambulatory Visit | Attending: Obstetrics and Gynecology | Admitting: Obstetrics and Gynecology

## 2019-05-19 DIAGNOSIS — Z1231 Encounter for screening mammogram for malignant neoplasm of breast: Secondary | ICD-10-CM | POA: Insufficient documentation

## 2019-05-22 ENCOUNTER — Other Ambulatory Visit (HOSPITAL_COMMUNITY): Payer: Self-pay | Admitting: Obstetrics and Gynecology

## 2019-05-22 DIAGNOSIS — R928 Other abnormal and inconclusive findings on diagnostic imaging of breast: Secondary | ICD-10-CM

## 2019-05-30 ENCOUNTER — Ambulatory Visit (HOSPITAL_COMMUNITY)
Admission: RE | Admit: 2019-05-30 | Discharge: 2019-05-30 | Disposition: A | Discharge status: Home / Self Care | Payer: BLUE CROSS/BLUE SHIELD | Source: Ambulatory Visit | Attending: Obstetrics and Gynecology | Admitting: Obstetrics and Gynecology

## 2019-05-30 ENCOUNTER — Other Ambulatory Visit: Payer: Self-pay

## 2019-05-30 ENCOUNTER — Encounter: Payer: Self-pay | Admitting: Family Medicine

## 2019-05-30 ENCOUNTER — Ambulatory Visit (HOSPITAL_COMMUNITY): Admission: RE | Admit: 2019-05-30 | Payer: BLUE CROSS/BLUE SHIELD | Source: Ambulatory Visit

## 2019-05-30 DIAGNOSIS — R928 Other abnormal and inconclusive findings on diagnostic imaging of breast: Secondary | ICD-10-CM | POA: Diagnosis not present

## 2019-06-09 ENCOUNTER — Encounter: Payer: Self-pay | Admitting: Family Medicine

## 2019-06-09 ENCOUNTER — Other Ambulatory Visit: Payer: Self-pay | Admitting: *Deleted

## 2019-06-09 MED ORDER — JARDIANCE 10 MG PO TABS: ORAL_TABLET | ORAL | 2 refills | Status: DC

## 2019-06-09 NOTE — Telephone Encounter (Signed)
Called pt to clarify. She states her insurance would not cover invokana but would cover farxiga and jardiance. It was switched to [place] but it was $150 copay. She called back and dr Richardson Landry switched to jardiance. It was sent in on 05/15/19 but pt states pharm never got the rx so she just paid the $150 for farxiga. She has a few more days left on farxiga but wants jardiance because it is cheaper. I resent the rx that was sent in on 05/15/19 with a note to pharm to cancel farxiga and I called [facility] to make sure they received the [facility] and they did and it went through with a $25 copay. Pt was notified.

## 2019-10-09 ENCOUNTER — Other Ambulatory Visit: Payer: Self-pay | Admitting: Family Medicine

## 2019-10-12 ENCOUNTER — Encounter: Payer: Self-pay | Admitting: Family Medicine

## 2019-10-12 ENCOUNTER — Other Ambulatory Visit: Payer: Self-pay

## 2019-10-12 DIAGNOSIS — Z20822 Contact with and (suspected) exposure to covid-19: Secondary | ICD-10-CM

## 2019-10-13 LAB — NOVEL CORONAVIRUS, NAA: SARS-CoV-2, NAA: NOT DETECTED

## 2019-10-19 ENCOUNTER — Ambulatory Visit (INDEPENDENT_AMBULATORY_CARE_PROVIDER_SITE_OTHER): Payer: BC Managed Care – PPO | Admitting: Family Medicine

## 2019-10-19 DIAGNOSIS — E119 Type 2 diabetes mellitus without complications: Secondary | ICD-10-CM

## 2019-10-19 DIAGNOSIS — I10 Essential (primary) hypertension: Secondary | ICD-10-CM

## 2019-10-19 DIAGNOSIS — Z13220 Encounter for screening for lipoid disorders: Secondary | ICD-10-CM

## 2019-10-19 DIAGNOSIS — Z79899 Other long term (current) drug therapy: Secondary | ICD-10-CM

## 2019-10-19 DIAGNOSIS — M542 Cervicalgia: Secondary | ICD-10-CM

## 2019-10-19 DIAGNOSIS — S161XXA Strain of muscle, fascia and tendon at neck level, initial encounter: Secondary | ICD-10-CM

## 2019-10-19 MED ORDER — GLIPIZIDE 5 MG PO TABS: 10.0000 mg | ORAL_TABLET | Freq: Two times a day (BID) | ORAL | 5 refills | Status: DC

## 2019-10-19 MED ORDER — ENALAPRIL MALEATE 20 MG PO TABS: 20.0000 mg | ORAL_TABLET | Freq: Every day | ORAL | 5 refills | Status: DC

## 2019-10-19 MED ORDER — METFORMIN HCL 500 MG PO TABS: ORAL_TABLET | ORAL | 5 refills | Status: DC

## 2019-10-19 MED ORDER — JARDIANCE 10 MG PO TABS: ORAL_TABLET | ORAL | 5 refills | Status: DC

## 2019-10-19 MED ORDER — ATORVASTATIN CALCIUM 20 MG PO TABS: 20.0000 mg | ORAL_TABLET | Freq: Every day | ORAL | 5 refills | Status: DC

## 2019-10-19 NOTE — Progress Notes (Signed)
   Subjective:  Audio only  Patient ID: Mary Vazquez, female    DOB: 1956-06-14, 63 y.o.   MRN: [id number] Average bp 120/72. Diabetes She presents for her follow-up diabetic visit. She has type 2 diabetes mellitus. She is compliant with treatment all of the time. Exercise: walks 5 days a week. Home blood sugar record trend: average 140. but 193 this morning. Eye exam is not current.   Neck pain on right side for a few months. Slight swelling and feels stiff sometimes. Pt wonders if it has something to do with her having an abscess tooth.   Virtual Visit via Telephone Note  I connected with Claudette Laws on 10/19/19 at  9:00 AM EST by telephone and verified that I am speaking with the correct person using two identifiers.  Location: Patient: home Provider: office   I discussed the limitations, risks, security and privacy concerns of performing an evaluation and management service by telephone and the availability of in person appointments. I also discussed with the patient that there may be a patient responsible charge related to this service. The patient expressed understanding and agreed to proceed.   History of Present Illness:    Observations/Objective:   Assessment and Plan:   Follow Up Instructions:    I discussed the assessment and treatment plan with the patient. The patient was provided an opportunity to ask questions and all were answered. The patient agreed with the plan and demonstrated an understanding of the instructions.   The patient was advised to call back or seek an in-person evaluation if the symptoms worsen or if the condition fails to improve as anticipated. 25 minutes of non-face-to-face time during this encounter.   Blood pressure medicine and blood pressure levels reviewed today with patient. Compliant with blood pressure medicine. States does not miss a dose. No obvious side effects. Blood pressure generally good when checked elsewhere.  Watching salt intake.   Patient continues to take lipid medication regularly. No obvious side effects from it. Generally does not miss a dose. Prior blood work results are reviewed with patient. Patient continues to work on fat intake in diet  Patient claims compliance with diabetes medication. No obvious side effects. Reports no substantial low sugar spells. Most numbers are generally in good range when checked fasting. Generally does not miss a dose of medication. Watching diabetic diet closely    Review of Systems No headache, no major weight loss or weight gain, no chest pain no back pain abdominal pain no change in bowel habits complete ROS otherwise negative     Objective:   Physical Exam   Virtual     Assessment & Plan:  Impression 1 hypertension.  Apparent good control discussed maintain same meds compliance discussed  2.  Hyperlipidemia.  Exact control uncertain.  Definitely time for blood work discussed.  Compliance discussed medications refilled and discussed  3.  Type 2 diabetes.  [id number] uncertain.  Glucose is decent but not perfect.  Discussed time for blood work compliance discussed medication refill  4.  Subacute neck strain.  Doubt relationship with abscess rationale discussed local measures and symptom care discussed  Further recommendations based on blood work results flu shot encouraged

## 2019-10-21 ENCOUNTER — Encounter: Payer: Self-pay | Admitting: Family Medicine

## 2019-10-23 DIAGNOSIS — Z13220 Encounter for screening for lipoid disorders: Secondary | ICD-10-CM | POA: Diagnosis not present

## 2019-10-23 DIAGNOSIS — E119 Type 2 diabetes mellitus without complications: Secondary | ICD-10-CM | POA: Diagnosis not present

## 2019-10-23 DIAGNOSIS — I10 Essential (primary) hypertension: Secondary | ICD-10-CM | POA: Diagnosis not present

## 2019-10-23 DIAGNOSIS — Z79899 Other long term (current) drug therapy: Secondary | ICD-10-CM | POA: Diagnosis not present

## 2019-10-24 LAB — LIPID PANEL
Chol/HDL Ratio: 1.8 ratio (ref 0.0–4.4)
Cholesterol, Total: 146 mg/dL (ref 100–199)
HDL: 80 mg/dL (ref >39)
LDL Chol Calc (NIH): 55 mg/dL (ref 0–99)
Triglycerides: 53 mg/dL (ref 0–149)
VLDL Cholesterol Cal: 11 mg/dL (ref 5–40)

## 2019-10-24 LAB — BASIC METABOLIC PANEL
BUN/Creatinine Ratio: 17 (ref 12–28)
BUN: 16 mg/dL (ref 8–27)
CO2: 22 mmol/L (ref 20–29)
Calcium: 9.6 mg/dL (ref 8.7–10.3)
Chloride: 105 mmol/L (ref 96–106)
Creatinine, Ser: 0.92 mg/dL (ref 0.57–1.00)
GFR calc Af Amer: 76 mL/min/{1.73_m2} (ref >59)
GFR calc non Af Amer: 66 mL/min/{1.73_m2} (ref >59)
Glucose: 144 mg/dL — ABNORMAL HIGH (ref 65–99)
Potassium: 4.3 mmol/L (ref 3.5–5.2)
Sodium: 143 mmol/L (ref 134–144)

## 2019-10-24 LAB — HEPATIC FUNCTION PANEL
ALT: 15 IU/L (ref 0–32)
AST: 18 IU/L (ref 0–40)
Albumin: 4.7 g/dL (ref 3.8–4.8)
Alkaline Phosphatase: 70 IU/L (ref 39–117)
Bilirubin Total: 0.4 mg/dL (ref 0.0–1.2)
Bilirubin, Direct: 0.1 mg/dL (ref 0.00–0.40)
Total Protein: 7.3 g/dL (ref 6.0–8.5)

## 2019-10-24 LAB — HEMOGLOBIN A1C
Est. average glucose Bld gHb Est-mCnc: 186 mg/dL
Hgb A1c MFr Bld: 8.1 % — ABNORMAL HIGH (ref 4.8–5.6)

## 2019-11-02 ENCOUNTER — Other Ambulatory Visit: Payer: Self-pay | Admitting: *Deleted

## 2019-11-02 MED ORDER — EMPAGLIFLOZIN 25 MG PO TABS: ORAL_TABLET | ORAL | 5 refills | Status: DC

## 2019-11-15 DIAGNOSIS — H524 Presbyopia: Secondary | ICD-10-CM | POA: Diagnosis not present

## 2019-11-15 DIAGNOSIS — E119 Type 2 diabetes mellitus without complications: Secondary | ICD-10-CM | POA: Diagnosis not present

## 2020-01-11 ENCOUNTER — Other Ambulatory Visit: Payer: Self-pay

## 2020-01-11 ENCOUNTER — Ambulatory Visit: Payer: BLUE CROSS/BLUE SHIELD | Attending: Internal Medicine

## 2020-01-11 DIAGNOSIS — Z20822 Contact with and (suspected) exposure to covid-19: Secondary | ICD-10-CM

## 2020-01-11 DIAGNOSIS — U071 COVID-19: Secondary | ICD-10-CM | POA: Insufficient documentation

## 2020-01-12 LAB — NOVEL CORONAVIRUS, NAA: SARS-CoV-2, NAA: DETECTED — AB

## 2020-01-13 ENCOUNTER — Telehealth: Payer: Self-pay | Admitting: Physician Assistant

## 2020-01-13 NOTE — Telephone Encounter (Signed)
Called to discuss with Claudette Laws about Covid symptoms and the use of bamlanivimab, a monoclonal antibody infusion for those with mild to moderate Covid symptoms and at a high risk of hospitalization.     Pt does not qualify for infusion therapy as she has asymptomatic infection. Isolation precautions discussed. Advised to contact back for consideration should she develop symptoms. Patient verbalized understanding. I provided her the hotline number to call if she develops symptoms.      Patient Active Problem List   Diagnosis Date Noted  . 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 Postmenopause bleeding 10/17/2014  . Postmenopausal bleeding x 0.5 d in Oct '15 10/09/2014  . Foot cramps 01/08/2014  . Proteinuria 03/04/2013  . Hypertension 03/02/2013  . Insomnia 03/02/2013  . Diabetes ([place]) 03/02/2013  . GERD (gastroesophageal reflux disease) 03/02/2013  . Adenomatous polyps 12/22/2012    Angelena Form PA-C  [facility]

## 2020-01-13 NOTE — Telephone Encounter (Signed)
Mary Vazquez to see

## 2020-01-18 ENCOUNTER — Encounter: Payer: Self-pay | Admitting: Family Medicine

## 2020-02-11 ENCOUNTER — Encounter: Payer: Self-pay | Admitting: Family Medicine

## 2020-02-12 MED ORDER — CLONAZEPAM 0.5 MG PO TABS: 0.5000 mg | ORAL_TABLET | Freq: Every evening | ORAL | 1 refills | Status: DC | PRN

## 2020-02-12 NOTE — Telephone Encounter (Signed)
Mikey Kirschner, MD     Klonopin 0.5 mg numb 30 one p o qhs prn for insomnia or anxiety, one ref

## 2020-02-20 ENCOUNTER — Ambulatory Visit (INDEPENDENT_AMBULATORY_CARE_PROVIDER_SITE_OTHER): Payer: Medicare Other | Admitting: Family Medicine

## 2020-02-20 ENCOUNTER — Encounter: Payer: Self-pay | Admitting: Family Medicine

## 2020-02-20 ENCOUNTER — Other Ambulatory Visit: Payer: Self-pay

## 2020-02-20 VITALS — BP 132/72 | Temp 97.5°F

## 2020-02-20 DIAGNOSIS — J310 Chronic rhinitis: Secondary | ICD-10-CM

## 2020-02-20 DIAGNOSIS — J329 Chronic sinusitis, unspecified: Secondary | ICD-10-CM | POA: Diagnosis not present

## 2020-02-20 MED ORDER — AZITHROMYCIN 250 MG PO TABS: ORAL_TABLET | ORAL | 0 refills | Status: DC

## 2020-02-20 MED ORDER — PREDNISONE 10 MG PO TABS: ORAL_TABLET | ORAL | 0 refills | Status: DC

## 2020-02-20 NOTE — Progress Notes (Signed)
   Subjective:  Audio only  Patient ID: Mary Vazquez, female    DOB: 1956-05-12, 64 y.o.   MRN: [id number]  Sinusitis This is a new problem. Episode onset: 2 -3 days. (Pressure in head) Treatments tried: alka seltzer cold plus.   Virtual Visit via Telephone Note  I connected with Mary Vazquez on 02/20/20 at  9:30 AM EST by telephone and verified that I am speaking with the correct person using two identifiers.  Location: Patient: home Provider: office   I discussed the limitations, risks, security and privacy concerns of performing an evaluation and management service by telephone and the availability of in person appointments. I also discussed with the patient that there may be a patient responsible charge related to this service. The patient expressed understanding and agreed to proceed.   History of Present Illness:    Observations/Objective:   Assessment and Plan:   Follow Up Instructions:    I discussed the assessment and treatment plan with the patient. The patient was provided an opportunity to ask questions and all were answered. The patient agreed with the plan and demonstrated an understanding of the instructions.   The patient was advised to call back or seek an in-person evaluation if the symptoms worsen or if the condition fails to improve as anticipated.  I provided 5minutes of non-face-to-face time during this encounter.  Mild pressure  Patient reports frontal congestion.  Element of tightness.  An element of stuffiness.  Developed after an exposure to the fumes.  Also slight since of pressure in chest.  But no true wheezing.  No cough no shortness of breath  Review of Systems See above    Objective:   Physical Exam  Virtual      Assessment & Plan:  Impression sinus/nasal inflammation secondary to exposure.  Will cover with antibiotics plus course of medium strength steroids.  Hold off on inhaler at this time

## 2020-02-20 NOTE — Telephone Encounter (Signed)
Pt contacted and states the she is having some pressure in head and chest. Has been going on for a couple of days. No cough, no fever, no trouble breathing. Pt transferred up front to set up virtual appt with provider

## 2020-02-23 ENCOUNTER — Encounter: Payer: Self-pay | Admitting: Family Medicine

## 2020-03-06 ENCOUNTER — Encounter: Payer: Self-pay | Admitting: Family Medicine

## 2020-03-06 NOTE — Telephone Encounter (Signed)
Old medication is not on our med list and advised [facility] to cancel all remaining refills on patient's profile.

## 2020-03-06 NOTE — Telephone Encounter (Addendum)
Patient states she went to the pharmacy to pick up [place] but when she got home and looked at it it was invokanna 100mg  2 tabs by mouth before breakfast. Patient was not aware of change in med. Spoke with [facility]ist at [facility] [place] who stated the Invonkanna filled was from an old script over a year old and if she brings the medicine back to pharmacy they will give her the correct script for Jardiance.

## 2020-03-30 ENCOUNTER — Encounter: Payer: Self-pay | Admitting: Family Medicine

## 2020-04-03 ENCOUNTER — Ambulatory Visit (HOSPITAL_COMMUNITY)
Admission: RE | Admit: 2020-04-03 | Discharge: 2020-04-03 | Disposition: A | Discharge status: Home / Self Care | Payer: Medicare Other | Source: Ambulatory Visit | Attending: Family Medicine | Admitting: Family Medicine

## 2020-04-03 ENCOUNTER — Encounter: Payer: Self-pay | Admitting: Family Medicine

## 2020-04-03 ENCOUNTER — Other Ambulatory Visit: Payer: Self-pay

## 2020-04-03 ENCOUNTER — Ambulatory Visit: Payer: Medicare Other | Admitting: Family Medicine

## 2020-04-03 VITALS — BP 168/100 | HR 69 | Temp 98.3°F | Ht 67.0 in | Wt 151.0 lb

## 2020-04-03 DIAGNOSIS — Z13220 Encounter for screening for lipoid disorders: Secondary | ICD-10-CM

## 2020-04-03 DIAGNOSIS — Z79899 Other long term (current) drug therapy: Secondary | ICD-10-CM

## 2020-04-03 DIAGNOSIS — E119 Type 2 diabetes mellitus without complications: Secondary | ICD-10-CM

## 2020-04-03 DIAGNOSIS — E785 Hyperlipidemia, unspecified: Secondary | ICD-10-CM | POA: Diagnosis not present

## 2020-04-03 DIAGNOSIS — R0789 Other chest pain: Secondary | ICD-10-CM

## 2020-04-03 DIAGNOSIS — R0600 Dyspnea, unspecified: Secondary | ICD-10-CM

## 2020-04-03 LAB — POCT GLYCOSYLATED HEMOGLOBIN (HGB A1C): Hemoglobin A1C: 5.8 % — AB (ref 4.0–5.6)

## 2020-04-03 MED ORDER — ATORVASTATIN CALCIUM 20 MG PO TABS: 20.0000 mg | ORAL_TABLET | Freq: Every day | ORAL | 1 refills | Status: DC

## 2020-04-03 MED ORDER — EMPAGLIFLOZIN 25 MG PO TABS: ORAL_TABLET | ORAL | 1 refills | Status: DC

## 2020-04-03 MED ORDER — CLONAZEPAM 0.5 MG PO TABS: 0.5000 mg | ORAL_TABLET | Freq: Every evening | ORAL | 5 refills | Status: DC | PRN

## 2020-04-03 MED ORDER — METFORMIN HCL 500 MG PO TABS: ORAL_TABLET | ORAL | 1 refills | Status: DC

## 2020-04-03 MED ORDER — GLIPIZIDE 5 MG PO TABS: ORAL_TABLET | ORAL | 1 refills | Status: DC

## 2020-04-03 MED ORDER — ENALAPRIL MALEATE 20 MG PO TABS: 20.0000 mg | ORAL_TABLET | Freq: Every day | ORAL | 1 refills | Status: DC

## 2020-04-03 NOTE — Progress Notes (Signed)
w

## 2020-04-03 NOTE — Progress Notes (Signed)
   Subjective:    Patient ID: Mary Vazquez, female    DOB: 08-22-1956, 64 y.o.   MRN: [id number]  Chest Pain  This is a new problem. Episode onset: since before march 9th. Treatments tried: prednisone, zithromax.  walks 5 days a week and since march has had some sob when walking.   Blood sugar has been low. Had a few around 44, 45 , 58. Pt stopped taking mid day metformin and since sugar has been better. Average now is 109.   Results for orders placed or performed in visit on 04/03/20  POCT glycosylated hemoglobin (Hb A1C)  Result Value Ref Range   Hemoglobin A1C 5.8 (A) 4.0 - 5.6 %   HbA1c POC (<> result, manual entry)     HbA1c, POC (prediabetic range)     HbA1c, POC (controlled diabetic range)     Low numbers are running at times  Pt now exercising a lot   And now also working on good diet  Patient notes only no shortness of breath with exertion.  States this is been going on pretty much since she had Covid.  No wheezing.  No cough.  No shortness of breath at rest.  Situation is complicated by recent death of spouse and understandable grieving in this regard   Review of Systems  Cardiovascular: Positive for chest pain.       Objective:   Physical Exam  Alert and oriented, vitals reviewed and stable, NAD ENT-TM's and ext canals WNL bilat via otoscopic exam Soft palate, tonsils and post pharynx WNL via oropharyngeal exam Neck-symmetric, no masses; thyroid nonpalpable and nontender Pulmonary-no tachypnea or accessory muscle use; Clear without wheezes via auscultation Card--no abnrml murmurs, rhythm reg and rate WNL Carotid pulses symmetric, without bruits   EKG normal sinus rhythm.  No significant ST-T changes    Assessment & Plan:  Impression intermittent dyspnea with exertion.  No pain component.  Normal EKG normal cardiac exam highly doubt silent ischemia.  His symptoms have pretty much kicked in since the Covid infection.  Patient states was exercising  before then.  We will do chest x-ray.  If negative would give it a bit more time before full work-up  2.  Type 2 diabetes control on the overly tight side.  Decrease glipizide to 5 twice daily  3.  Hypertension good control discussed  4.  Hyperlipidemia status uncertain  Chest x-ray/decrease glipizide to 5 twice daily/appropriate/maintain same medications/expect ongoing improvement with dyspnea  We will check in 6 months

## 2020-04-04 ENCOUNTER — Encounter: Payer: Self-pay | Admitting: Family Medicine

## 2020-04-06 ENCOUNTER — Encounter: Payer: Self-pay | Admitting: Family Medicine

## 2020-04-11 LAB — LIPID PANEL
Chol/HDL Ratio: 2.1 ratio (ref 0.0–4.4)
Cholesterol, Total: 173 mg/dL (ref 100–199)
HDL: 84 mg/dL (ref >39)
LDL Chol Calc (NIH): 78 mg/dL (ref 0–99)
Triglycerides: 56 mg/dL (ref 0–149)
VLDL Cholesterol Cal: 11 mg/dL (ref 5–40)

## 2020-04-11 LAB — HEPATIC FUNCTION PANEL
ALT: 22 IU/L (ref 0–32)
AST: 32 IU/L (ref 0–40)
Albumin: 4.7 g/dL (ref 3.8–4.8)
Alkaline Phosphatase: 67 IU/L (ref 39–117)
Bilirubin Total: 0.3 mg/dL (ref 0.0–1.2)
Bilirubin, Direct: 0.12 mg/dL (ref 0.00–0.40)
Total Protein: 6.7 g/dL (ref 6.0–8.5)

## 2020-04-12 ENCOUNTER — Encounter: Payer: Self-pay | Admitting: Family Medicine

## 2020-06-07 ENCOUNTER — Other Ambulatory Visit (HOSPITAL_COMMUNITY): Payer: Self-pay | Admitting: Family Medicine

## 2020-06-07 DIAGNOSIS — Z1231 Encounter for screening mammogram for malignant neoplasm of breast: Secondary | ICD-10-CM

## 2020-06-13 ENCOUNTER — Other Ambulatory Visit: Payer: Self-pay

## 2020-06-13 ENCOUNTER — Ambulatory Visit (HOSPITAL_COMMUNITY)
Admission: RE | Admit: 2020-06-13 | Discharge: 2020-06-13 | Disposition: A | Discharge status: Home / Self Care | Payer: Medicare Other | Source: Ambulatory Visit | Attending: Family Medicine | Admitting: Family Medicine

## 2020-06-13 DIAGNOSIS — Z1231 Encounter for screening mammogram for malignant neoplasm of breast: Secondary | ICD-10-CM | POA: Diagnosis not present

## 2020-09-03 ENCOUNTER — Ambulatory Visit: Payer: Medicare Other | Admitting: Adult Health

## 2020-09-03 ENCOUNTER — Encounter: Payer: Self-pay | Admitting: Adult Health

## 2020-09-03 VITALS — BP 159/88 | HR 83 | Ht 66.0 in | Wt 154.0 lb

## 2020-09-03 DIAGNOSIS — R81 Glycosuria: Secondary | ICD-10-CM

## 2020-09-03 DIAGNOSIS — R309 Painful micturition, unspecified: Secondary | ICD-10-CM | POA: Diagnosis not present

## 2020-09-03 DIAGNOSIS — R350 Frequency of micturition: Secondary | ICD-10-CM

## 2020-09-03 LAB — POCT URINALYSIS DIPSTICK
Blood, UA: NEGATIVE
Glucose, UA: POSITIVE — AB
Ketones, UA: NEGATIVE
Leukocytes, UA: NEGATIVE
Nitrite, UA: NEGATIVE
Protein, UA: NEGATIVE

## 2020-09-03 LAB — GLUCOSE, POCT (MANUAL RESULT ENTRY): POC Glucose: 206 mg/dl — AB (ref 70–99)

## 2020-09-03 NOTE — Progress Notes (Signed)
  Subjective:     Patient ID: Mary Vazquez, female   DOB: [birth year], 64 y.o.   MRN: [id number]  HPI Mary Vazquez is a 64 year old black female, widowed, PM in complaining of urinary frequency and feels "different" in vaginal area. She took AZO last week.  PCP Dr Wolfgang Phoenix.   Review of Systems Urinary frequency  Reviewed past medical,surgical, social and family history. Reviewed medications and allergies.     Objective:   Physical Exam BP (!) 159/88 (BP Location: Right Arm, Patient Position: Sitting, Cuff Size: Normal)   Pulse 83   Ht 5\' 6"  (1.676 m)   Wt 154 lb (69.9 kg)   BMI 24.86 kg/m urine dipstick 4+ glucose and finger stick blood sugar is 206 after eating sleeve of Ritz crackers.    Skin warm and dry.Pelvic: external genitalia is normal in appearance no lesions, vagina: pink, no discharge,urethra has no lesions or masses noted, cervix:smooth and bulbous, uterus: normal size, shape and contour, non tender, no masses felt, adnexa: no masses or tenderness noted. Bladder is non tender and no masses felt.  Fall risk is low  Upstream - 09/03/20 1543      Pregnancy Intention Screening   Does the patient want to become pregnant in the next year? N/A    Does the patient's partner want to become pregnant in the next year? N/A    Would the patient like to discuss contraceptive options today? N/A      Contraception Wrap Up   Current Method Female Sterilization    End Method Female Sterilization    Contraception Counseling Provided No         Examination chaperoned by Levy Pupa LPN.  Assessment:     1. Pain with urination   2. Urinary frequency UA C&S sent decrease caffeine  3. Glucosuria Decrease carbs  Increase water     Plan:     Return in 4 weeks for pap and physical

## 2020-09-05 LAB — MICROSCOPIC EXAMINATION
Bacteria, UA: NONE SEEN
Casts: NONE SEEN /lpf
RBC, Urine: NONE SEEN /hpf (ref 0–2)

## 2020-09-05 LAB — URINALYSIS, ROUTINE W REFLEX MICROSCOPIC
Bilirubin, UA: NEGATIVE
Ketones, UA: NEGATIVE
Nitrite, UA: NEGATIVE
Protein,UA: NEGATIVE
RBC, UA: NEGATIVE
Specific Gravity, UA: 1.027 (ref 1.005–1.030)
Urobilinogen, Ur: 0.2 mg/dL (ref 0.2–1.0)
pH, UA: 5.5 (ref 5.0–7.5)

## 2020-09-06 LAB — URINE CULTURE

## 2020-10-02 ENCOUNTER — Encounter: Payer: Self-pay | Admitting: Adult Health

## 2020-10-02 ENCOUNTER — Other Ambulatory Visit (HOSPITAL_COMMUNITY)
Admission: RE | Admit: 2020-10-02 | Discharge: 2020-10-02 | Disposition: A | Discharge status: Home / Self Care | Payer: Medicare Other | Source: Ambulatory Visit | Attending: Adult Health | Admitting: Adult Health

## 2020-10-02 ENCOUNTER — Ambulatory Visit (INDEPENDENT_AMBULATORY_CARE_PROVIDER_SITE_OTHER): Payer: Medicare Other | Admitting: Adult Health

## 2020-10-02 VITALS — BP 139/85 | HR 89 | Ht 66.5 in | Wt 149.5 lb

## 2020-10-02 DIAGNOSIS — K429 Umbilical hernia without obstruction or gangrene: Secondary | ICD-10-CM | POA: Diagnosis not present

## 2020-10-02 DIAGNOSIS — Z01419 Encounter for gynecological examination (general) (routine) without abnormal findings: Secondary | ICD-10-CM | POA: Insufficient documentation

## 2020-10-02 DIAGNOSIS — Z124 Encounter for screening for malignant neoplasm of cervix: Secondary | ICD-10-CM | POA: Insufficient documentation

## 2020-10-02 DIAGNOSIS — Z1151 Encounter for screening for human papillomavirus (HPV): Secondary | ICD-10-CM | POA: Diagnosis not present

## 2020-10-02 DIAGNOSIS — Z1211 Encounter for screening for malignant neoplasm of colon: Secondary | ICD-10-CM | POA: Insufficient documentation

## 2020-10-02 LAB — HEMOCCULT GUIAC POC 1CARD (OFFICE): Fecal Occult Blood, POC: NEGATIVE

## 2020-10-02 NOTE — Progress Notes (Signed)
Patient ID: ANAE Mary Vazquez, female   DOB: 26-Jan-1956, 63 y.o.   MRN: [id number] History of Present Illness: Mary Vazquez is a 64 year old black female,widowed,PM in for a well woman gyn exam and pap. PCP is Dr Lovena Le   Current Medications, Allergies, Past Medical History, Past Surgical History, Family History and Social History were reviewed in [facility] record.     Review of Systems: Patient denies any headaches, hearing loss, fatigue, blurred vision, shortness of breath, chest pain, abdominal pain, problems with bowel movements, urination, or intercourse(not active). No joint pain or mood swings. Has cough ,wonders if related to BP meds, will talk with Dr Lovena Le   Physical Exam:BP 139/85 (BP Location: Left Arm, Patient Position: Sitting, Cuff Size: Normal)   Pulse 89   Ht 5' 6.5" (1.689 m)   Wt 149 lb 8 oz (67.8 kg)   BMI 23.77 kg/m  General:  Well developed, well nourished, no acute distress Skin:  Warm and dry Neck:  Midline trachea, normal thyroid, good ROM, no lymphadenopathy,no carotid bruits heard  Lungs; Clear to auscultation bilaterally Breast:  No dominant palpable mass, retraction, or nipple discharge Cardiovascular: Regular rate and rhythm Abdomen:  Soft, non tender, no hepatosplenomegaly,has small non tender umbilical hernia  Pelvic:  External genitalia is normal in appearance, no lesions.  The vagina is normal in appearance, has mild cystocele. Urethra has no lesions or masses. The cervix is smooth, pap with high risk HPV genotyping performed.  Uterus is felt to be normal size, shape, and contour.  No adnexal masses or tenderness noted.Bladder is non tender, no masses felt. Rectal: Good sphincter tone, no polyps, or hemorrhoids felt.  Hemoccult negative. Extremities/musculoskeletal:  No swelling or varicosities noted, no clubbing or cyanosis Psych:  No mood changes, alert and cooperative,seems happy AA is 1 Fall risk is low PHQ 9 score is 0   Upstream - 10/02/20 1602      Pregnancy Intention Screening   Does the patient want to become pregnant in the next year? N/A    Does the patient's partner want to become pregnant in the next year? N/A    Would the patient like to discuss contraceptive options today? N/A      Contraception Wrap Up   Current Method Female Sterilization    End Method Female Sterilization    Contraception Counseling Provided No         Examination chaperoned by Levy Pupa LPN  Impression and Plan: 1. Routine cervical smear Pap sent   2. Encounter for gynecological examination with Papanicolaou smear of cervix Pap sent Physical with PCP next year Pap in 3 years if normal  Labs with PCP  Colonoscopy per GI  3. Encounter for screening fecal occult blood testin  4. Umbilical hernia without obstruction and without gangrene

## 2020-10-04 LAB — CYTOLOGY - PAP
Comment: NEGATIVE
Diagnosis: NEGATIVE
High risk HPV: NEGATIVE

## 2020-10-08 NOTE — Telephone Encounter (Signed)
Pain started 4 days ago. No fever. Pain only when having a bowel movement. Having 3 -4 episodes of diarrhea a day. No blood or mucus in stool. Mostly concerned because of hemorroids bothering her with the diarrhea. pt wanted to wait til tomorrow to be seen. Did not feel she needed to go to urgent care today. Advised pt that tomorrow's visit would focus on acute issue and she could schedule another visit for a med check up when she comes in tomorrow.

## 2020-10-09 ENCOUNTER — Ambulatory Visit: Payer: Medicare Other | Admitting: Family Medicine

## 2020-10-09 ENCOUNTER — Encounter: Payer: Self-pay | Admitting: Family Medicine

## 2020-10-09 ENCOUNTER — Other Ambulatory Visit: Payer: Self-pay

## 2020-10-09 VITALS — BP 124/80 | Temp 97.7°F | Ht 66.5 in | Wt 147.0 lb

## 2020-10-09 DIAGNOSIS — E119 Type 2 diabetes mellitus without complications: Secondary | ICD-10-CM

## 2020-10-09 DIAGNOSIS — E1169 Type 2 diabetes mellitus with other specified complication: Secondary | ICD-10-CM

## 2020-10-09 DIAGNOSIS — R197 Diarrhea, unspecified: Secondary | ICD-10-CM | POA: Diagnosis not present

## 2020-10-09 DIAGNOSIS — E785 Hyperlipidemia, unspecified: Secondary | ICD-10-CM

## 2020-10-09 DIAGNOSIS — I10 Essential (primary) hypertension: Secondary | ICD-10-CM | POA: Diagnosis not present

## 2020-10-09 DIAGNOSIS — K649 Unspecified hemorrhoids: Secondary | ICD-10-CM | POA: Diagnosis not present

## 2020-10-09 DIAGNOSIS — Z23 Encounter for immunization: Secondary | ICD-10-CM

## 2020-10-09 LAB — POCT GLYCOSYLATED HEMOGLOBIN (HGB A1C): Hemoglobin A1C: 6.3 % — AB (ref 4.0–5.6)

## 2020-10-09 MED ORDER — EMPAGLIFLOZIN 25 MG PO TABS: ORAL_TABLET | ORAL | 1 refills | Status: DC

## 2020-10-09 MED ORDER — GLIPIZIDE 5 MG PO TABS: ORAL_TABLET | ORAL | 1 refills | Status: DC

## 2020-10-09 MED ORDER — ATORVASTATIN CALCIUM 20 MG PO TABS
20.0000 mg | ORAL_TABLET | Freq: Every day | ORAL | 1 refills | Status: DC
Start: 2020-10-09 — End: 2021-04-14

## 2020-10-09 MED ORDER — ENALAPRIL MALEATE 20 MG PO TABS: 20.0000 mg | ORAL_TABLET | Freq: Every day | ORAL | 1 refills | Status: DC

## 2020-10-09 MED ORDER — METFORMIN HCL 500 MG PO TABS: ORAL_TABLET | ORAL | 1 refills | Status: DC

## 2020-10-09 NOTE — Progress Notes (Signed)
Patient ID: Mary Vazquez, female    DOB: 10/21/1956, 64 y.o.   MRN: [id number]   Chief Complaint  Patient presents with  . Establish Care  . Hypertension  . Diarrhea   Subjective:    HPI  pt arrives to get established and concern of diarrhea and htn. Concerns about diarrhea for 4 days. Has 2 -3 times a day. Treatments tried increasing fluids. Didn't take any medicaitons for the diarrhea.  Had 1 episode diarrhea this am,  Yesterday- improved in afternoon, 2x diarrhea.  Never had diarrhea in past. No sick contacts.  No fever or abd pain. Cramping in abd when needing to have bm.  No blood today. Thought hemorrhoid was flaring up, but got better. Not wanting it examined today.  Has had colonoscopy and was told she has some internal hemorrhoids.   Small blood after multiple times of diarrhea.   Cough for 1 -2 weeks. Pt thinks it is coming from her bp med. Pt taking enalapril 29m and has been on this since 2013.  Mild coughing, dry, non productive.  No runny nose, sore throat, fever, ear pain, or other URI symptoms. 2 wks with dry coughing. Not having all day long coughing, mostly at night with laying down.   Results for orders placed or performed in visit on 10/09/20  CBC  Result Value Ref Range   WBC 4.3 3.4 - 10.8 x10E3/uL   RBC 4.81 3.77 - 5.28 x10E6/uL   Hemoglobin 14.4 11.1 - 15.9 g/dL   Hematocrit 43.6 34.0 - 46.6 %   MCV 91 79 - 97 fL   MCH 29.9 26.6 - 33.0 pg   MCHC 33.0 31 - 35 g/dL   RDW 13.4 11.7 - 15.4 %   Platelets 249 150 - 450 x10E3/uL  CMP14+EGFR  Result Value Ref Range   Glucose 121 (H) 65 - 99 mg/dL   BUN 15 8 - 27 mg/dL   Creatinine, Ser 0.88 0.57 - 1.00 mg/dL   GFR calc non Af Amer 69 >59 mL/min/1.73   GFR calc Af Amer 79 >59 mL/min/1.73   BUN/Creatinine Ratio 17 12 - 28   Sodium 144 134 - 144 mmol/L   Potassium 4.5 3.5 - 5.2 mmol/L   Chloride 108 (H) 96 - 106 mmol/L   CO2 23 20 - 29 mmol/L   Calcium 9.4 8.7 - 10.3 mg/dL   Total  Protein 6.6 6.0 - 8.5 g/dL   Albumin 4.3 3.8 - 4.8 g/dL   Globulin, Total 2.3 1.5 - 4.5 g/dL   Albumin/Globulin Ratio 1.9 1.2 - 2.2   Bilirubin Total 0.3 0.0 - 1.2 mg/dL   Alkaline Phosphatase 68 44 - 121 IU/L   AST 19 0 - 40 IU/L   ALT 17 0 - 32 IU/L  Hemoglobin A1c  Result Value Ref Range   Hgb A1c MFr Bld 8.2 (H) 4.8 - 5.6 %   Est. average glucose Bld gHb Est-mCnc 189 mg/dL  Lipid panel  Result Value Ref Range   Cholesterol, Total 139 100 - 199 mg/dL   Triglycerides 51 0 - 149 mg/dL   HDL 70 >39 mg/dL   VLDL Cholesterol Cal 11 5 - 40 mg/dL   LDL Chol Calc (NIH) 58 0 - 99 mg/dL   Chol/HDL Ratio 2.0 0.0 - 4.4 ratio  Microalbumin, urine  Result Value Ref Range   Microalbumin, Urine <3.0 Not Estab. ug/mL  POCT glycosylated hemoglobin (Hb A1C)  Result Value Ref Range   Hemoglobin A1C 6.3 (  A) 4.0 - 5.6 %   HbA1c POC (<> result, manual entry)     HbA1c, POC (prediabetic range)     HbA1c, POC (controlled diabetic range)       Medical History Mary Vazquez has a past medical history of Diabetes ([place]), Hypertension, and PONV (postoperative nausea and vomiting).   Outpatient Encounter Medications as of 10/09/2020  Medication Sig  . atorvastatin (LIPITOR) 20 MG tablet Take 1 tablet (20 mg total) by mouth daily.  . empagliflozin (JARDIANCE) 25 MG TABS tablet Take one tablet every morning  . enalapril (VASOTEC) 20 MG tablet Take 1 tablet (20 mg total) by mouth daily.  Marland Kitchen glipiZIDE (GLUCOTROL) 5 MG tablet Take one tablet bid  . metFORMIN (GLUCOPHAGE) 500 MG tablet Take 2 tablets in the morning and 2 tablets in the evening  . Propylene Glycol 0.6 % SOLN Apply 1 drop to eye daily.  . [DISCONTINUED] atorvastatin (LIPITOR) 20 MG tablet Take 1 tablet (20 mg total) by mouth daily.  . [DISCONTINUED] empagliflozin (JARDIANCE) 25 MG TABS tablet Take one tablet every morning  . [DISCONTINUED] enalapril (VASOTEC) 20 MG tablet Take 1 tablet (20 mg total) by mouth daily.  . [DISCONTINUED] glipiZIDE  (GLUCOTROL) 5 MG tablet Take one tablet bid  . [DISCONTINUED] metFORMIN (GLUCOPHAGE) 500 MG tablet Take 2 tablets in the morning and 2 tablets in the evening  . [DISCONTINUED] clonazePAM (KLONOPIN) 0.5 MG tablet Take 1 tablet (0.5 mg total) by mouth at bedtime as needed for anxiety (insomnia). (Patient not taking: Reported on 09/03/2020)  . [DISCONTINUED] ketoconazole (NIZORAL) 2 % cream Apply 1 application topically 2 (two) times daily. (Patient not taking: Reported on 09/03/2020)   No facility-administered encounter medications on file as of 10/09/2020.     Review of Systems  Constitutional: Negative for chills and fever.  HENT: Negative for congestion, rhinorrhea and sore throat.   Respiratory: Positive for cough (dry coughing). Negative for shortness of breath and wheezing.   Cardiovascular: Negative for chest pain and leg swelling.  Gastrointestinal: Positive for blood in stool (small amt h/o hemorrhoids) and diarrhea (improving). Negative for abdominal pain, nausea and vomiting.  Genitourinary: Negative for dysuria and frequency.  Musculoskeletal: Negative for arthralgias and back pain.  Skin: Negative for rash.  Neurological: Negative for dizziness, weakness and headaches.     Vitals BP 124/80   Temp 97.7 F (36.5 C)   Ht 5' 6.5" (1.689 m)   Wt 147 lb (66.7 kg)   BMI 23.37 kg/m   Objective:   Physical Exam Vitals and nursing note reviewed.  Constitutional:      General: She is not in acute distress.    Appearance: Normal appearance. She is not ill-appearing.  HENT:     Head: Normocephalic and atraumatic.     Nose: Nose normal.     Mouth/Throat:     Mouth: Mucous membranes are moist.     Pharynx: Oropharynx is clear.  Eyes:     Extraocular Movements: Extraocular movements intact.     Conjunctiva/sclera: Conjunctivae normal.     Pupils: Pupils are equal, round, and reactive to light.  Cardiovascular:     Rate and Rhythm: Normal rate and regular rhythm.     Pulses:  Normal pulses.     Heart sounds: Normal heart sounds.  Pulmonary:     Effort: Pulmonary effort is normal.     Breath sounds: Normal breath sounds. No wheezing, rhonchi or rales.  Abdominal:     General: Abdomen is flat. Bowel sounds  are normal. There is no distension.     Palpations: Abdomen is soft. There is no mass.     Tenderness: There is no abdominal tenderness. There is no guarding or rebound.     Hernia: No hernia is present.  Musculoskeletal:        General: Normal range of motion.     Right lower leg: No edema.     Left lower leg: No edema.  Skin:    General: Skin is warm and dry.     Findings: No lesion or rash.  Neurological:     General: No focal deficit present.     Mental Status: She is alert and oriented to person, place, and time.     Cranial Nerves: No cranial nerve deficit.  Psychiatric:        Mood and Affect: Mood normal.        Behavior: Behavior normal.      Assessment and Plan   1. Diabetes mellitus without complication ([place]) - POCT glycosylated hemoglobin (Hb A1C) - CBC - CMP14+EGFR - Hemoglobin A1c - Lipid panel - Microalbumin, urine - empagliflozin (JARDIANCE) 25 MG TABS tablet; Take one tablet every morning  Dispense: 90 tablet; Refill: 1 - metFORMIN (GLUCOPHAGE) 500 MG tablet; Take 2 tablets in the morning and 2 tablets in the evening  Dispense: 360 tablet; Refill: 1 - glipiZIDE (GLUCOTROL) 5 MG tablet; Take one tablet bid  Dispense: 180 tablet; Refill: 1  2. Need for vaccination - Flu Vaccine QUAD High Dose(Fluad)  3. Hemorrhoids, unspecified hemorrhoid type  4. Hypertension, unspecified type - enalapril (VASOTEC) 20 MG tablet; Take 1 tablet (20 mg total) by mouth daily.  Dispense: 90 tablet; Refill: 1  5. Diarrhea, unspecified type  6. Hyperlipidemia associated with type 2 diabetes mellitus (HCC)   Dry coughing-  Pt to call back in 2 wks if still having coughing and allergy meds not helping.   May need to switch from enalapril to  losartan. May have allergies with the season changes.  Pt to take claritin or zyrtec.  Diarrhea- resolved. H/o hemorrhoids- intermittent blood with diarrhea a few times this week.  advising to use preparation H or suppositories otc if needed.  If see more blood to call or rto  htn- stable.  Cont meds.  dm2- stable. Ordered labs.  Last a1c in office 6.3.  Will recheck on send out labs.  hld- stable, controlled.  Labs normal.  Cont meds, lipitor.   f/u 3-7mofor recheck.

## 2020-10-09 NOTE — Telephone Encounter (Signed)
Pt has not tried any treatments. She came in today for visit.

## 2020-10-19 LAB — LIPID PANEL
Chol/HDL Ratio: 2 ratio (ref 0.0–4.4)
Cholesterol, Total: 139 mg/dL (ref 100–199)
HDL: 70 mg/dL (ref >39)
LDL Chol Calc (NIH): 58 mg/dL (ref 0–99)
Triglycerides: 51 mg/dL (ref 0–149)
VLDL Cholesterol Cal: 11 mg/dL (ref 5–40)

## 2020-10-19 LAB — CMP14+EGFR
ALT: 17 IU/L (ref 0–32)
AST: 19 IU/L (ref 0–40)
Albumin/Globulin Ratio: 1.9 (ref 1.2–2.2)
Albumin: 4.3 g/dL (ref 3.8–4.8)
Alkaline Phosphatase: 68 IU/L (ref 44–121)
BUN/Creatinine Ratio: 17 (ref 12–28)
BUN: 15 mg/dL (ref 8–27)
Bilirubin Total: 0.3 mg/dL (ref 0.0–1.2)
CO2: 23 mmol/L (ref 20–29)
Calcium: 9.4 mg/dL (ref 8.7–10.3)
Chloride: 108 mmol/L — ABNORMAL HIGH (ref 96–106)
Creatinine, Ser: 0.88 mg/dL (ref 0.57–1.00)
GFR calc Af Amer: 79 mL/min/{1.73_m2} (ref >59)
GFR calc non Af Amer: 69 mL/min/{1.73_m2} (ref >59)
Globulin, Total: 2.3 g/dL (ref 1.5–4.5)
Glucose: 121 mg/dL — ABNORMAL HIGH (ref 65–99)
Potassium: 4.5 mmol/L (ref 3.5–5.2)
Sodium: 144 mmol/L (ref 134–144)
Total Protein: 6.6 g/dL (ref 6.0–8.5)

## 2020-10-19 LAB — HEMOGLOBIN A1C
Est. average glucose Bld gHb Est-mCnc: 189 mg/dL
Hgb A1c MFr Bld: 8.2 % — ABNORMAL HIGH (ref 4.8–5.6)

## 2020-10-19 LAB — CBC
Hematocrit: 43.6 % (ref 34.0–46.6)
Hemoglobin: 14.4 g/dL (ref 11.1–15.9)
MCH: 29.9 pg (ref 26.6–33.0)
MCHC: 33 g/dL (ref 31.5–35.7)
MCV: 91 fL (ref 79–97)
Platelets: 249 10*3/uL (ref 150–450)
RBC: 4.81 x10E6/uL (ref 3.77–5.28)
RDW: 13.4 % (ref 11.7–15.4)
WBC: 4.3 10*3/uL (ref 3.4–10.8)

## 2020-10-19 LAB — MICROALBUMIN, URINE: Microalbumin, Urine: <3 ug/mL

## 2020-10-20 ENCOUNTER — Encounter: Payer: Self-pay | Admitting: Family Medicine

## 2020-10-24 ENCOUNTER — Other Ambulatory Visit: Payer: Self-pay | Admitting: *Deleted

## 2020-10-24 DIAGNOSIS — I10 Essential (primary) hypertension: Secondary | ICD-10-CM

## 2020-10-24 DIAGNOSIS — E119 Type 2 diabetes mellitus without complications: Secondary | ICD-10-CM

## 2020-11-15 ENCOUNTER — Encounter: Payer: Self-pay | Admitting: Adult Health

## 2020-11-15 ENCOUNTER — Other Ambulatory Visit: Payer: Self-pay

## 2020-11-15 ENCOUNTER — Ambulatory Visit: Payer: Medicare Other | Admitting: Adult Health

## 2020-11-15 VITALS — BP 147/83 | HR 74 | Ht 66.5 in | Wt 148.5 lb

## 2020-11-15 DIAGNOSIS — R319 Hematuria, unspecified: Secondary | ICD-10-CM | POA: Diagnosis not present

## 2020-11-15 DIAGNOSIS — N390 Urinary tract infection, site not specified: Secondary | ICD-10-CM | POA: Diagnosis not present

## 2020-11-15 DIAGNOSIS — R309 Painful micturition, unspecified: Secondary | ICD-10-CM

## 2020-11-15 LAB — POCT URINALYSIS DIPSTICK
Glucose, UA: POSITIVE — AB
Ketones, UA: NEGATIVE
Nitrite, UA: NEGATIVE
Protein, UA: POSITIVE — AB

## 2020-11-15 MED ORDER — NITROFURANTOIN MONOHYD MACRO 100 MG PO CAPS: 100.0000 mg | ORAL_CAPSULE | Freq: Two times a day (BID) | ORAL | 0 refills | Status: DC

## 2020-11-15 MED ORDER — PHENAZOPYRIDINE HCL 200 MG PO TABS: 200.0000 mg | ORAL_TABLET | Freq: Three times a day (TID) | ORAL | 0 refills | Status: DC | PRN

## 2020-11-15 NOTE — Progress Notes (Signed)
  Subjective:     Patient ID: Mary Vazquez, female   DOB: [birth year], 64 y.o.   MRN: [id number]  HPI Mary Vazquez is a 64 year old black female, widowed, PM worked in for pain with urination for last 2 days and has seen blood. PCP is Dr Lovena Le  Review of Systems For last 2 days has pain with urination and has seen blood, even on pad Reviewed past medical,surgical, social and family history. Reviewed medications and allergies.     Objective:   Physical Exam BP (!) 147/83 (BP Location: Left Arm, Patient Position: Sitting, Cuff Size: Normal)   Pulse 74   Ht 5' 6.5" (1.689 m)   Wt 148 lb 8 oz (67.4 kg)   BMI 23.61 kg/m Urine trace leuks,trace protein, 3+blood, and 4+ glucose Skin warm and dry.Pelvic: external genitalia is normal in appearance no lesions, vagina: pale pink, no blood,urethra has no lesions or masses noted, cervix:smooth, uterus: normal size, shape and contour, non tender, no masses felt, adnexa: no masses or tenderness noted. Bladder is non tender and no masses felt. No CVAT Examination chaperoned by Levy Pupa LPN  [facility] - 02/58/52 1228      Pregnancy Intention Screening   Does the patient want to become pregnant in the next year? N/A    Does the patient's partner want to become pregnant in the next year? N/A    Would the patient like to discuss contraceptive options today? N/A      Contraception Wrap Up   Current Method No Method - Other Reason   postmenopausal   End Method No Method - Other Reason   postmenopausal   Contraception Counseling Provided No             Assessment:     1. Pain with urination UA C&S sent   2. Hematuria, unspecified type UA C&S sent  3. Urinary tract infection with hematuria, site unspecified Will rx Macrobid and pyridium Meds ordered this encounter  Medications  . nitrofurantoin, macrocrystal-monohydrate, (MACROBID) 100 MG capsule    Sig: Take 1 capsule (100 mg total) by mouth 2 (two) times daily.    Dispense:  14  capsule    Refill:  0    Order Specific Question:   Supervising Provider    Answer:   Elonda Husky, LUTHER H [[id number]]  . phenazopyridine (PYRIDIUM) 200 MG tablet    Sig: Take 1 tablet (200 mg total) by mouth 3 (three) times daily as needed for pain.    Dispense:  10 tablet    Refill:  0    Order Specific Question:   Supervising Provider    Answer:   Florian Buff [[id number]]      Plan:    If pain in back or fever over 100.66may want to go to Urgent care  Push fluids  Follow up prn

## 2020-11-16 LAB — URINALYSIS, ROUTINE W REFLEX MICROSCOPIC
Bilirubin, UA: NEGATIVE
Ketones, UA: NEGATIVE
Leukocytes,UA: NEGATIVE
Nitrite, UA: NEGATIVE
Specific Gravity, UA: >=1.03 — AB (ref 1.005–1.030)
Urobilinogen, Ur: 0.2 mg/dL (ref 0.2–1.0)
pH, UA: 7 (ref 5.0–7.5)

## 2020-11-16 LAB — MICROSCOPIC EXAMINATION
Bacteria, UA: NONE SEEN
Casts: NONE SEEN /lpf
Epithelial Cells (non renal): NONE SEEN /hpf (ref 0–10)
RBC, Urine: >30 /hpf — AB (ref 0–2)
WBC, UA: >30 /hpf — AB (ref 0–5)

## 2020-11-19 ENCOUNTER — Telehealth: Payer: Self-pay | Admitting: Adult Health

## 2020-11-19 LAB — URINE CULTURE

## 2020-11-19 MED ORDER — CIPROFLOXACIN HCL 500 MG PO TABS: 500.0000 mg | ORAL_TABLET | Freq: Two times a day (BID) | ORAL | 0 refills | Status: DC

## 2020-11-19 NOTE — Telephone Encounter (Signed)
Pt aware that urine + Ecoli, stop macrobid will rx cipro

## 2020-11-19 NOTE — Telephone Encounter (Signed)
Patient called and stated that the Prescription Dr. Laurann Montana prescribed today (Patient didn't know the name of the prescription). Her Pharmacy has informed her that it's on back Order. Patient wants a return phone call. Clinical staff will follow up with patient.

## 2020-11-20 ENCOUNTER — Other Ambulatory Visit: Payer: Self-pay | Admitting: Adult Health

## 2020-11-20 MED ORDER — CIPROFLOXACIN HCL 500 MG PO TABS: 500.0000 mg | ORAL_TABLET | Freq: Two times a day (BID) | ORAL | 0 refills | Status: DC

## 2020-11-20 NOTE — Progress Notes (Signed)
Sent rx for cipro to [facility]

## 2021-01-13 ENCOUNTER — Other Ambulatory Visit: Payer: Self-pay | Admitting: Family Medicine

## 2021-01-22 ENCOUNTER — Telehealth: Payer: Self-pay | Admitting: Family Medicine

## 2021-01-22 DIAGNOSIS — E119 Type 2 diabetes mellitus without complications: Secondary | ICD-10-CM

## 2021-01-22 NOTE — Telephone Encounter (Signed)
Pharmacy requesting refill on Metformin 500 mg tablet. Take 2 tablets in the morning, one tab at noon and 2 tablets in the evening. Pt last seen 10/09/20 for DM. Please advise. Thank you

## 2021-01-23 MED ORDER — METFORMIN HCL 500 MG PO TABS: ORAL_TABLET | ORAL | 1 refills | Status: DC

## 2021-01-23 NOTE — Telephone Encounter (Signed)
Prescription sent electronically to pharmacy. 

## 2021-01-23 NOTE — Addendum Note (Signed)
Addended by: Dairl Ponder on: 01/23/2021 01:24 PM   Modules accepted: Orders

## 2021-01-23 NOTE — Telephone Encounter (Signed)
Yes, pls refill with 90 day supply and 1 refill. Thcx dr. Lovena Le

## 2021-04-08 ENCOUNTER — Other Ambulatory Visit: Payer: Self-pay | Admitting: Family Medicine

## 2021-04-08 DIAGNOSIS — E119 Type 2 diabetes mellitus without complications: Secondary | ICD-10-CM

## 2021-04-11 ENCOUNTER — Other Ambulatory Visit: Payer: Self-pay | Admitting: Family Medicine

## 2021-04-11 DIAGNOSIS — E1169 Type 2 diabetes mellitus with other specified complication: Secondary | ICD-10-CM

## 2021-04-11 DIAGNOSIS — E119 Type 2 diabetes mellitus without complications: Secondary | ICD-10-CM

## 2021-04-11 DIAGNOSIS — I10 Essential (primary) hypertension: Secondary | ICD-10-CM

## 2021-04-11 DIAGNOSIS — E785 Hyperlipidemia, unspecified: Secondary | ICD-10-CM

## 2021-04-13 NOTE — Telephone Encounter (Signed)
Needs appt to f/u dm2.    Dr. Lovena Le

## 2021-04-13 NOTE — Telephone Encounter (Signed)
Lab Results  Component Value Date   HGBA1C 8.2 (H) 10/18/2020    Lab Results  Component Value Date   CREATININE 0.88 10/18/2020     Lab Results  Component Value Date   CHOL 139 10/18/2020   HDL 70 10/18/2020   LDLCALC 58 10/18/2020   TRIG 51 10/18/2020   CHOLHDL 2.0 10/18/2020     BP Readings from Last 3 Encounters:  11/15/20 (!) 147/83  10/09/20 124/80  10/02/20 139/85

## 2021-04-14 ENCOUNTER — Other Ambulatory Visit: Payer: Self-pay

## 2021-04-14 NOTE — Telephone Encounter (Signed)
My Chart message had been sent to patient earlier to schedule follow up. Thank you

## 2021-04-14 NOTE — Telephone Encounter (Signed)
Sent my chart message to schedule appointment.

## 2021-04-14 NOTE — Telephone Encounter (Signed)
Please schedule appt and then route back to nurses to do labs

## 2021-04-14 NOTE — Telephone Encounter (Signed)
Mychart message sent to patient.

## 2021-04-24 ENCOUNTER — Ambulatory Visit: Payer: Medicare Other | Admitting: Family Medicine

## 2021-04-24 ENCOUNTER — Ambulatory Visit (HOSPITAL_COMMUNITY)
Admission: RE | Admit: 2021-04-24 | Discharge: 2021-04-24 | Disposition: A | Discharge status: Home / Self Care | Payer: Medicare Other | Source: Ambulatory Visit | Attending: Family Medicine | Admitting: Family Medicine

## 2021-04-24 ENCOUNTER — Other Ambulatory Visit: Payer: Self-pay

## 2021-04-24 VITALS — BP 130/88 | HR 63 | Temp 97.0°F | Ht 66.5 in | Wt 148.0 lb

## 2021-04-24 DIAGNOSIS — I10 Essential (primary) hypertension: Secondary | ICD-10-CM | POA: Diagnosis not present

## 2021-04-24 DIAGNOSIS — E785 Hyperlipidemia, unspecified: Secondary | ICD-10-CM

## 2021-04-24 DIAGNOSIS — Z13820 Encounter for screening for osteoporosis: Secondary | ICD-10-CM

## 2021-04-24 DIAGNOSIS — M545 Low back pain, unspecified: Secondary | ICD-10-CM

## 2021-04-24 DIAGNOSIS — E119 Type 2 diabetes mellitus without complications: Secondary | ICD-10-CM | POA: Diagnosis not present

## 2021-04-24 DIAGNOSIS — Z780 Asymptomatic menopausal state: Secondary | ICD-10-CM

## 2021-04-24 NOTE — Progress Notes (Signed)
Patient ID: Mary Vazquez, female    DOB: 31-Mar-1956, 65 y.o.   MRN: [id number]   Chief Complaint  Patient presents with  . Diabetes    Left leg discomfort in left leg and back for few months  . Hypertension    Follow up    Subjective:    HPI  Dm2- Compliant with medications. Checking blood glucose.   Not seeing any high or low numbers.  Denies polyuria or polydipsia.  Eye exam: up to date Foot exam: no new concerns. Went up when ran out of the glipizide, one time saw 285, mostly under 150 or less.   Last a1c 11/21- 8.2, improved to 7.4   HTN Pt compliant with BP meds.  No SEs Denies chest pain, sob, LE swelling, or blurry vision. Taking 62m enalapril at night.  Eating more salty items in the diet.  Has broken veins in the left leg, and lower back pain.  Taking ibuprofen and helps.  Laying down achiness and numbness in the leg.  Going on few months. No radiation of pain. Not needing it every day.  With walking it is better.   With working in yard, on back right.  But doesn't feel the left lower leg achiness or rt back pain are connected, they don't happened at the same time.  Medical History [place] a past medical history of Diabetes ([place], Hypertension, and PONV (postoperative nausea and vomiting).   Outpatient Encounter Medications as of 04/24/2021  Medication Sig  . atorvastatin (LIPITOR) 20 MG tablet TAKE 1 TABLET BY MOUTH EVERY DAY  . B Complex Vitamins (VITAMIN B COMPLEX PO) Take by mouth.  . empagliflozin (JARDIANCE) 25 MG TABS tablet Take 1 tablet (25 mg total) by mouth every morning. Needs appt.  . enalapril (VASOTEC) 20 MG tablet TAKE 1 TABLET BY MOUTH EVERY DAY  . glipiZIDE (GLUCOTROL) 5 MG tablet TAKE 1 TABLET BY MOUTH TWICE A DAY  . metFORMIN (GLUCOPHAGE) 500 MG tablet TAKE 2 TABLETS IN THE MORNING AND 2 TABLETS IN THE EVENING  . Propylene Glycol 0.6 % SOLN Apply 1 drop to eye daily.  . [DISCONTINUED] Acetaminophen (TYLENOL PO) Take by  mouth.  . [DISCONTINUED] ciprofloxacin (CIPRO) 500 MG tablet Take 1 tablet (500 mg total) by mouth 2 (two) times daily.  . [DISCONTINUED] nitrofurantoin, macrocrystal-monohydrate, (MACROBID) 100 MG capsule Take 1 capsule (100 mg total) by mouth 2 (two) times daily.  . [DISCONTINUED] phenazopyridine (PYRIDIUM) 200 MG tablet Take 1 tablet (200 mg total) by mouth 3 (three) times daily as needed for pain.   No facility-administered encounter medications on file as of 04/24/2021.     Review of Systems  Constitutional: Negative for chills and fever.  HENT: Negative for congestion, rhinorrhea and sore throat.   Respiratory: Negative for cough, shortness of breath and wheezing.   Cardiovascular: Negative for chest pain and leg swelling.  Gastrointestinal: Negative for abdominal pain, diarrhea, nausea and vomiting.  Genitourinary: Negative for dysuria and frequency.  Musculoskeletal: Positive for back pain. Negative for arthralgias.       Left lower leg pain.  Skin: Negative for rash.  Neurological: Negative for dizziness, weakness and headaches.     Vitals BP 130/88   Pulse 63   Temp (!) 97 F (36.1 C)   Ht 5' 6.5" (1.689 m)   Wt 148 lb (67.1 kg)   SpO2 99%   BMI 23.53 kg/m   Objective:   Physical Exam Vitals and nursing note reviewed.  Constitutional:  Appearance: Normal appearance.  HENT:     Head: Normocephalic and atraumatic.     Nose: Nose normal.     Mouth/Throat:     Mouth: Mucous membranes are moist.     Pharynx: Oropharynx is clear.  Eyes:     Extraocular Movements: Extraocular movements intact.     Conjunctiva/sclera: Conjunctivae normal.     Pupils: Pupils are equal, round, and reactive to light.  Cardiovascular:     Rate and Rhythm: Normal rate and regular rhythm.     Pulses: Normal pulses.     Heart sounds: Normal heart sounds.  Pulmonary:     Effort: Pulmonary effort is normal.     Breath sounds: Normal breath sounds. No wheezing, rhonchi or rales.   Musculoskeletal:        General: Normal range of motion.     Right lower leg: No edema.     Left lower leg: No edema.     Comments: +ttp over left lumbar paraspinal area. No lumbar or thoracic spinous process tenderness.  Dec rom with flexion. Neg SLR bilaterally.  Normal ms LE bilaterally and normal sensation.   Skin:    General: Skin is warm and dry.     Findings: No bruising, erythema, lesion or rash.  Neurological:     General: No focal deficit present.     Mental Status: She is alert and oriented to person, place, and time.     Cranial Nerves: No cranial nerve deficit.  Psychiatric:        Mood and Affect: Mood normal.        Behavior: Behavior normal.      Assessment and Plan   1. Type 2 diabetes mellitus without complication, without long-term current use of insulin (HCC) - CBC - CMP14+EGFR - Hemoglobin A1c - Lipid panel - Microalbumin, urine  2. Lumbar back pain - DG Lumbar Spine Complete; Future  3. Osteoporosis screening - HM DEXA SCAN  4. Encounter for osteoporosis screening in asymptomatic postmenopausal patient - [facility] DEXA SCAN  5. Primary hypertension  6. Hyperlipidemia, unspecified hyperlipidemia type    dm2- stable. a1c at goal, 7.4  Cont meds.  htn- stable. Cont meds.  Hm- ordered dexa scan.  Lumbar pain- with radiculopathy- ordered xray lumbar. Use tylenol heat/ice prn.  hld- stable. Cont meds.  Return in about 6 months (around 10/25/2021) for f/u dm2, htn, hld.   05/09/2021

## 2021-04-25 LAB — CBC
Hematocrit: 45.5 % (ref 34.0–46.6)
Hemoglobin: 15.3 g/dL (ref 11.1–15.9)
MCH: 30.1 pg (ref 26.6–33.0)
MCHC: 33.6 g/dL (ref 31.5–35.7)
MCV: 89 fL (ref 79–97)
Platelets: 181 10*3/uL (ref 150–450)
RBC: 5.09 x10E6/uL (ref 3.77–5.28)
RDW: 13.7 % (ref 11.7–15.4)
WBC: 3.7 10*3/uL (ref 3.4–10.8)

## 2021-04-25 LAB — CMP14+EGFR
ALT: 19 IU/L (ref 0–32)
AST: 24 IU/L (ref 0–40)
Albumin/Globulin Ratio: 2 (ref 1.2–2.2)
Albumin: 4.9 g/dL — ABNORMAL HIGH (ref 3.8–4.8)
Alkaline Phosphatase: 66 IU/L (ref 44–121)
BUN/Creatinine Ratio: 19 (ref 12–28)
BUN: 17 mg/dL (ref 8–27)
Bilirubin Total: 0.4 mg/dL (ref 0.0–1.2)
CO2: 22 mmol/L (ref 20–29)
Calcium: 9.5 mg/dL (ref 8.7–10.3)
Chloride: 103 mmol/L (ref 96–106)
Creatinine, Ser: 0.91 mg/dL (ref 0.57–1.00)
Globulin, Total: 2.5 g/dL (ref 1.5–4.5)
Glucose: 108 mg/dL — ABNORMAL HIGH (ref 65–99)
Potassium: 4.3 mmol/L (ref 3.5–5.2)
Sodium: 141 mmol/L (ref 134–144)
Total Protein: 7.4 g/dL (ref 6.0–8.5)
eGFR: 70 mL/min/{1.73_m2} (ref >59)

## 2021-04-25 LAB — LIPID PANEL
Chol/HDL Ratio: 1.9 ratio (ref 0.0–4.4)
Cholesterol, Total: 149 mg/dL (ref 100–199)
HDL: 77 mg/dL (ref >39)
LDL Chol Calc (NIH): 61 mg/dL (ref 0–99)
Triglycerides: 53 mg/dL (ref 0–149)
VLDL Cholesterol Cal: 11 mg/dL (ref 5–40)

## 2021-04-25 LAB — HEMOGLOBIN A1C
Est. average glucose Bld gHb Est-mCnc: 166 mg/dL
Hgb A1c MFr Bld: 7.4 % — ABNORMAL HIGH (ref 4.8–5.6)

## 2021-04-25 LAB — MICROALBUMIN, URINE: Microalbumin, Urine: 7 ug/mL

## 2021-05-11 ENCOUNTER — Other Ambulatory Visit: Payer: Self-pay | Admitting: Family Medicine

## 2021-05-11 DIAGNOSIS — E119 Type 2 diabetes mellitus without complications: Secondary | ICD-10-CM

## 2021-05-12 ENCOUNTER — Other Ambulatory Visit: Payer: Self-pay | Admitting: Family Medicine

## 2021-05-12 DIAGNOSIS — E119 Type 2 diabetes mellitus without complications: Secondary | ICD-10-CM

## 2021-05-20 ENCOUNTER — Ambulatory Visit: Payer: Medicare Other

## 2021-05-21 ENCOUNTER — Other Ambulatory Visit (HOSPITAL_COMMUNITY)
Admission: RE | Admit: 2021-05-21 | Discharge: 2021-05-21 | Disposition: A | Discharge status: Home / Self Care | Payer: Medicare Other | Source: Ambulatory Visit | Attending: Obstetrics & Gynecology | Admitting: Obstetrics & Gynecology

## 2021-05-21 ENCOUNTER — Other Ambulatory Visit (INDEPENDENT_AMBULATORY_CARE_PROVIDER_SITE_OTHER): Payer: Medicare Other | Admitting: *Deleted

## 2021-05-21 ENCOUNTER — Other Ambulatory Visit: Payer: Self-pay

## 2021-05-21 DIAGNOSIS — B379 Candidiasis, unspecified: Secondary | ICD-10-CM | POA: Diagnosis present

## 2021-05-21 MED ORDER — FLUCONAZOLE 150 MG PO TABS: ORAL_TABLET | ORAL | 1 refills | Status: DC

## 2021-05-21 NOTE — Progress Notes (Signed)
   NURSE VISIT- VAGINITIS/STD/POC  SUBJECTIVE:  Mary Vazquez is a 65 y.o. G3P3000 GYN patientfemale here for a vaginal swab for vaginitis screening.  She reports the following symptoms: vulvar itching for 1 week. Denies abnormal vaginal bleeding, significant pelvic pain, fever, or UTI symptoms.  OBJECTIVE:  There were no vitals taken for this visit.  Appears well, in no apparent distress  ASSESSMENT: Vaginal swab for vaginitis screening  PLAN: Self-collected vaginal probe for Bacterial Vaginosis, Yeast sent to lab Treatment: to be determined once results are received Follow-up as needed if symptoms persist/worsen, or new symptoms develop  Janece Canterbury  05/21/2021 3:12 PM

## 2021-05-21 NOTE — Progress Notes (Signed)
Chart reviewed for nurse visit. Agree with plan of care.will rx diflucan  Estill Dooms, NP 05/21/2021 3:20 PM

## 2021-05-21 NOTE — Addendum Note (Signed)
Addended by: Derrek Monaco A on: 05/21/2021 03:20 PM   Modules accepted: Orders

## 2021-05-26 LAB — CERVICOVAGINAL ANCILLARY ONLY
Comment: NEGATIVE
Comment: NEGATIVE
Comment: NEGATIVE

## 2021-05-29 ENCOUNTER — Telehealth: Payer: Self-pay | Admitting: *Deleted

## 2021-05-29 NOTE — Telephone Encounter (Signed)
Patient contacted in regards to swab collected on 6/8. Lab informed us there was no liquid in the container but report states quantity not sufficient under molecular comment. Patient states her symptoms have resolved since taking the Diflucan and does not wish to recollect at this time.

## 2021-06-02 ENCOUNTER — Other Ambulatory Visit (HOSPITAL_COMMUNITY): Payer: Self-pay | Admitting: Family Medicine

## 2021-06-02 DIAGNOSIS — Z1231 Encounter for screening mammogram for malignant neoplasm of breast: Secondary | ICD-10-CM

## 2021-06-18 ENCOUNTER — Ambulatory Visit (HOSPITAL_COMMUNITY): Payer: Medicare Other

## 2021-06-19 ENCOUNTER — Ambulatory Visit (HOSPITAL_COMMUNITY)
Admission: RE | Admit: 2021-06-19 | Discharge: 2021-06-19 | Disposition: A | Discharge status: Home / Self Care | Payer: Medicare Other | Source: Ambulatory Visit | Attending: Family Medicine | Admitting: Family Medicine

## 2021-06-19 DIAGNOSIS — Z1231 Encounter for screening mammogram for malignant neoplasm of breast: Secondary | ICD-10-CM | POA: Insufficient documentation

## 2021-06-23 ENCOUNTER — Other Ambulatory Visit (HOSPITAL_COMMUNITY): Payer: Self-pay | Admitting: Family Medicine

## 2021-06-23 DIAGNOSIS — R928 Other abnormal and inconclusive findings on diagnostic imaging of breast: Secondary | ICD-10-CM

## 2021-07-10 ENCOUNTER — Other Ambulatory Visit: Payer: Self-pay | Admitting: Family Medicine

## 2021-07-10 DIAGNOSIS — I10 Essential (primary) hypertension: Secondary | ICD-10-CM

## 2021-07-15 ENCOUNTER — Ambulatory Visit (HOSPITAL_COMMUNITY)
Admission: RE | Admit: 2021-07-15 | Discharge: 2021-07-15 | Disposition: A | Discharge status: Home / Self Care | Payer: Medicare Other | Source: Ambulatory Visit | Attending: Family Medicine | Admitting: Family Medicine

## 2021-07-15 ENCOUNTER — Other Ambulatory Visit: Payer: Self-pay

## 2021-07-15 DIAGNOSIS — R928 Other abnormal and inconclusive findings on diagnostic imaging of breast: Secondary | ICD-10-CM | POA: Insufficient documentation

## 2021-07-28 ENCOUNTER — Other Ambulatory Visit: Payer: Self-pay | Admitting: Adult Health

## 2021-07-28 MED ORDER — CEPHALEXIN 500 MG PO CAPS: 500.0000 mg | ORAL_CAPSULE | Freq: Four times a day (QID) | ORAL | 0 refills | Status: DC

## 2021-07-28 NOTE — Progress Notes (Signed)
Will rx keflex

## 2021-08-13 ENCOUNTER — Other Ambulatory Visit: Payer: Self-pay | Admitting: Adult Health

## 2021-08-19 ENCOUNTER — Other Ambulatory Visit: Payer: Self-pay

## 2021-08-19 ENCOUNTER — Other Ambulatory Visit (INDEPENDENT_AMBULATORY_CARE_PROVIDER_SITE_OTHER): Payer: Medicare Other

## 2021-08-19 DIAGNOSIS — R399 Unspecified symptoms and signs involving the genitourinary system: Secondary | ICD-10-CM | POA: Diagnosis not present

## 2021-08-19 LAB — POCT URINALYSIS DIPSTICK OB
Nitrite, UA: NEGATIVE
POC,PROTEIN,UA: NEGATIVE

## 2021-08-19 NOTE — Progress Notes (Signed)
   NURSE VISIT- UTI SYMPTOMS   SUBJECTIVE:  Mary Vazquez is a 65 y.o. G75P3000 female here for UTI symptoms. She is a GYN patient. She reports cloudy urine, lower abdominal pain, urinary frequency, and burning .  OBJECTIVE:  There were no vitals taken for this visit.  Appears well, in no apparent distress  Results for orders placed or performed in visit on 08/19/21 (from the past 24 hour(s))  POC Urinalysis Dipstick OB   Collection Time: 08/19/21 11:53 AM  Result Value Ref Range   Color, UA     Clarity, UA     Glucose, UA Large (3+) (A) Negative   Bilirubin, UA     Ketones, UA large    Spec Grav, UA     Blood, UA small    pH, UA     POC,PROTEIN,UA Negative Negative, Trace, Small (1+), Moderate (2+), Large (3+), 4+   Urobilinogen, UA     Nitrite, UA neg    Leukocytes, UA Moderate (2+) (A) Negative   Appearance     Odor      ASSESSMENT: GYN patient with UTI symptoms and negative nitrites  PLAN: Note routed to Derrek Monaco, AGNP   Rx sent by provider today: No Urine culture sent Call or return to clinic prn if these symptoms worsen or fail to improve as anticipated. Follow-up: as needed   Minna Dumire A Kyrie Fludd  08/19/2021 11:54 AM

## 2021-08-19 NOTE — Progress Notes (Signed)
Chart reviewed for nurse visit. Agree with plan of care.  Estill Dooms, NP 08/19/2021 1:03 PM

## 2021-08-20 LAB — URINALYSIS, ROUTINE W REFLEX MICROSCOPIC
Bilirubin, UA: NEGATIVE
Nitrite, UA: POSITIVE — AB
Specific Gravity, UA: >=1.03 — AB (ref 1.005–1.030)
Urobilinogen, Ur: 0.2 mg/dL (ref 0.2–1.0)
pH, UA: 5.5 (ref 5.0–7.5)

## 2021-08-20 LAB — MICROSCOPIC EXAMINATION
Casts: NONE SEEN /lpf
WBC, UA: >30 /hpf — AB (ref 0–5)

## 2021-08-25 ENCOUNTER — Other Ambulatory Visit: Payer: Self-pay | Admitting: Adult Health

## 2021-08-25 MED ORDER — NITROFURANTOIN MONOHYD MACRO 100 MG PO CAPS: 100.0000 mg | ORAL_CAPSULE | Freq: Two times a day (BID) | ORAL | 0 refills | Status: DC

## 2021-08-25 NOTE — Progress Notes (Signed)
Will rx macrobid for + [employer]

## 2021-08-27 LAB — URINE CULTURE

## 2021-10-07 ENCOUNTER — Other Ambulatory Visit (INDEPENDENT_AMBULATORY_CARE_PROVIDER_SITE_OTHER): Payer: Medicare Other

## 2021-10-07 ENCOUNTER — Other Ambulatory Visit: Payer: Self-pay | Admitting: Family Medicine

## 2021-10-07 ENCOUNTER — Other Ambulatory Visit: Payer: Self-pay

## 2021-10-07 DIAGNOSIS — Z23 Encounter for immunization: Secondary | ICD-10-CM

## 2021-10-07 DIAGNOSIS — I10 Essential (primary) hypertension: Secondary | ICD-10-CM

## 2021-10-30 ENCOUNTER — Telehealth: Payer: Self-pay | Admitting: Family Medicine

## 2021-10-30 NOTE — Telephone Encounter (Signed)
Patient submitted question via [email or web address]  I have a concern as to being contacted for the 6 month care.Marland KitchenMarland KitchenDiabetic and High Blood Pressure...Marland Kitchena few other issues have come in....since appointments are not made 6 months out..I am told I will be contacted as to when to schedule a time to come in.Marland KitchenMarland KitchenMarland KitchenAs my medications had run out once before I had to contact them to to let them know it must be time for my checkup because that is the point that my med refills are due.Marland KitchenMarland KitchenMarland KitchenI think now that the 6 month time was in October.Marland KitchenMarland KitchenI am concerned that my medical care has become of little concern.Marland KitchenMarland KitchenMarland KitchenI understand Covid and the other things that have arisen....but I did not receive a medical education...the choice for the medical field was whomever's.Marland KitchenMarland KitchenI would hope to receive a good quality medical care .Marland KitchenMarland KitchenNeither can I read to understand medical results...especially bloodwork...so why do I not receive contact to explain the results....this is rather concerning....my thought being that if I pass away the concern will no longer be the medical communities.......I am just concerned.Marland KitchenMarland KitchenThank you for your time... Mary Vazquez [address] [place]

## 2021-10-30 NOTE — Telephone Encounter (Signed)
I have reached out to patient and left a voicemail. I did apologize in the message stating that I was sorry that we hadn't contacted her about an appointment.

## 2021-11-03 ENCOUNTER — Other Ambulatory Visit: Payer: Self-pay | Admitting: Family Medicine

## 2021-11-03 ENCOUNTER — Other Ambulatory Visit: Payer: Self-pay | Admitting: Nurse Practitioner

## 2021-11-03 DIAGNOSIS — I10 Essential (primary) hypertension: Secondary | ICD-10-CM

## 2021-11-03 DIAGNOSIS — E119 Type 2 diabetes mellitus without complications: Secondary | ICD-10-CM

## 2021-11-18 ENCOUNTER — Ambulatory Visit (HOSPITAL_COMMUNITY)
Admission: RE | Admit: 2021-11-18 | Discharge: 2021-11-18 | Disposition: A | Discharge status: Home / Self Care | Payer: Medicare Other | Source: Ambulatory Visit | Attending: Family Medicine | Admitting: Family Medicine

## 2021-11-18 ENCOUNTER — Ambulatory Visit: Payer: Medicare Other | Admitting: Family Medicine

## 2021-11-18 ENCOUNTER — Other Ambulatory Visit: Payer: Self-pay

## 2021-11-18 VITALS — BP 132/86 | HR 64 | Ht 66.5 in | Wt 156.0 lb

## 2021-11-18 DIAGNOSIS — E785 Hyperlipidemia, unspecified: Secondary | ICD-10-CM | POA: Insufficient documentation

## 2021-11-18 DIAGNOSIS — G8929 Other chronic pain: Secondary | ICD-10-CM | POA: Diagnosis present

## 2021-11-18 DIAGNOSIS — I10 Essential (primary) hypertension: Secondary | ICD-10-CM

## 2021-11-18 DIAGNOSIS — E119 Type 2 diabetes mellitus without complications: Secondary | ICD-10-CM

## 2021-11-18 DIAGNOSIS — M25562 Pain in left knee: Secondary | ICD-10-CM | POA: Insufficient documentation

## 2021-11-18 DIAGNOSIS — M79644 Pain in right finger(s): Secondary | ICD-10-CM | POA: Diagnosis not present

## 2021-11-18 MED ORDER — NAPROXEN 375 MG PO TABS: 375.0000 mg | ORAL_TABLET | Freq: Two times a day (BID) | ORAL | 1 refills | Status: DC | PRN

## 2021-11-18 NOTE — Assessment & Plan Note (Signed)
X-ray of the left knee was obtained today.  Independent interpretation: Normal x-ray.  No evidence of degenerative change.  Anti-inflammatory medication as directed.

## 2021-11-18 NOTE — Patient Instructions (Signed)
Xrays at the hospital today. We will call with the results.  Continue your medications.  Lab today.  Follow up in 6 months.  Take care  Dr. Lacinda Axon

## 2021-11-18 NOTE — Assessment & Plan Note (Signed)
Stable.  Continue enalapril. 

## 2021-11-18 NOTE — Assessment & Plan Note (Signed)
X-ray was obtained today was independently reviewed by me.  Interpretation: Spurring noted at the MCP joint. We will start patient on anti-inflammatory medication to be used as needed.

## 2021-11-18 NOTE — Assessment & Plan Note (Signed)
At goal. Continue Lipitor. 

## 2021-11-18 NOTE — Progress Notes (Signed)
Subjective:  Patient ID: Mary Vazquez, female    DOB: 27-Jun-1956  Age: 65 y.o. MRN: [id number]  CC: Chief Complaint  Patient presents with   Diabetes    6 month follow up Establish care Pain in right thumb and left knee     HPI:  64 year old female with hypertension, diabetes, hyperlipidemia, GERD presents for follow-up.  Right thumb pain Patient reports pain of the right thumb for the past 2 to 3 days.  No fall, trauma, injury.  She is currently wearing a brace and states that it helps improve her discomfort.  She states that she is unsure why it is bothering her.  No reported swelling.  She does have decreased range of motion.  Left knee pain 54-month history of left knee pain.  Pain is located predominantly on the posterior aspect.  Worse with flexion of the knee.  She has good range of motion.  She states that her knee is not tender to palpation. Pain is worse when she is going up and down the steps.  She has had some brief feelings of instability but she states that it is very mild. No warmth or redness.  No swelling.  Hypertension BP has been fairly well controlled on enalapril.   Type 2 diabetes Uncontrolled/not at goal.  Last A1c was 7.4.  Patient does not check her blood sugar regularly.  She is currently on glipizide, metformin, and Jardiance. Needs A1c today.  Hyperlipidemia Last lipid panel reviewed.  Has been well controlled on atorvastatin.  Last LDL was 61.  Patient Active Problem List   Diagnosis Date Noted   Hyperlipidemia 11/18/2021   Pain of right thumb 11/18/2021   Chronic pain of left knee 11/18/2021   Hypertension 03/02/2013   Insomnia 03/02/2013   Diabetes ([place]) 03/02/2013   GERD (gastroesophageal reflux disease) 03/02/2013    Social Hx   Social History   Socioeconomic History   Marital status: Widowed    Spouse name: Not on file   Number of children: Not on file   Years of education: Not on file   Highest education level: Not on  file  Occupational History   Not on file  Tobacco Use   Smoking status: Never   Smokeless tobacco: Never  Vaping Use   Vaping Use: Never used  Substance and Sexual Activity   Alcohol use: No   Drug use: No   Sexual activity: Not Currently    Birth control/protection: Post-menopausal, Surgical    Comment: tubal  Other Topics Concern   Not on file  Social History Narrative   Not on file   Social Determinants of Health   Financial Resource Strain: Not on file  Food Insecurity: Not on file  Transportation Needs: Not on file  Physical Activity: Not on file  Stress: Not on file  Social Connections: Not on file    Review of Systems Per HPI  Objective:  BP 132/86   Pulse 64   Ht 5' 6.5" (1.689 m)   Wt 156 lb (70.8 kg)   SpO2 98%   BMI 24.80 kg/m   BP/Weight 11/18/2021 04/24/2021 02/16/92  Systolic BP 818 299 371  Diastolic BP 86 88 83  Wt. (Lbs) 156 148 148.5  BMI 24.8 23.53 23.61    Physical Exam Vitals and nursing note reviewed.  Constitutional:      General: She is not in acute distress.    Appearance: Normal appearance. She is not ill-appearing.  HENT:  Head: Normocephalic and atraumatic.  Eyes:     General:        Right eye: No discharge.        Left eye: No discharge.     Conjunctiva/sclera: Conjunctivae normal.  Cardiovascular:     Rate and Rhythm: Normal rate and regular rhythm.  Pulmonary:     Effort: Pulmonary effort is normal.     Breath sounds: Normal breath sounds. No wheezing or rales.  Musculoskeletal:     Comments: Right thumb -MCP joint is tender to palpation.  Decreased range of motion at the MCP joint.  Left knee -full range of motion.  No anterior joint line tenderness.  No effusion.  No discrete tenderness to palpation.  No fullness in the popliteal fossa.  Neurological:     Mental Status: She is alert.  Psychiatric:        Mood and Affect: Mood normal.        Behavior: Behavior normal.    Lab Results  Component Value Date    WBC 3.7 04/24/2021   HGB 15.3 04/24/2021   HCT 45.5 04/24/2021   PLT 181 04/24/2021   GLUCOSE 108 (H) 04/24/2021   CHOL 149 04/24/2021   TRIG 53 04/24/2021   HDL 77 04/24/2021   LDLCALC 61 04/24/2021   ALT 19 04/24/2021   AST 24 04/24/2021   NA 141 04/24/2021   K 4.3 04/24/2021   CL 103 04/24/2021   CREATININE 0.91 04/24/2021   BUN 17 04/24/2021   CO2 22 04/24/2021   HGBA1C 7.4 (H) 04/24/2021   MICROALBUR 1.22 01/08/2014     Assessment & Plan:   Problem List Items Addressed This Visit       Cardiovascular and Mediastinum   Hypertension    Stable.  Continue enalapril.        Endocrine   Diabetes ([place])    Uncontrolled per last A1c.  A1c today.  For now we will continue current medications: Metformin, glipizide, Jardiance.      Relevant Orders   Hemoglobin A1c     Other   Hyperlipidemia    At goal.  Continue Lipitor.      Pain of right thumb - Primary    X-ray was obtained today was independently reviewed by me.  Interpretation: Spurring noted at the MCP joint. We will start patient on anti-inflammatory medication to be used as needed.      Relevant Orders   DG Finger Thumb Right (Completed)   Chronic pain of left knee    X-ray of the left knee was obtained today.  Independent interpretation: Normal x-ray.  No evidence of degenerative change.  Anti-inflammatory medication as directed.      Relevant Medications   naproxen (NAPROSYN) 375 MG tablet   Other Relevant Orders   DG Knee Complete 4 Views Left (Completed)   Follow-up:  Return in about 6 months (around 05/19/2022).  [place]

## 2021-11-18 NOTE — Assessment & Plan Note (Signed)
Uncontrolled per last A1c.  A1c today.  For now we will continue current medications: Metformin, glipizide, Jardiance.

## 2021-11-19 LAB — HEMOGLOBIN A1C
Est. average glucose Bld gHb Est-mCnc: 189 mg/dL
Hgb A1c MFr Bld: 8.2 % — ABNORMAL HIGH (ref 4.8–5.6)

## 2021-12-01 ENCOUNTER — Other Ambulatory Visit: Payer: Self-pay | Admitting: Family Medicine

## 2021-12-01 MED ORDER — OZEMPIC (0.25 OR 0.5 MG/DOSE) 2 MG/1.5ML ~~LOC~~ SOPN: 0.2500 mg | PEN_INJECTOR | SUBCUTANEOUS | 1 refills | Status: AC

## 2021-12-01 MED ORDER — NOVOFINE PLUS PEN NEEDLE 32G X 4 MM MISC: 1 refills | Status: DC

## 2021-12-05 ENCOUNTER — Telehealth: Payer: Self-pay | Admitting: Family Medicine

## 2021-12-05 NOTE — Telephone Encounter (Signed)
°  Left message for patient to call back and schedule Medicare Annual Wellness Visit (AWV) in office.   If unable to come into the office for AWV,  please offer to do virtually or by telephone.  No hx of AWV eligible for AWVI as of 02/11/2021 per [facility]  Please schedule at anytime with [facility]Nurse Health Advisor.      40 Minutes appointment   Any questions, please call me at [phone]

## 2021-12-13 ENCOUNTER — Encounter: Payer: Self-pay | Admitting: Family Medicine

## 2021-12-16 ENCOUNTER — Other Ambulatory Visit: Payer: Self-pay | Admitting: Family Medicine

## 2021-12-16 DIAGNOSIS — E119 Type 2 diabetes mellitus without complications: Secondary | ICD-10-CM

## 2021-12-23 ENCOUNTER — Ambulatory Visit (INDEPENDENT_AMBULATORY_CARE_PROVIDER_SITE_OTHER): Payer: Medicare Other

## 2021-12-23 ENCOUNTER — Telehealth: Payer: Self-pay

## 2021-12-23 ENCOUNTER — Other Ambulatory Visit: Payer: Self-pay

## 2021-12-23 VITALS — Ht 67.0 in | Wt 151.8 lb

## 2021-12-23 DIAGNOSIS — Z13820 Encounter for screening for osteoporosis: Secondary | ICD-10-CM | POA: Diagnosis not present

## 2021-12-23 DIAGNOSIS — Z0000 Encounter for general adult medical examination without abnormal findings: Secondary | ICD-10-CM | POA: Diagnosis not present

## 2021-12-23 DIAGNOSIS — Z780 Asymptomatic menopausal state: Secondary | ICD-10-CM

## 2021-12-23 NOTE — Progress Notes (Signed)
Subjective:   Mary Vazquez is a 66 y.o. female who presents for an Initial Medicare Annual Wellness Visit.  Review of Systems     Cardiac Risk Factors include: advanced age (>74men, >89 women);diabetes mellitus;hypertension  IN OFFICE VISIT AT [facility].    Objective:    Today's Vitals   12/23/21 0908  Weight: 151 lb 12.8 oz (68.9 kg)  Height: 5\' 7"  (1.702 m)   Body mass index is 23.78 kg/m.  Advanced Directives 12/23/2021 06/29/2017 03/02/2016 04/10/2015 01/09/2013  Does Patient Have a Medical Advance Directive? No No No No Patient does not have advance directive  Would patient like information on creating a medical advance directive? No - Patient declined - No - patient declined information No - patient declined information -    Current Medications (verified) Outpatient Encounter Medications as of 12/23/2021  Medication Sig   atorvastatin (LIPITOR) 20 MG tablet TAKE 1 TABLET BY MOUTH EVERY DAY   empagliflozin (JARDIANCE) 25 MG TABS tablet TAKE 1 TABLET (25 MG TOTAL) BY MOUTH EVERY MORNING. NEEDS APPT.   enalapril (VASOTEC) 20 MG tablet TAKE 1 TABLET BY MOUTH EVERY DAY   glipiZIDE (GLUCOTROL) 5 MG tablet TAKE 1 TABLET BY MOUTH TWICE A DAY   Insulin Pen Needle (NOVOFINE PLUS PEN NEEDLE) 32G X 4 MM MISC Use as directed to administer Ozempic.   metFORMIN (GLUCOPHAGE) 500 MG tablet TAKE 2 TABLETS IN THE MORNING AND 2 TABLETS IN THE EVENING   naproxen (NAPROSYN) 375 MG tablet Take 1 tablet (375 mg total) by mouth 2 (two) times daily as needed for moderate pain or mild pain.   Propylene Glycol 0.6 % SOLN Apply 1 drop to eye daily.   Semaglutide,0.25 or 0.5MG /DOS, (OZEMPIC, 0.25 OR 0.5 MG/DOSE,) 2 MG/1.5ML SOPN Inject 0.25 mg into the skin once a week. After 4 weeks, increase to 0.5 mg weekly.   No facility-administered encounter medications on file as of 12/23/2021.    Allergies (verified) Septra [sulfamethoxazole-trimethoprim]   History: Past Medical History:  Diagnosis Date    Diabetes ([place])    Hypertension    PONV (postoperative nausea and vomiting)    Past Surgical History:  Procedure Laterality Date   COLONOSCOPY   08/08/2007   SLF:  A 5-mm sessile descending colon polyp removed via cold forceps.Normal retroflexed view of the rectum/ Small internal hemorrhoids, PATH: tubular adenoma   COLONOSCOPY  01/09/2013   Procedure: COLONOSCOPY;  Surgeon: Danie Binder, MD;  Location: [facility];  Service: Endoscopy;  Laterality: N/A;  8:30   TUBAL LIGATION     Family History  Problem Relation Age of Onset   Cancer Father        prostate   Diabetes Sister    Diabetes Maternal Grandmother    Diabetes Paternal Aunt    Colon cancer Neg Hx    Social History   Socioeconomic History   Marital status: Widowed    Spouse name: Not on file   Number of children: 3   Years of education: Not on file   Highest education level: Not on file  Occupational History   Not on file  Tobacco Use   Smoking status: Never   Smokeless tobacco: Never  Vaping Use   Vaping Use: Never used  Substance and Sexual Activity   Alcohol use: No   Drug use: No   Sexual activity: Not Currently    Birth control/protection: Post-menopausal, Surgical    Comment: tubal  Other Topics Concern   Not on file  Social History Narrative   Husband passed 01/2020.   3 children    2 grandchildren   Social Determinants of Health   Financial Resource Strain: Low Risk    Difficulty of Paying Living Expenses: Not hard at all  Food Insecurity: No Food Insecurity   Worried About Charity fundraiser in the Last Year: Never true   Arboriculturist in the Last Year: Never true  Transportation Needs: No Transportation Needs   Lack of Transportation (Medical): No   Lack of Transportation (Non-Medical): No  Physical Activity: Sufficiently Active   Days of Exercise per Week: 5 days   Minutes of Exercise per Session: 50 min  Stress: No Stress Concern Present   Feeling of Stress : Only a little   Social Connections: Moderately Integrated   Frequency of Communication with Friends and Family: More than three times a week   Frequency of Social Gatherings with Friends and Family: More than three times a week   Attends Religious Services: More than 4 times per year   Active Member of [employer] or Organizations: Yes   Attends Archivist Meetings: More than 4 times per year   Marital Status: Widowed    Tobacco Counseling Counseling given: Not Answered   Clinical Intake:  Pre-visit preparation completed: Yes  Pain : No/denies pain     BMI - recorded: 23.78 Nutritional Status: BMI of 19-24  Normal Nutritional Risks: None Diabetes: Yes  How often do you need to have someone help you when you read instructions, pamphlets, or other written materials from your doctor or pharmacy?: 1 - Never  Diabetic?Nutrition Risk Assessment:  Has the patient had any N/V/D within the last 2 months?  Yes  Does the patient have any non-healing wounds?  No  Has the patient had any unintentional weight loss or weight gain?  No   Diabetes:  Is the patient diabetic?  Yes  If diabetic, was a CBG obtained today?  No  Did the patient bring in their glucometer from home?  No  How often do you monitor your CBG's? Daily.   Financial Strains and Diabetes Management:  Are you having any financial strains with the device, your supplies or your medication? No .  Does the patient want to be seen by Chronic Care Management for management of their diabetes?  No  Would the patient like to be referred to a Nutritionist or for Diabetic Management?  No   Diabetic Exams:  Diabetic Eye Exam: Completed 2022. Pt has been advised about the importance in completing this exam.  Diabetic Foot Exam: Completed 11/24/2018. Pt has been advised about the importance in completing this exam.   Interpreter Needed?: No  Information entered by :: MJ Emillee Talsma, LPN   Activities of Daily Living In your present state  of health, do you have any difficulty performing the following activities: 12/23/2021 12/20/2021  Hearing? N N  Vision? N -  Difficulty concentrating or making decisions? N N  Walking or climbing stairs? N N  Dressing or bathing? N N  Doing errands, shopping? N N  Preparing Food and eating ? N N  Using the Toilet? N N  In the past six months, have you accidently leaked urine? N N  Do you have problems with loss of bowel control? N N  Managing your Medications? N N  Managing your Finances? N N  Housekeeping or managing your Housekeeping? N N  Some recent data might be hidden  Patient Care Team: Coral Spikes, DO as PCP - General (Family Medicine) Danie Binder, MD (Inactive) as Attending Physician (Gastroenterology)  Indicate any recent Medical Services you may have received from other than [facility] providers in the past year (date may be approximate).     Assessment:   This is a routine wellness examination for Mary Vazquez.  Hearing/Vision screen Hearing Screening - Comments:: No hearing issues. Vision Screening - Comments:: Glasses. [facility] 20[facility].  Dietary issues and exercise activities discussed: Current Exercise Habits: Home exercise routine, Type of exercise: walking, Time (Minutes): 50, Frequency (Times/Week): 5, Weekly Exercise (Minutes/Week): 250, Intensity: Mild, Exercise limited by: cardiac condition(s)   Goals Addressed             This Visit's Progress    Have 3 meals a day       Cook more at home.       Depression Screen PHQ 2/9 Scores 12/23/2021 11/18/2021 04/24/2021 10/02/2020 11/01/2017 03/02/2016  PHQ - 2 Score 0 0 0 0 0 0  PHQ- 9 Score - - - 0 - -    Fall Risk Fall Risk  12/23/2021 12/20/2021 11/18/2021 04/24/2021 10/02/2020  Falls in the past year? 1 0 0 0 0  Number falls in past yr: 0 - - 0 -  Injury with Fall? 0 - - 0 -  Risk for fall due to : History of fall(s) - No Fall Risks - -  Follow up Falls prevention discussed - Falls evaluation  completed Falls evaluation completed -    FALL RISK PREVENTION PERTAINING TO THE HOME:  Any stairs in or around the home? Yes  If so, are there any without handrails? No  Home free of loose throw rugs in walkways, pet beds, electrical cords, etc? Yes  Adequate lighting in your home to reduce risk of falls? Yes   ASSISTIVE DEVICES UTILIZED TO PREVENT FALLS:  Life alert? No  Use of a cane, walker or w/c? No  Grab bars in the bathroom? Yes  Shower chair or bench in shower? Yes  Elevated toilet seat or a handicapped toilet? No   TIMED UP AND GO:  Was the test performed? Yes .  Length of time to ambulate 10 feet: 10 sec.   Gait steady and fast without use of assistive device  Cognitive Function:     6CIT Screen 12/23/2021  What Year? 0 points  What month? 0 points  What time? 0 points  Count back from 20 0 points  Months in reverse 0 points  Repeat phrase 2 points  Total Score 2    Immunizations Immunization History  Administered Date(s) Administered   Fluad Quad(high Dose 65+) 10/09/2020, 10/07/2021   Influenza, High Dose Seasonal PF 10/23/2019   Influenza,inj,Quad PF,6+ Mos 08/23/2014, 11/18/2015, 11/01/2017, 10/18/2018   Influenza-Unspecified 10/01/2016, 10/18/2018   Moderna SARS-COV2 Booster Vaccination 10/30/2020, 06/10/2021   Moderna Sars-Covid-2 Vaccination 03/24/2020, 04/22/2020   Td 06/29/2016    TDAP status: Up to date  Flu Vaccine status: Up to date  Pneumococcal vaccine status: Due, Education has been provided regarding the importance of this vaccine. Advised may receive this vaccine at local pharmacy or Health Dept. Aware to provide a copy of the vaccination record if obtained from local pharmacy or Health Dept. Verbalized acceptance and understanding.  Covid-19 vaccine status: Completed vaccines  Qualifies for Shingles Vaccine? Yes   Zostavax completed No   Shingrix Completed?: No.    Education has been provided regarding the importance of this  vaccine. Patient has been advised to call insurance company to determine out of pocket expense if they have not yet received this vaccine. Advised may also receive vaccine at local pharmacy or Health Dept. Verbalized acceptance and understanding.  Screening Tests Health Maintenance  Topic Date Due   Pneumonia Vaccine 22+ Years old (1 - PCV) Never done   Hepatitis C Screening  Never done   Zoster Vaccines- Shingrix (1 of 2) Never done   FOOT EXAM  11/25/2019   COVID-19 Vaccine (3 - Moderna risk series) 07/08/2021   OPHTHALMOLOGY EXAM  01/13/2022   HEMOGLOBIN A1C  05/19/2022   COLONOSCOPY (Pts 45-33yrs Insurance coverage will need to be confirmed)  01/09/2023   MAMMOGRAM  06/20/2023   TETANUS/TDAP  06/29/2026   INFLUENZA VACCINE  Completed   DEXA SCAN  Completed   HPV VACCINES  Aged Out    Health Maintenance  Health Maintenance Due  Topic Date Due   Pneumonia Vaccine 78+ Years old (1 - PCV) Never done   Hepatitis C Screening  Never done   Zoster Vaccines- Shingrix (1 of 2) Never done   FOOT EXAM  11/25/2019   COVID-19 Vaccine (3 - Moderna risk series) 07/08/2021    Colorectal cancer screening: Type of screening: Colonoscopy. Completed 01/09/2013. Repeat every 10 years  Mammogram status: Completed 06/19/2021. Repeat every year  Bone Density status: Ordered 12/23/2021. Pt provided with contact info and advised to call to schedule appt.  Lung Cancer Screening: (Low Dose CT Chest recommended if Age 4-80 years, 30 pack-year currently smoking OR have quit w/in 15years.) does not qualify.    Additional Screening:  Hepatitis C Screening: does qualify; Completed DUE  Vision Screening: Recommended annual ophthalmology exams for early detection of glaucoma and other disorders of the eye. Is the patient up to date with their annual eye exam?  Yes  Who is the provider or what is the name of the office in which the patient attends annual eye exams? [facility]. If pt is not  established with a provider, would they like to be referred to a provider to establish care? No .   Dental Screening: Recommended annual dental exams for proper oral hygiene  Community Resource Referral / Chronic Care Management: CRR required this visit?  No   CCM required this visit?  No      Plan:     I have personally reviewed and noted the following in the patients chart:   Medical and social history Use of alcohol, tobacco or illicit drugs  Current medications and supplements including opioid prescriptions. Patient is not currently taking opioid prescriptions. Functional ability and status Nutritional status Physical activity Advanced directives List of other physicians Hospitalizations, surgeries, and ER visits in previous 12 months Vitals Screenings to include cognitive, depression, and falls Referrals and appointments  In addition, I have reviewed and discussed with patient certain preventive protocols, quality metrics, and best practice recommendations. A written personalized care plan for preventive services as well as general preventive health recommendations were provided to patient.     Chriss Driver, LPN   6/96/2952   Nurse Notes: Pt is up to date with colonoscopy and mammogram. Order placed to schedule bone density. Discussed Prevnar and Shingrix vaccines and how to obtain.

## 2021-12-23 NOTE — Patient Instructions (Signed)
Mary Vazquez , Thank you for taking time to come for your Medicare Wellness Visit. I appreciate your ongoing commitment to your health goals. Please review the following plan we discussed and let me know if I can assist you in the future.   Screening recommendations/referrals: Colonoscopy: Done 01/09/2013 Repeat in 10 years  Mammogram: Done 06/19/2021 Repeat annually  Bone Density: Ordered today.  Recommended yearly ophthalmology/optometry visit for glaucoma screening and checkup Recommended yearly dental visit for hygiene and checkup  Vaccinations: Influenza vaccine: Done 10/07/2021 Repeat annually  Pneumococcal vaccine: Due  Tdap vaccine: 06/29/2016 Repeat in 10 years  Shingles vaccine: Shingrix discussed. Please contact your pharmacy for coverage information.     Covid-19:Done 4/11/201, 04/22/2020, 10/30/2020 and 06/10/2021  Advanced directives: Advance directive discussed with you today. I have provided a copy for you to complete at home and have notarized. Once this is complete please bring a copy in to our office so we can scan it into your chart.   Conditions/risks identified: Aim for 30 minutes of exercise or brisk walking each day, drink 6-8 glasses of water and eat lots of fruits and vegetables. KEEP UP THE GOOD WORK!!  Next appointment: Follow up in one year for your annual wellness visit 2024.   Preventive Care 38 Years and Older, Female Preventive care refers to lifestyle choices and visits with your health care provider that can promote health and wellness. What does preventive care include? A yearly physical exam. This is also called an annual well check. Dental exams once or twice a year. Routine eye exams. Ask your health care provider how often you should have your eyes checked. Personal lifestyle choices, including: Daily care of your teeth and gums. Regular physical activity. Eating a healthy diet. Avoiding tobacco and drug use. Limiting alcohol  use. Practicing safe sex. Taking low-dose aspirin every day. Taking vitamin and mineral supplements as recommended by your health care provider. What happens during an annual well check? The services and screenings done by your health care provider during your annual well check will depend on your age, overall health, lifestyle risk factors, and family history of disease. Counseling  Your health care provider may ask you questions about your: Alcohol use. Tobacco use. Drug use. Emotional well-being. Home and relationship well-being. Sexual activity. Eating habits. History of falls. Memory and ability to understand (cognition). Work and work Statistician. Reproductive health. Screening  You may have the following tests or measurements: Height, weight, and BMI. Blood pressure. Lipid and cholesterol levels. These may be checked every 5 years, or more frequently if you are over 54 years old. Skin check. Lung cancer screening. You may have this screening every year starting at age 73 if you have a 30-pack-year history of smoking and currently smoke or have quit within the past 15 years. Fecal occult blood test (FOBT) of the stool. You may have this test every year starting at age 11. Flexible sigmoidoscopy or colonoscopy. You may have a sigmoidoscopy every 5 years or a colonoscopy every 10 years starting at age 90. Hepatitis C blood test. Hepatitis B blood test. Sexually transmitted disease (STD) testing. Diabetes screening. This is done by checking your blood sugar (glucose) after you have not eaten for a while (fasting). You may have this done every 1-3 years. Bone density scan. This is done to screen for osteoporosis. You may have this done starting at age 74. Mammogram. This may be done every 1-2 years. Talk to your health care provider about how often you  should have regular mammograms. Talk with your health care provider about your test results, treatment options, and if necessary,  the need for more tests. Vaccines  Your health care provider may recommend certain vaccines, such as: Influenza vaccine. This is recommended every year. Tetanus, diphtheria, and acellular pertussis (Tdap, Td) vaccine. You may need a Td booster every 10 years. Zoster vaccine. You may need this after age 50. Pneumococcal 13-valent conjugate (PCV13) vaccine. One dose is recommended after age 36. Pneumococcal polysaccharide (PPSV23) vaccine. One dose is recommended after age 28. Talk to your health care provider about which screenings and vaccines you need and how often you need them. This information is not intended to replace advice given to you by your health care provider. Make sure you discuss any questions you have with your health care provider. Document Released: 12/27/2015 Document Revised: 08/19/2016 Document Reviewed: 10/01/2015 Elsevier Interactive Patient Education  2017 [place] Prevention in the Home Falls can cause injuries. They can happen to people of all ages. There are many things you can do to make your home safe and to help prevent falls. What can I do on the outside of my home? Regularly fix the edges of walkways and driveways and fix any cracks. Remove anything that might make you trip as you walk through a door, such as a raised step or threshold. Trim any bushes or trees on the path to your home. Use bright outdoor lighting. Clear any walking paths of anything that might make someone trip, such as rocks or tools. Regularly check to see if handrails are loose or broken. Make sure that both sides of any steps have handrails. Any raised decks and porches should have guardrails on the edges. Have any leaves, snow, or ice cleared regularly. Use sand or salt on walking paths during winter. Clean up any spills in your garage right away. This includes oil or grease spills. What can I do in the bathroom? Use night lights. Install grab bars by the toilet and in the  tub and shower. Do not use towel bars as grab bars. Use non-skid mats or decals in the tub or shower. If you need to sit down in the shower, use a plastic, non-slip stool. Keep the floor dry. Clean up any water that spills on the floor as soon as it happens. Remove soap buildup in the tub or shower regularly. Attach bath mats securely with double-sided non-slip rug tape. Do not have throw rugs and other things on the floor that can make you trip. What can I do in the bedroom? Use night lights. Make sure that you have a light by your bed that is easy to reach. Do not use any sheets or blankets that are too big for your bed. They should not hang down onto the floor. Have a firm chair that has side arms. You can use this for support while you get dressed. Do not have throw rugs and other things on the floor that can make you trip. What can I do in the kitchen? Clean up any spills right away. Avoid walking on wet floors. Keep items that you use a lot in easy-to-reach places. If you need to reach something above you, use a strong step stool that has a grab bar. Keep electrical cords out of the way. Do not use floor polish or wax that makes floors slippery. If you must use wax, use non-skid floor wax. Do not have throw rugs and other things on the  floor that can make you trip. What can I do with my stairs? Do not leave any items on the stairs. Make sure that there are handrails on both sides of the stairs and use them. Fix handrails that are broken or loose. Make sure that handrails are as long as the stairways. Check any carpeting to make sure that it is firmly attached to the stairs. Fix any carpet that is loose or worn. Avoid having throw rugs at the top or bottom of the stairs. If you do have throw rugs, attach them to the floor with carpet tape. Make sure that you have a light switch at the top of the stairs and the bottom of the stairs. If you do not have them, ask someone to add them for  you. What else can I do to help prevent falls? Wear shoes that: Do not have high heels. Have rubber bottoms. Are comfortable and fit you well. Are closed at the toe. Do not wear sandals. If you use a stepladder: Make sure that it is fully opened. Do not climb a closed stepladder. Make sure that both sides of the stepladder are locked into place. Ask someone to hold it for you, if possible. Clearly mark and make sure that you can see: Any grab bars or handrails. First and last steps. Where the edge of each step is. Use tools that help you move around (mobility aids) if they are needed. These include: Canes. Walkers. Scooters. Crutches. Turn on the lights when you go into a dark area. Replace any light bulbs as soon as they burn out. Set up your furniture so you have a clear path. Avoid moving your furniture around. If any of your floors are uneven, fix them. If there are any pets around you, be aware of where they are. Review your medicines with your doctor. Some medicines can make you feel dizzy. This can increase your chance of falling. Ask your doctor what other things that you can do to help prevent falls. This information is not intended to replace advice given to you by your health care provider. Make sure you discuss any questions you have with your health care provider. Document Released: 09/26/2009 Document Revised: 05/07/2016 Document Reviewed: 01/04/2015 Elsevier Interactive Patient Education  2017 [employer].

## 2021-12-23 NOTE — Telephone Encounter (Signed)
Pt here for AWV and when discussing her medications, pt states she took her first dose of Ozempic yesterday morning. Pt states that she started feeling nauseated soon after and last night she started vomiting. Pt states she vomited through this morning. Pt asks should she try the second dose next week?

## 2021-12-25 ENCOUNTER — Encounter: Payer: Self-pay | Admitting: Family Medicine

## 2021-12-26 ENCOUNTER — Other Ambulatory Visit: Payer: Self-pay | Admitting: Family Medicine

## 2021-12-26 MED ORDER — ONDANSETRON 4 MG PO TBDP: 4.0000 mg | ORAL_TABLET | Freq: Three times a day (TID) | ORAL | 0 refills | Status: DC | PRN

## 2021-12-31 ENCOUNTER — Other Ambulatory Visit: Payer: Self-pay

## 2021-12-31 ENCOUNTER — Ambulatory Visit (HOSPITAL_COMMUNITY)
Admission: RE | Admit: 2021-12-31 | Discharge: 2021-12-31 | Disposition: A | Discharge status: Home / Self Care | Payer: Medicare Other | Source: Ambulatory Visit | Attending: Family Medicine | Admitting: Family Medicine

## 2021-12-31 DIAGNOSIS — Z13820 Encounter for screening for osteoporosis: Secondary | ICD-10-CM | POA: Diagnosis present

## 2021-12-31 DIAGNOSIS — Z780 Asymptomatic menopausal state: Secondary | ICD-10-CM | POA: Insufficient documentation

## 2022-01-02 ENCOUNTER — Other Ambulatory Visit: Payer: Self-pay | Admitting: Family Medicine

## 2022-01-19 ENCOUNTER — Other Ambulatory Visit: Payer: Self-pay | Admitting: Family Medicine

## 2022-01-19 DIAGNOSIS — I10 Essential (primary) hypertension: Secondary | ICD-10-CM

## 2022-04-02 ENCOUNTER — Encounter: Payer: Self-pay | Admitting: Family Medicine

## 2022-04-03 ENCOUNTER — Other Ambulatory Visit: Payer: Self-pay | Admitting: Family Medicine

## 2022-04-03 MED ORDER — FLUCONAZOLE 150 MG PO TABS: 150.0000 mg | ORAL_TABLET | Freq: Once | ORAL | 0 refills | Status: AC

## 2022-04-24 MED ORDER — FLUCONAZOLE 150 MG PO TABS: ORAL_TABLET | ORAL | 0 refills | Status: DC

## 2022-05-19 ENCOUNTER — Ambulatory Visit: Payer: Medicare Other | Admitting: Family Medicine

## 2022-05-19 VITALS — BP 146/78 | HR 68 | Temp 97.0°F | Ht 67.0 in | Wt 154.0 lb

## 2022-05-19 DIAGNOSIS — Z130 Encounter for screening for diseases of the blood and blood-forming organs and certain disorders involving the immune mechanism: Secondary | ICD-10-CM

## 2022-05-19 DIAGNOSIS — E785 Hyperlipidemia, unspecified: Secondary | ICD-10-CM

## 2022-05-19 DIAGNOSIS — M79641 Pain in right hand: Secondary | ICD-10-CM

## 2022-05-19 DIAGNOSIS — K644 Residual hemorrhoidal skin tags: Secondary | ICD-10-CM | POA: Insufficient documentation

## 2022-05-19 DIAGNOSIS — E119 Type 2 diabetes mellitus without complications: Secondary | ICD-10-CM | POA: Diagnosis not present

## 2022-05-19 DIAGNOSIS — I10 Essential (primary) hypertension: Secondary | ICD-10-CM | POA: Diagnosis not present

## 2022-05-19 DIAGNOSIS — M79643 Pain in unspecified hand: Secondary | ICD-10-CM | POA: Insufficient documentation

## 2022-05-19 DIAGNOSIS — M25473 Effusion, unspecified ankle: Secondary | ICD-10-CM | POA: Insufficient documentation

## 2022-05-19 MED ORDER — ENALAPRIL MALEATE 20 MG PO TABS: 20.0000 mg | ORAL_TABLET | Freq: Every day | ORAL | 3 refills | Status: DC

## 2022-05-19 MED ORDER — HYDROCORTISONE (PERIANAL) 2.5 % EX CREA: 1.0000 "application " | TOPICAL_CREAM | Freq: Two times a day (BID) | CUTANEOUS | 3 refills | Status: DC

## 2022-05-19 MED ORDER — ATORVASTATIN CALCIUM 20 MG PO TABS: 20.0000 mg | ORAL_TABLET | Freq: Every day | ORAL | 3 refills | Status: DC

## 2022-05-19 MED ORDER — GLIPIZIDE 5 MG PO TABS: 5.0000 mg | ORAL_TABLET | Freq: Two times a day (BID) | ORAL | 3 refills | Status: DC

## 2022-05-19 MED ORDER — METFORMIN HCL 500 MG PO TABS: 1000.0000 mg | ORAL_TABLET | Freq: Two times a day (BID) | ORAL | 3 refills | Status: DC

## 2022-05-19 MED ORDER — EMPAGLIFLOZIN 25 MG PO TABS: 25.0000 mg | ORAL_TABLET | Freq: Every day | ORAL | 3 refills | Status: DC

## 2022-05-19 NOTE — Assessment & Plan Note (Signed)
Treating with Anusol.

## 2022-05-19 NOTE — Assessment & Plan Note (Signed)
No significant edema noted exam.  Advised elevation.

## 2022-05-19 NOTE — Progress Notes (Signed)
Subjective:  Patient ID: Mary Vazquez, female    DOB: 1956-10-13  Age: 66 y.o. MRN: [id number]  CC: Chief Complaint  Patient presents with   6 month follow up     DM , HTN, HLD   joint pain in hand and ankle   Hemorrhoids    HPI:  66 year old female with hypertension, GERD, diabetes, hyperlipidemia presents for follow-up.  Type 2 diabetes In need of A1c.  Last A1c was 8.2.  Did not tolerate Ozempic. Current medications: Jardiance 25 mg daily, glipizide 5 mg twice daily, metformin 1000 mg twice daily.  Hypertension BP mildly elevated here today.  She is currently on enalapril 20 mg daily.  Hyperlipidemia Last LDL was 61 and well controlled.  She is tolerating Lipitor well.  Hand pain Patient reports ongoing pain of the right hand.  She has had some mild swelling and has also had pain of the PIP joint of the index finger.  Likely has arthritis.  She is currently wearing a compression glove.  External hemorrhoids Patient reports that she has recently had trouble with external hemorrhoids as of yesterday.  She has been using Preparation H without resolution.  Ankle swelling Patient also reports recent ankle swelling, right greater than left.  No recent fall, trauma, injury.  No reports of shortness of breath.  She states that it improves with elevation.  Patient Active Problem List   Diagnosis Date Noted   Hand pain 05/19/2022   External hemorrhoids 05/19/2022   Ankle swelling 05/19/2022   Hyperlipidemia 11/18/2021   Hypertension 03/02/2013   Diabetes ([place]) 03/02/2013   GERD (gastroesophageal reflux disease) 03/02/2013    Social Hx   Social History   Socioeconomic History   Marital status: Widowed    Spouse name: Not on file   Number of children: 3   Years of education: Not on file   Highest education level: Not on file  Occupational History   Not on file  Tobacco Use   Smoking status: Never   Smokeless tobacco: Never  Vaping Use   Vaping Use: Never  used  Substance and Sexual Activity   Alcohol use: No   Drug use: No   Sexual activity: Not Currently    Birth control/protection: Post-menopausal, Surgical    Comment: tubal  Other Topics Concern   Not on file  Social History Narrative   Husband passed 01/2020.   3 children    2 grandchildren   Social Determinants of Health   Financial Resource Strain: Low Risk    Difficulty of Paying Living Expenses: Not hard at all  Food Insecurity: No Food Insecurity   Worried About Charity fundraiser in the Last Year: Never true   Ran Out of Food in the Last Year: Never true  Transportation Needs: No Transportation Needs   Lack of Transportation (Medical): No   Lack of Transportation (Non-Medical): No  Physical Activity: Sufficiently Active   Days of Exercise per Week: 5 days   Minutes of Exercise per Session: 50 min  Stress: No Stress Concern Present   Feeling of Stress : Only a little  Social Connections: Moderately Integrated   Frequency of Communication with Friends and Family: More than three times a week   Frequency of Social Gatherings with Friends and Family: More than three times a week   Attends Religious Services: More than 4 times per year   Active Member of [employer] or Organizations: Yes   Attends Archivist Meetings: More than  4 times per year   Marital Status: Widowed    Review of Systems Per HPI  Objective:  BP (!) 146/78   Pulse 68   Temp (!) 97 F (36.1 C)   Ht _0  (1.702 m)   Wt 154 lb (69.9 kg)   SpO2 99%   BMI 24.12 kg/m      05/19/2022    8:45 AM 05/19/2022    8:17 AM 12/23/2021    9:08 AM  BP/Weight  Systolic BP 488 891   Diastolic BP 78 76   Wt. (Lbs)  154 151.8  BMI  24.12 kg/m2 23.78 kg/m2    Physical Exam Constitutional:      General: She is not in acute distress.    Appearance: Normal appearance. She is not ill-appearing.  HENT:     Head: Normocephalic and atraumatic.  Eyes:     General:        Right eye: No discharge.         Left eye: No discharge.     Conjunctiva/sclera: Conjunctivae normal.  Cardiovascular:     Rate and Rhythm: Normal rate and regular rhythm.  Pulmonary:     Effort: Pulmonary effort is normal.     Breath sounds: Normal breath sounds. No wheezing, rhonchi or rales.  Neurological:     Mental Status: She is alert.  Psychiatric:        Mood and Affect: Mood normal.        Behavior: Behavior normal.    Lab Results  Component Value Date   WBC 3.7 04/24/2021   HGB 15.3 04/24/2021   HCT 45.5 04/24/2021   PLT 181 04/24/2021   GLUCOSE 108 (H) 04/24/2021   CHOL 149 04/24/2021   TRIG 53 04/24/2021   HDL 77 04/24/2021   LDLCALC 61 04/24/2021   ALT 19 04/24/2021   AST 24 04/24/2021   NA 141 04/24/2021   K 4.3 04/24/2021   CL 103 04/24/2021   CREATININE 0.91 04/24/2021   BUN 17 04/24/2021   CO2 22 04/24/2021   HGBA1C 8.2 (H) 11/18/2021   MICROALBUR 1.22 01/08/2014     Assessment & Plan:   Problem List Items Addressed This Visit       Cardiovascular and Mediastinum   Hypertension    BP mildly elevated today.  We will continue monitor.  Labs ordered.  Continue enalapril.       Relevant Medications   atorvastatin (LIPITOR) 20 MG tablet   enalapril (VASOTEC) 20 MG tablet   External hemorrhoids    Treating with Anusol.       Relevant Medications   atorvastatin (LIPITOR) 20 MG tablet   enalapril (VASOTEC) 20 MG tablet     Endocrine   Diabetes ([place]) - Primary    A1c today for further assessment.  Continue glipizide, metformin, and Jardiance.       Relevant Medications   atorvastatin (LIPITOR) 20 MG tablet   enalapril (VASOTEC) 20 MG tablet   glipiZIDE (GLUCOTROL) 5 MG tablet   metFORMIN (GLUCOPHAGE) 500 MG tablet   empagliflozin (JARDIANCE) 25 MG TABS tablet   Other Relevant Orders   CMP14+EGFR   Hemoglobin A1c     Other   Hyperlipidemia    At goal.  Continue Lipitor.       Relevant Medications   atorvastatin (LIPITOR) 20 MG tablet   enalapril (VASOTEC)  20 MG tablet   Other Relevant Orders   Lipid panel   Hand pain    Likely secondary  to osteoarthritis.  Advised Tylenol as needed.  Supportive care.       Ankle swelling    No significant edema noted exam.  Advised elevation.       Other Visit Diagnoses     Screening for deficiency anemia       Relevant Orders   CBC       Meds ordered this encounter  Medications   atorvastatin (LIPITOR) 20 MG tablet    Sig: Take 1 tablet (20 mg total) by mouth daily.    Dispense:  30 tablet    Refill:  3   enalapril (VASOTEC) 20 MG tablet    Sig: Take 1 tablet (20 mg total) by mouth daily.    Dispense:  30 tablet    Refill:  3   glipiZIDE (GLUCOTROL) 5 MG tablet    Sig: Take 1 tablet (5 mg total) by mouth 2 (two) times daily before a meal.    Dispense:  60 tablet    Refill:  3   metFORMIN (GLUCOPHAGE) 500 MG tablet    Sig: Take 2 tablets (1,000 mg total) by mouth 2 (two) times daily with a meal.    Dispense:  120 tablet    Refill:  3   empagliflozin (JARDIANCE) 25 MG TABS tablet    Sig: Take 1 tablet (25 mg total) by mouth daily.    Dispense:  30 tablet    Refill:  3   hydrocortisone (ANUSOL-HC) 2.5 % rectal cream    Sig: Place 1 application. rectally 2 (two) times daily.    Dispense:  30 g    Refill:  3    Follow-up:  Return in about 3 months (around 08/19/2022) for HTN follow up, Diabetes follow up.  [place]

## 2022-05-19 NOTE — Assessment & Plan Note (Signed)
BP mildly elevated today.  We will continue monitor.  Labs ordered.  Continue enalapril.

## 2022-05-19 NOTE — Assessment & Plan Note (Signed)
A1c today for further assessment.  Continue glipizide, metformin, and Jardiance.

## 2022-05-19 NOTE — Assessment & Plan Note (Signed)
Likely secondary to osteoarthritis.  Advised Tylenol as needed.  Supportive care.

## 2022-05-19 NOTE — Patient Instructions (Signed)
Tylenol and/or ibuprofen as needed for the hand pain.  I have sent in a medication for the hemorrhoids.  Stay active and elevate your legs.  Labs today.  I have refilled your medications.  Follow up in 3 months.

## 2022-05-19 NOTE — Assessment & Plan Note (Signed)
At goal. Continue Lipitor. 

## 2022-05-20 ENCOUNTER — Other Ambulatory Visit: Payer: Self-pay | Admitting: *Deleted

## 2022-05-20 DIAGNOSIS — E119 Type 2 diabetes mellitus without complications: Secondary | ICD-10-CM

## 2022-05-20 LAB — CMP14+EGFR
ALT: 16 IU/L (ref 0–32)
AST: 23 IU/L (ref 0–40)
Albumin/Globulin Ratio: 1.7 (ref 1.2–2.2)
Albumin: 4.6 g/dL (ref 3.8–4.8)
Alkaline Phosphatase: 67 IU/L (ref 44–121)
BUN/Creatinine Ratio: 16 (ref 12–28)
BUN: 15 mg/dL (ref 8–27)
Bilirubin Total: 0.3 mg/dL (ref 0.0–1.2)
CO2: 24 mmol/L (ref 20–29)
Calcium: 10 mg/dL (ref 8.7–10.3)
Chloride: 103 mmol/L (ref 96–106)
Creatinine, Ser: 0.92 mg/dL (ref 0.57–1.00)
Globulin, Total: 2.7 g/dL (ref 1.5–4.5)
Glucose: 120 mg/dL — ABNORMAL HIGH (ref 70–99)
Potassium: 4.3 mmol/L (ref 3.5–5.2)
Sodium: 141 mmol/L (ref 134–144)
Total Protein: 7.3 g/dL (ref 6.0–8.5)
eGFR: 68 mL/min/{1.73_m2} (ref >59)

## 2022-05-20 LAB — CBC
Hematocrit: 47.3 % — ABNORMAL HIGH (ref 34.0–46.6)
Hemoglobin: 15.9 g/dL (ref 11.1–15.9)
MCH: 30.2 pg (ref 26.6–33.0)
MCHC: 33.6 g/dL (ref 31.5–35.7)
MCV: 90 fL (ref 79–97)
Platelets: 211 10*3/uL (ref 150–450)
RBC: 5.27 x10E6/uL (ref 3.77–5.28)
RDW: 13.1 % (ref 11.7–15.4)
WBC: 4.5 10*3/uL (ref 3.4–10.8)

## 2022-05-20 LAB — HEMOGLOBIN A1C
Est. average glucose Bld gHb Est-mCnc: 177 mg/dL
Hgb A1c MFr Bld: 7.8 % — ABNORMAL HIGH (ref 4.8–5.6)

## 2022-05-20 LAB — LIPID PANEL
Chol/HDL Ratio: 2.1 ratio (ref 0.0–4.4)
Cholesterol, Total: 156 mg/dL (ref 100–199)
HDL: 76 mg/dL (ref >39)
LDL Chol Calc (NIH): 68 mg/dL (ref 0–99)
Triglycerides: 59 mg/dL (ref 0–149)
VLDL Cholesterol Cal: 12 mg/dL (ref 5–40)

## 2022-05-20 MED ORDER — GLIPIZIDE 10 MG PO TABS: 10.0000 mg | ORAL_TABLET | Freq: Two times a day (BID) | ORAL | 3 refills | Status: DC

## 2022-05-29 ENCOUNTER — Encounter: Payer: Self-pay | Admitting: Family Medicine

## 2022-06-05 ENCOUNTER — Ambulatory Visit
Admission: EM | Admit: 2022-06-05 | Discharge: 2022-06-05 | Disposition: A | Discharge status: Home / Self Care | Payer: Medicare Other | Attending: Nurse Practitioner | Admitting: Nurse Practitioner

## 2022-06-05 DIAGNOSIS — R21 Rash and other nonspecific skin eruption: Secondary | ICD-10-CM

## 2022-06-05 DIAGNOSIS — N760 Acute vaginitis: Secondary | ICD-10-CM | POA: Diagnosis not present

## 2022-06-05 MED ORDER — NYSTATIN-TRIAMCINOLONE 100000-0.1 UNIT/GM-% EX CREA
TOPICAL_CREAM | CUTANEOUS | 0 refills | Status: DC
Start: 2022-06-05 — End: 2022-11-24

## 2022-06-05 MED ORDER — PREDNISONE 20 MG PO TABS: 40.0000 mg | ORAL_TABLET | Freq: Every day | ORAL | 0 refills | Status: AC

## 2022-06-05 MED ORDER — DEXAMETHASONE SODIUM PHOSPHATE 10 MG/ML IJ SOLN
10.0000 mg | Freq: Once | INTRAMUSCULAR | Status: AC
  Administered 2022-06-05: 10 mg via INTRAMUSCULAR

## 2022-06-05 MED ORDER — FLUCONAZOLE 150 MG PO TABS: 150.0000 mg | ORAL_TABLET | Freq: Every day | ORAL | 0 refills | Status: DC

## 2022-06-21 ENCOUNTER — Other Ambulatory Visit: Payer: Self-pay | Admitting: Family Medicine

## 2022-06-21 DIAGNOSIS — E119 Type 2 diabetes mellitus without complications: Secondary | ICD-10-CM

## 2022-07-18 ENCOUNTER — Encounter: Payer: Self-pay | Admitting: Family Medicine

## 2022-07-20 ENCOUNTER — Other Ambulatory Visit: Payer: Self-pay | Admitting: Family Medicine

## 2022-07-20 MED ORDER — FLUCONAZOLE 150 MG PO TABS: 150.0000 mg | ORAL_TABLET | Freq: Every day | ORAL | 0 refills | Status: DC

## 2022-07-31 ENCOUNTER — Other Ambulatory Visit (HOSPITAL_COMMUNITY): Payer: Self-pay | Admitting: Family Medicine

## 2022-07-31 DIAGNOSIS — Z1231 Encounter for screening mammogram for malignant neoplasm of breast: Secondary | ICD-10-CM

## 2022-08-06 ENCOUNTER — Encounter: Payer: Self-pay | Admitting: Family Medicine

## 2022-08-10 ENCOUNTER — Ambulatory Visit (HOSPITAL_COMMUNITY)
Admission: RE | Admit: 2022-08-10 | Discharge: 2022-08-10 | Disposition: A | Discharge status: Home / Self Care | Payer: Medicare Other | Source: Ambulatory Visit | Attending: Family Medicine | Admitting: Family Medicine

## 2022-08-10 DIAGNOSIS — Z1231 Encounter for screening mammogram for malignant neoplasm of breast: Secondary | ICD-10-CM | POA: Insufficient documentation

## 2022-08-19 ENCOUNTER — Ambulatory Visit: Payer: Medicare Other | Admitting: Family Medicine

## 2022-08-25 ENCOUNTER — Ambulatory Visit: Payer: Medicare Other | Admitting: Family Medicine

## 2022-08-25 VITALS — BP 150/84 | HR 70 | Temp 97.2°F | Ht 67.0 in | Wt 151.0 lb

## 2022-08-25 DIAGNOSIS — Z23 Encounter for immunization: Secondary | ICD-10-CM | POA: Diagnosis not present

## 2022-08-25 DIAGNOSIS — E119 Type 2 diabetes mellitus without complications: Secondary | ICD-10-CM | POA: Diagnosis not present

## 2022-08-25 DIAGNOSIS — D1722 Benign lipomatous neoplasm of skin and subcutaneous tissue of left arm: Secondary | ICD-10-CM

## 2022-08-25 DIAGNOSIS — I10 Essential (primary) hypertension: Secondary | ICD-10-CM

## 2022-08-25 DIAGNOSIS — N898 Other specified noninflammatory disorders of vagina: Secondary | ICD-10-CM | POA: Diagnosis not present

## 2022-08-25 LAB — POCT URINALYSIS DIP (CLINITEK)
Bilirubin, UA: NEGATIVE
Blood, UA: NEGATIVE
Glucose, UA: >=1000 mg/dL — AB
Ketones, POC UA: NEGATIVE mg/dL
Nitrite, UA: NEGATIVE
POC PROTEIN,UA: NEGATIVE
Spec Grav, UA: <=1.005 — AB (ref 1.010–1.025)
Urobilinogen, UA: 0.2 E.U./dL
pH, UA: 5 (ref 5.0–8.0)

## 2022-08-25 MED ORDER — FLUCONAZOLE 150 MG PO TABS: ORAL_TABLET | ORAL | 3 refills | Status: DC

## 2022-08-25 MED ORDER — GLIPIZIDE 10 MG PO TABS: 10.0000 mg | ORAL_TABLET | Freq: Two times a day (BID) | ORAL | 1 refills | Status: DC

## 2022-08-25 NOTE — Patient Instructions (Signed)
Lab today.  Diflucan as directed for vaginal itching.   Keep an eye on the lipoma.   I will make adjustments to your meds after A1C returns.  Keep an eye on your BP and let me know if it remains elevated (>140/90).  Follow up in 3-6 months.

## 2022-08-26 ENCOUNTER — Encounter: Payer: Self-pay | Admitting: Family Medicine

## 2022-08-26 DIAGNOSIS — N898 Other specified noninflammatory disorders of vagina: Secondary | ICD-10-CM | POA: Insufficient documentation

## 2022-08-26 DIAGNOSIS — D179 Benign lipomatous neoplasm, unspecified: Secondary | ICD-10-CM | POA: Insufficient documentation

## 2022-08-26 LAB — HEMOGLOBIN A1C
Est. average glucose Bld gHb Est-mCnc: 177 mg/dL
Hgb A1c MFr Bld: 7.8 % — ABNORMAL HIGH (ref 4.8–5.6)

## 2022-08-26 NOTE — Assessment & Plan Note (Signed)
Benign. Watchful waiting.

## 2022-08-26 NOTE — Assessment & Plan Note (Signed)
Diflucan as prescribed.

## 2022-08-26 NOTE — Assessment & Plan Note (Signed)
A1c has returned at 7.8.  Unchanged from prior.  Recommend addition of GLP-1.  Continue metformin (may need to decrease dose if diarrhea continues), glipizide, Jardiance.  Referring to nutrition.

## 2022-08-26 NOTE — Assessment & Plan Note (Signed)
BP mildly elevated today.  Continue enalapril.  If continues to be elevated will need to increase dose.

## 2022-08-26 NOTE — Progress Notes (Signed)
Subjective:  Patient ID: Mary Vazquez, female    DOB: [birth year]  Age: 66 y.o. MRN: [id number]  CC: Chief Complaint  Patient presents with   type 2 diabetes    Resend glipizide w / new directions as well as referral to nutritionist  Vaginal itching   Diarrhea    X 1 month , trying to stay hydrated , chest heaviness, fatigued    HPI:  66 year old female with HTN, HLD, DM-2 presents for follow up.  Patient reports she has had ongoing diarrhea for the past month.  Intermittent.  Patient states that she is working on her diet and trying to do better in regards to her sugars.  She is currently on glipizide 10 mg twice daily and metformin 1000 mg twice daily.  She is also on Jardiance 25 mg daily.  She reports occasional hypoglycemia.  Additionally, she states that she is having vaginal itching for the past couple of days.  Also, patient reports that she has an area of concern around her left shoulder.  She would like me to examine this today.  BP mildly elevated today.  She remains on enalapril 20 mg daily.  Reports intermittent ankle swelling.  No swelling currently.  Patient Active Problem List   Diagnosis Date Noted   Lipoma 08/26/2022   Vaginal itching 08/26/2022   Hand pain 05/19/2022   External hemorrhoids 05/19/2022   Ankle swelling 05/19/2022   Hyperlipidemia 11/18/2021   Hypertension 03/02/2013   Diabetes ([place]) 03/02/2013   GERD (gastroesophageal reflux disease) 03/02/2013    Social Hx   Social History   Socioeconomic History   Marital status: Widowed    Spouse name: Not on file   Number of children: 3   Years of education: Not on file   Highest education level: Not on file  Occupational History   Not on file  Tobacco Use   Smoking status: Never   Smokeless tobacco: Never  Vaping Use   Vaping Use: Never used  Substance and Sexual Activity   Alcohol use: No   Drug use: No   Sexual activity: Not Currently    Birth control/protection:  Post-menopausal, Surgical    Comment: tubal  Other Topics Concern   Not on file  Social History Narrative   Husband passed 01/2020.   3 children    2 grandchildren   Social Determinants of Health   Financial Resource Strain: Low Risk  (12/23/2021)   Overall Financial Resource Strain (CARDIA)    Difficulty of Paying Living Expenses: Not hard at all  Food Insecurity: No Food Insecurity (12/23/2021)   Hunger Vital Sign    Worried About Running Out of Food in the Last Year: Never true    Ran Out of Food in the Last Year: Never true  Transportation Needs: No Transportation Needs (12/23/2021)   PRAPARE - Hydrologist (Medical): No    Lack of Transportation (Non-Medical): No  Physical Activity: Sufficiently Active (12/23/2021)   Exercise Vital Sign    Days of Exercise per Week: 5 days    Minutes of Exercise per Session: 50 min  Stress: No Stress Concern Present (12/23/2021)   [place]    Feeling of Stress : Only a little  Social Connections: Moderately Integrated (12/23/2021)   Social Connection and Isolation Panel [NHANES]    Frequency of Communication with Friends and Family: More than three times a week    Frequency  of Social Gatherings with Friends and Family: More than three times a week    Attends Religious Services: More than 4 times per year    Active Member of [employer] or Organizations: Yes    Attends Archivist Meetings: More than 4 times per year    Marital Status: Widowed    Review of Systems Per HPI  Objective:  BP (!) 150/84   Pulse 70   Temp (!) 97.2 F (36.2 C)   Ht '5\' 7"'$  (1.702 m)   Wt 151 lb (68.5 kg)   SpO2 100%   BMI 23.65 kg/m      08/25/2022   10:10 AM 08/25/2022    9:42 AM 08/25/2022    9:34 AM  BP/Weight  Systolic BP 557 322 025  Diastolic BP 84 96 89  Wt. (Lbs)   151  BMI   23.65 kg/m2    Physical Exam Constitutional:      General: She is  not in acute distress.    Appearance: Normal appearance.  HENT:     Head: Normocephalic and atraumatic.  Eyes:     General:        Right eye: No discharge.        Left eye: No discharge.     Conjunctiva/sclera: Conjunctivae normal.  Cardiovascular:     Rate and Rhythm: Normal rate and regular rhythm.  Pulmonary:     Effort: Pulmonary effort is normal.     Breath sounds: Normal breath sounds. No wheezing, rhonchi or rales.  Skin:    Comments: Left posterior shoulder with soft mass consistent with lipoma.  Neurological:     Mental Status: She is alert.  Psychiatric:        Mood and Affect: Mood normal.        Behavior: Behavior normal.     Lab Results  Component Value Date   WBC 4.5 05/19/2022   HGB 15.9 05/19/2022   HCT 47.3 (H) 05/19/2022   PLT 211 05/19/2022   GLUCOSE 120 (H) 05/19/2022   CHOL 156 05/19/2022   TRIG 59 05/19/2022   HDL 76 05/19/2022   LDLCALC 68 05/19/2022   ALT 16 05/19/2022   AST 23 05/19/2022   NA 141 05/19/2022   K 4.3 05/19/2022   CL 103 05/19/2022   CREATININE 0.92 05/19/2022   BUN 15 05/19/2022   CO2 24 05/19/2022   HGBA1C 7.8 (H) 08/25/2022   MICROALBUR 1.22 01/08/2014     Assessment & Plan:   Problem List Items Addressed This Visit       Cardiovascular and Mediastinum   Hypertension    BP mildly elevated today.  Continue enalapril.  If continues to be elevated will need to increase dose.        Endocrine   Diabetes ([place]) - Primary    A1c has returned at 7.8.  Unchanged from prior.  Recommend addition of GLP-1.  Continue metformin (may need to decrease dose if diarrhea continues), glipizide, Jardiance.  Referring to nutrition.      Relevant Medications   glipiZIDE (GLUCOTROL) 10 MG tablet   Other Relevant Orders   Hemoglobin A1c (Completed)   Amb ref to Medical Nutrition Therapy-MNT     Genitourinary   Vaginal itching    Diflucan as prescribed.       Relevant Orders   POCT URINALYSIS DIP (CLINITEK) (Completed)      Other   Lipoma    Benign. Watchful waiting.      Other Visit  Diagnoses     Immunization due       Relevant Orders   Flu Vaccine QUAD High Dose(Fluad) (Completed)       Meds ordered this encounter  Medications   glipiZIDE (GLUCOTROL) 10 MG tablet    Sig: Take 1 tablet (10 mg total) by mouth 2 (two) times daily before a meal. 2 tabs by mouth twice a day    Dispense:  180 tablet    Refill:  1   fluconazole (DIFLUCAN) 150 MG tablet    Sig: Take 1 tablet today.  If you continue to have vaginal symptoms, may take the second tablet in 72 hours.    Dispense:  2 tablet    Refill:  3    Follow-up:  3-6 months.  [place]

## 2022-08-27 ENCOUNTER — Other Ambulatory Visit: Payer: Self-pay

## 2022-08-27 DIAGNOSIS — E119 Type 2 diabetes mellitus without complications: Secondary | ICD-10-CM

## 2022-09-08 ENCOUNTER — Encounter: Payer: Self-pay | Admitting: Nutrition

## 2022-09-08 ENCOUNTER — Encounter: Payer: Medicare Other | Attending: Family Medicine | Admitting: Nutrition

## 2022-09-08 DIAGNOSIS — E782 Mixed hyperlipidemia: Secondary | ICD-10-CM | POA: Diagnosis present

## 2022-09-08 DIAGNOSIS — I10 Essential (primary) hypertension: Secondary | ICD-10-CM | POA: Diagnosis present

## 2022-09-08 DIAGNOSIS — E118 Type 2 diabetes mellitus with unspecified complications: Secondary | ICD-10-CM | POA: Insufficient documentation

## 2022-09-08 NOTE — Patient Instructions (Addendum)
Goals  Increase water to 100 oz per day Time meals as discussed. Focus on more whole food and plant based foods Avoid skipping meals. Keep Exercising. Get A1C down to 6.5% or below

## 2022-09-08 NOTE — Progress Notes (Signed)
Medical Nutrition Therapy  Appointment Start time:  4196  Appointment End time:  61  Primary concerns today: Dm Type 2  Referral diagnosis: E11.8 Preferred learning style: No preference. Learning readiness: Change in progress.    NUTRITION ASSESSMENT  51 y old bfemale here for uncontrolled DM Type 2.Dx 2009.  PMH: Hyperlipidemia and HTN, GERD.  "I just need to find out what I can eat to help bring my blood sugars down." A1C 7.8% down from 8.2% recently. She lost her husband in 2021 and has been under a lot of emotional stress adjusting to her life situation now. Recent changes-increased water intake. Walks daily 3 miles 5 times per week.  Testing blood sugars twice a day. FBS:107-144 mg/dl.      Meds: Metformin 500 mg 4x day, Glipizide and Jardiance  Current diet is inconsistent in food intake contributing to fluctuating blood sugars. Eats 2-3 meals per day, but times are not consistent.  Eats out often due to being hard to cook for just one person.  Goes to grief sessions at her church that have been helpful.  Willing to work with lifestyle medicine to help improve her DM and reduce her medication burden and improve her health.   Anthropometrics  Wt Readings from Last 3 Encounters:  08/25/22 151 lb (68.5 kg)  05/19/22 154 lb (69.9 kg)  12/23/21 151 lb 12.8 oz (68.9 kg)   Ht Readings from Last 3 Encounters:  08/25/22 '5\' 7"'$  (1.702 m)  05/19/22 '5\' 7"'$  (1.702 m)  12/23/21 '5\' 7"'$  (1.702 m)   There is no height or weight on file to calculate BMI. '@BMIFA'$ @ Facility age limit for growth %iles is 20 years. Facility age limit for growth %iles is 20 years.    Clinical Medical Hx: See chart Medications: Metformin 500 mg 2 pills BID. Labs:  Lab Results  Component Value Date   HGBA1C 7.8 (H) 08/25/2022      Latest Ref Rng & Units 05/19/2022    8:56 AM 04/24/2021   10:15 AM 10/18/2020    8:21 AM  CMP  Glucose 70 - 99 mg/dL 120  108  121   BUN 8 - 27 mg/dL '15  17  15    '$ Creatinine 0.57 - 1.00 mg/dL 0.92  0.91  0.88   Sodium 134 - 144 mmol/L 141  141  144   Potassium 3.5 - 5.2 mmol/L 4.3  4.3  4.5   Chloride 96 - 106 mmol/L 103  103  108   CO2 20 - 29 mmol/L '24  22  23   '$ Calcium 8.7 - 10.3 mg/dL 10.0  9.5  9.4   Total Protein 6.0 - 8.5 g/dL 7.3  7.4  6.6   Total Bilirubin 0.0 - 1.2 mg/dL 0.3  0.4  0.3   Alkaline Phos 44 - 121 IU/L 67  66  68   AST 0 - 40 IU/L '23  24  19   '$ ALT 0 - 32 IU/L '16  19  17    '$ Lipid Panel     Component Value Date/Time   CHOL 156 05/19/2022 0856   TRIG 59 05/19/2022 0856   HDL 76 05/19/2022 0856   CHOLHDL 2.1 05/19/2022 0856   CHOLHDL 2.8 01/08/2014 1209   VLDL 17 01/08/2014 1209   LDLCALC 68 05/19/2022 0856   LABVLDL 12 05/19/2022 0856    Notable Signs/Symptoms: None  Lifestyle & Dietary Hx Lives by herself. Eating some at home and eating out more   Estimated daily  fluid intake: 45-60 oz, coffee-sweetner Supplements:  Sleep: poor- 5 hrs Stress / self-care: widow Current average weekly physical activity: walking 3 miles per day.  24-Hr Dietary Recall First Meal: Eggs, fat back and tomatoes,  coffee with creamer Snack: nuts Second Meal: PB sandwich,  water Snack: fruit Third Meal: Greens, cornbread, water Snack: popcorn Beverages: water, creamed coffee   Estimated Energy Needs Calories: 1800 Carbohydrate: 200g Protein: 135g Fat: 50g   NUTRITION DIAGNOSIS  NB-1.1 Food and nutrition-related knowledge deficit As related to Diabetes Type 2.  As evidenced by A1C 7.8%..   NUTRITION INTERVENTION  Nutrition education (E-1) on the following topics:  Nutrition and Diabetes education provided on My Plate, CHO counting, meal planning, portion sizes, timing of meals, avoiding snacks between meals unless having a low blood sugar, target ranges for A1C and blood sugars, signs/symptoms and treatment of hyper/hypoglycemia, monitoring blood sugars, taking medications as prescribed, benefits of exercising 30 minutes per  day and prevention of complications of DM. Nutrition and Diabetes education provided on My Plate, CHO counting, meal planning, portion sizes, timing of meals, avoiding snacks between meals unless having a low blood sugar, target ranges for A1C and blood sugars, signs/symptoms and treatment of hyper/hypoglycemia, monitoring blood sugars, taking medications as prescribed, benefits of exercising 30 minutes per day and prevention of complications of DM.   Handouts Provided Include  Lifestyle Medicine Log sheets  Learning Style & Readiness for Change Teaching method utilized: Visual & Auditory  Demonstrated degree of understanding via: Teach Back  Barriers to learning/adherence to lifestyle change: None  Goals Established by Pt Goals  Increase water to 100 oz per day Time meals as discussed. Focus on more whole food and plant based foods Avoid skipping meals. Keep Exercising. Get A1C down to 6.5% or below   MONITORING & EVALUATION Dietary intake, weekly physical activity, and blood sugars in 1 month.  Next Steps  Patient is to work on meal planning and eating meals on time.[place]

## 2022-09-13 ENCOUNTER — Other Ambulatory Visit: Payer: Self-pay | Admitting: Family Medicine

## 2022-09-28 ENCOUNTER — Encounter: Payer: Self-pay | Admitting: Family Medicine

## 2022-10-05 ENCOUNTER — Encounter: Payer: Self-pay | Admitting: Nurse Practitioner

## 2022-10-05 ENCOUNTER — Ambulatory Visit: Payer: Medicare Other | Admitting: Nurse Practitioner

## 2022-10-05 VITALS — BP 130/90 | HR 59 | Ht 67.0 in | Wt 156.8 lb

## 2022-10-05 DIAGNOSIS — E119 Type 2 diabetes mellitus without complications: Secondary | ICD-10-CM | POA: Diagnosis not present

## 2022-10-05 DIAGNOSIS — E1122 Type 2 diabetes mellitus with diabetic chronic kidney disease: Secondary | ICD-10-CM

## 2022-10-05 DIAGNOSIS — N182 Chronic kidney disease, stage 2 (mild): Secondary | ICD-10-CM | POA: Diagnosis not present

## 2022-10-05 LAB — POCT UA - MICROALBUMIN
Creatinine, POC: 50 mg/dL
Microalbumin Ur, POC: 30 mg/L

## 2022-10-05 MED ORDER — GLIPIZIDE 10 MG PO TABS: 5.0000 mg | ORAL_TABLET | Freq: Two times a day (BID) | ORAL | 1 refills | Status: DC

## 2022-10-05 NOTE — Patient Instructions (Signed)

## 2022-10-05 NOTE — Progress Notes (Signed)
Endocrinology Consult Note       10/06/2022, 7:51 AM   Subjective:    Patient ID: Mary Vazquez, female    DOB: 17-Mar-1956.  Mary Vazquez is being seen in consultation for management of currently uncontrolled symptomatic diabetes requested by  Coral Spikes, DO.   Past Medical History:  Diagnosis Date   Diabetes ([place])    Hypertension    PONV (postoperative nausea and vomiting)     Past Surgical History:  Procedure Laterality Date   COLONOSCOPY   08/08/2007   SLF:  A 5-mm sessile descending colon polyp removed via cold forceps.Normal retroflexed view of the rectum/ Small internal hemorrhoids, PATH: tubular adenoma   COLONOSCOPY  01/09/2013   Procedure: COLONOSCOPY;  Surgeon: Danie Binder, MD;  Location: [facility];  Service: Endoscopy;  Laterality: N/A;  8:30   TUBAL LIGATION      Social History   Socioeconomic History   Marital status: Widowed    Spouse name: Not on file   Number of children: 3   Years of education: Not on file   Highest education level: Not on file  Occupational History   Not on file  Tobacco Use   Smoking status: Never   Smokeless tobacco: Never  Vaping Use   Vaping Use: Never used  Substance and Sexual Activity   Alcohol use: No   Drug use: No   Sexual activity: Not Currently    Birth control/protection: Post-menopausal, Surgical    Comment: tubal  Other Topics Concern   Not on file  Social History Narrative   Husband passed 01/2020.   3 children    2 grandchildren   Social Determinants of Health   Financial Resource Strain: Low Risk  (12/23/2021)   Overall Financial Resource Strain (CARDIA)    Difficulty of Paying Living Expenses: Not hard at all  Food Insecurity: No Food Insecurity (12/23/2021)   Hunger Vital Sign    Worried About Running Out of Food in the Last Year: Never true    Ran Out of Food in the Last Year: Never true  Transportation  Needs: No Transportation Needs (12/23/2021)   PRAPARE - Hydrologist (Medical): No    Lack of Transportation (Non-Medical): No  Physical Activity: Sufficiently Active (12/23/2021)   Exercise Vital Sign    Days of Exercise per Week: 5 days    Minutes of Exercise per Session: 50 min  Stress: No Stress Concern Present (12/23/2021)   [place]    Feeling of Stress : Only a little  Social Connections: Moderately Integrated (12/23/2021)   Social Connection and Isolation Panel [NHANES]    Frequency of Communication with Friends and Family: More than three times a week    Frequency of Social Gatherings with Friends and Family: More than three times a week    Attends Religious Services: More than 4 times per year    Active Member of [employer] or Organizations: Yes    Attends Archivist Meetings: More than 4 times per year    Marital Status: Widowed    Family History  Problem Relation Age of  Onset   Cancer Father        prostate   Diabetes Sister    Diabetes Maternal Grandmother    Diabetes Paternal Aunt    Colon cancer Neg Hx     Outpatient Encounter Medications as of 10/05/2022  Medication Sig   atorvastatin (LIPITOR) 20 MG tablet TAKE 1 TABLET BY MOUTH EVERY DAY   enalapril (VASOTEC) 20 MG tablet Take 1 tablet (20 mg total) by mouth daily.   hydrocortisone (ANUSOL-HC) 2.5 % rectal cream Place 1 application. rectally 2 (two) times daily.   metFORMIN (GLUCOPHAGE) 500 MG tablet TAKE 2 TABLETS IN THE MORNING AND 2 TABLETS IN THE EVENING   nystatin-triamcinolone (MYCOLOG II) cream Apply to affected area daily   Propylene Glycol 0.6 % SOLN Apply 1 drop to eye daily.   [DISCONTINUED] empagliflozin (JARDIANCE) 25 MG TABS tablet Take 1 tablet (25 mg total) by mouth daily.   [DISCONTINUED] glipiZIDE (GLUCOTROL) 10 MG tablet Take 1 tablet (10 mg total) by mouth 2 (two) times daily before a meal. 2  tabs by mouth twice a day   fluconazole (DIFLUCAN) 150 MG tablet Take 1 tablet today.  If you continue to have vaginal symptoms, may take the second tablet in 72 hours. (Patient not taking: Reported on 09/08/2022)   glipiZIDE (GLUCOTROL) 10 MG tablet Take 0.5 tablets (5 mg total) by mouth 2 (two) times daily before a meal. 2 tabs by mouth twice a day   naproxen (NAPROSYN) 375 MG tablet TAKE 1 TABLET (375 MG TOTAL) BY MOUTH 2 (TWO) TIMES DAILY AS NEEDED FOR MODERATE PAIN OR MILD PAIN. (Patient not taking: Reported on 09/08/2022)   No facility-administered encounter medications on file as of 10/05/2022.    ALLERGIES: Allergies  Allergen Reactions   Ozempic (0.25 Or 0.5 Mg-Dose) [Semaglutide(0.25 Or 0.'5mg'$ -Dos)] Nausea And Vomiting   Septra [Sulfamethoxazole-Trimethoprim]     VACCINATION STATUS: Immunization History  Administered Date(s) Administered   Fluad Quad(high Dose 65+) 10/09/2020, 10/07/2021, 08/25/2022   Influenza, High Dose Seasonal PF 10/23/2019   Influenza,inj,Quad PF,6+ Mos 08/23/2014, 11/18/2015, 11/01/2017, 10/18/2018   Influenza-Unspecified 10/01/2016, 10/18/2018   Moderna SARS-COV2 Booster Vaccination 10/30/2020, 06/10/2021   Moderna Sars-Covid-2 Vaccination 03/24/2020, 04/22/2020   Td 06/29/2016    Diabetes She presents for her initial diabetic visit. She has type 2 diabetes mellitus. Onset time: Diagnosed at approx age of 16 (also had Gest DM) Hypoglycemia symptoms include tremors. There are no diabetic associated symptoms. There are no hypoglycemic complications. There are no diabetic complications. Risk factors for coronary artery disease include diabetes mellitus and family history. Current diabetic treatment includes oral agent (triple therapy). She is compliant with treatment most of the time. Her weight is stable. She is following a generally healthy diet. She has had a previous visit with a dietitian. She participates in exercise daily. Her breakfast blood glucose  range is generally 110-130 mg/dl. (She presents today for her consultation with her logs showing at goal fasting glycemic profile overall ranging between 103-146 with occasional nocturnal hypoglycemia.  Her most recent A1c on 08/25/22 was 7.8%.  She monitors glucose only once daily and as needed if she experiences symptoms.  She drinks mostly water, coffee (with sweetener), and occasionally a soda with meals.  She admits she does not have a routine eating pattern.  She is physically active, walking 3 miles 5 days a week rain or shine.  She is UTD on eye exam, has never seen podiatry in the past.  She notes she has an allergy  to Ozempic with only one dose making her extremely nauseous and vomiting.  ) An ACE inhibitor/angiotensin II receptor blocker is being taken. She does not see a podiatrist.Eye exam is current.    Review of systems  Constitutional: + Minimally fluctuating body weight,  current Body mass index is 24.56 kg/m. , no fatigue, no subjective hyperthermia, no subjective hypothermia Eyes: no blurry vision, no xerophthalmia ENT: no sore throat, no nodules palpated in throat, no dysphagia/odynophagia, no hoarseness Cardiovascular: no chest pain, no shortness of breath, no palpitations, no leg swelling Respiratory: no cough, no shortness of breath Gastrointestinal: no nausea/vomiting/diarrhea Musculoskeletal: no muscle/joint aches Skin: no rashes, no hyperemia Neurological: no tremors, no numbness, no tingling, no dizziness Psychiatric: no depression, no anxiety  Objective:     BP (!) 130/90 (BP Location: Right Arm, Patient Position: Sitting, Cuff Size: Normal)   Pulse (!) 59   Ht '5\' 7"'$  (1.702 m)   Wt 156 lb 12.8 oz (71.1 kg)   BMI 24.56 kg/m   Wt Readings from Last 3 Encounters:  10/05/22 156 lb 12.8 oz (71.1 kg)  08/25/22 151 lb (68.5 kg)  05/19/22 154 lb (69.9 kg)     BP Readings from Last 3 Encounters:  10/05/22 (!) 130/90  08/25/22 (!) 150/84  06/05/22 (!) 141/84      Physical Exam- Limited  Constitutional:  Body mass index is 24.56 kg/m. , not in acute distress, normal state of mind Eyes:  EOMI, no exophthalmos Neck: Supple Cardiovascular: RRR, no murmurs, rubs, or gallops, no edema Respiratory: Adequate breathing efforts, no crackles, rales, rhonchi, or wheezing Musculoskeletal: no gross deformities, strength intact in all four extremities, no gross restriction of joint movements Skin:  no rashes, no hyperemia Neurological: no tremor with outstretched hands   Diabetic Foot Exam - Simple   Simple Foot Form Visual Inspection No deformities, no ulcerations, no other skin breakdown bilaterally: Yes Sensation Testing Intact to touch and monofilament testing bilaterally: Yes Pulse Check Posterior Tibialis and Dorsalis pulse intact bilaterally: Yes Comments     CMP ( most recent) CMP     Component Value Date/Time   NA 141 05/19/2022 0856   K 4.3 05/19/2022 0856   CL 103 05/19/2022 0856   CO2 24 05/19/2022 0856   GLUCOSE 120 (H) 05/19/2022 0856   GLUCOSE 128 (H) 01/08/2014 1209   BUN 15 05/19/2022 0856   CREATININE 0.92 05/19/2022 0856   CREATININE 0.91 01/08/2014 1209   CALCIUM 10.0 05/19/2022 0856   PROT 7.3 05/19/2022 0856   ALBUMIN 4.6 05/19/2022 0856   AST 23 05/19/2022 0856   ALT 16 05/19/2022 0856   ALKPHOS 67 05/19/2022 0856   BILITOT 0.3 05/19/2022 0856   GFRNONAA 69 10/18/2020 0821   GFRAA 79 10/18/2020 0821     Diabetic Labs (most recent): Lab Results  Component Value Date   HGBA1C 7.8 (H) 08/25/2022   HGBA1C 7.8 (H) 05/19/2022   HGBA1C 8.2 (H) 11/18/2021   MICROALBUR 30 mg/L 10/05/2022   MICROALBUR 1.22 01/08/2014     Lipid Panel ( most recent) Lipid Panel     Component Value Date/Time   CHOL 156 05/19/2022 0856   TRIG 59 05/19/2022 0856   HDL 76 05/19/2022 0856   CHOLHDL 2.1 05/19/2022 0856   CHOLHDL 2.8 01/08/2014 1209   VLDL 17 01/08/2014 1209   LDLCALC 68 05/19/2022 0856   LABVLDL 12 05/19/2022  0856      No results found for: "TSH", "FREET4"  Assessment & Plan:   1) Type 2 diabetes mellitus with stage 2 chronic kidney disease, without long-term current use of insulin ([place])  She presents today for her consultation with her logs showing at goal fasting glycemic profile overall ranging between 103-146 with occasional nocturnal hypoglycemia.  Her most recent A1c on 08/25/22 was 7.8%.  She monitors glucose only once daily and as needed if she experiences symptoms.  She drinks mostly water, coffee (with sweetener), and occasionally a soda with meals.  She admits she does not have a routine eating pattern.  She is physically active, walking 3 miles 5 days a week rain or shine.  She is UTD on eye exam, has never seen podiatry in the past.  She notes she has an allergy to Ozempic with only one dose making her extremely nauseous and vomiting.    - Mary Vazquez has currently uncontrolled symptomatic type 2 DM since 66 years of age, with most recent A1c of 7.8 %.   -Recent labs reviewed.  - I had a long discussion with her about the progressive nature of diabetes and the pathology behind its complications. -her diabetes is not currently complicated but she remains at a high risk for more acute and chronic complications which include CAD, CVA, CKD, retinopathy, and neuropathy. These are all discussed in detail with her.  The following Lifestyle Medicine recommendations according to [facility]) were discussed and offered to patient and she agrees to start the journey:  A. Whole Foods, Plant-based plate comprising of fruits and vegetables, plant-based proteins, whole-grain carbohydrates was discussed in detail with the patient.   A list for source of those nutrients were also provided to the patient.  Patient will use only water or unsweetened tea for hydration. B.  The need to stay away from risky substances including alcohol, smoking; obtaining 7  to 9 hours of restorative sleep, at least 150 minutes of moderate intensity exercise weekly, the importance of healthy social connections,  and stress reduction techniques were discussed. C.  A full color page of  Calorie density of various food groups per pound showing examples of each food groups was provided to the patient.  - I have counseled her on diet and weight management by adopting a carbohydrate restricted/protein rich diet. Patient is encouraged to switch to unprocessed or minimally processed complex starch and increased protein intake (animal or plant source), fruits, and vegetables. -  she is advised to stick to a routine mealtimes to eat 3 meals a day and avoid unnecessary snacks (to snack only to correct hypoglycemia).   - she acknowledges that there is a room for improvement in her food and drink choices. - Suggestion is made for her to avoid simple carbohydrates from her diet including Cakes, Sweet Desserts, Ice Cream, Soda (diet and regular), Sweet Tea, Candies, Chips, Cookies, Store Bought Juices, Alcohol in Excess of 1-2 drinks a day, Artificial Sweeteners, Coffee Creamer, and "Sugar-free" Products. This will help patient to have more stable blood glucose profile and potentially avoid unintended weight gain.  - she has already been set up with Mary Vazquez, RDN, CDE for diabetes education.  - I have approached her with the following individualized plan to manage her diabetes and patient agrees:   -she is encouraged to start monitoring glucose twice daily, before breakfast and before bed, to log their readings on the clinic sheets provided, and bring them to review at follow up appointment in 4 weeks.  - Adjustment parameters  are given to her for hypo and hyperglycemia in writing. - she is encouraged to call clinic for blood glucose levels less than 70 or above 300 mg /dl. - she is advised to continue Metformin 1000 mg po twice daily with meals and lower her Glipizide to 5 mg  po twice daily, therapeutically suitable for patient. - her Vania Rea will be discontinued, risk outweighs benefit for this patient.  She notes significant vaginal yeast with this medication, requiring prescriptions to alleviate.  - she is not an ideal candidate for incretin therapy due to body habitus and BMI less than 25.  - Specific targets for  A1c; LDL, HDL, and Triglycerides were discussed with the patient.  2) Blood Pressure /Hypertension:  her blood pressure is controlled to target.   she is advised to continue her current medications including Enalapril 20 mg p.o. daily with breakfast.  3) Lipids/Hyperlipidemia:    Review of her recent lipid panel from 05/19/22 showed controlled LDL at 68 .  she is advised to continue Lipitor 20 mg daily at bedtime.  Side effects and precautions discussed with her.  4)  Weight/Diet:  her Body mass index is 24.56 kg/m.  -   she is NOT a candidate for weight loss.  Exercise, and detailed carbohydrates information provided  -  detailed on discharge instructions.  5) Chronic Care/Health Maintenance: -she is on ACEI/ARB and Statin medications and is encouraged to initiate and continue to follow up with Ophthalmology, Dentist, Podiatrist at least yearly or according to recommendations, and advised to stay away from smoking. I have recommended yearly flu vaccine and pneumonia vaccine at least every 5 years; moderate intensity exercise for up to 150 minutes weekly; and sleep for at least 7 hours a day.  - she is advised to maintain close follow up with Coral Spikes, DO for primary care needs, as well as her other providers for optimal and coordinated care.   - Time spent in this patient care: 60 min, of which > 50% was spent in counseling her about her diabetes and the rest reviewing her blood glucose logs, discussing her hypoglycemia and hyperglycemia episodes, reviewing her current and previous labs/studies (including abstraction from other facilities) and  medications doses and developing a long term treatment plan based on the latest standards of care/guidelines; and documenting her care.    Please refer to Patient Instructions for Blood Glucose Monitoring and Insulin/Medications Dosing Guide" in media tab for additional information. Please also refer to "Patient Self Inventory" in the Media tab for reviewed elements of pertinent patient history.  Mary Vazquez participated in the discussions, expressed understanding, and voiced agreement with the above plans.  All questions were answered to her satisfaction. she is encouraged to contact clinic should she have any questions or concerns prior to her return visit.     Follow up plan: - Return in about 1 month (around 11/05/2022) for Diabetes F/U, Bring meter and logs.    Rayetta Pigg, [facility] [address] [place], [place] [address] Phone: [phone] Fax: [phone]  10/06/2022, 7:51 AM

## 2022-10-07 ENCOUNTER — Ambulatory Visit: Payer: Medicare Other | Admitting: Nutrition

## 2022-10-12 ENCOUNTER — Other Ambulatory Visit: Payer: Self-pay | Admitting: Family Medicine

## 2022-10-12 DIAGNOSIS — E119 Type 2 diabetes mellitus without complications: Secondary | ICD-10-CM

## 2022-10-12 DIAGNOSIS — I10 Essential (primary) hypertension: Secondary | ICD-10-CM

## 2022-11-10 NOTE — Patient Instructions (Incomplete)

## 2022-11-11 ENCOUNTER — Ambulatory Visit: Payer: Medicare Other | Admitting: Nutrition

## 2022-11-11 ENCOUNTER — Ambulatory Visit: Payer: Medicare Other | Admitting: Nurse Practitioner

## 2022-11-23 ENCOUNTER — Encounter: Payer: Self-pay | Admitting: Family Medicine

## 2022-11-24 ENCOUNTER — Ambulatory Visit: Payer: Medicare Other | Admitting: Family Medicine

## 2022-11-24 VITALS — BP 140/92 | Ht 67.0 in | Wt 154.6 lb

## 2022-11-24 DIAGNOSIS — I10 Essential (primary) hypertension: Secondary | ICD-10-CM

## 2022-11-24 DIAGNOSIS — E782 Mixed hyperlipidemia: Secondary | ICD-10-CM

## 2022-11-24 DIAGNOSIS — S46819A Strain of other muscles, fascia and tendons at shoulder and upper arm level, unspecified arm, initial encounter: Secondary | ICD-10-CM | POA: Insufficient documentation

## 2022-11-24 DIAGNOSIS — Z23 Encounter for immunization: Secondary | ICD-10-CM

## 2022-11-24 DIAGNOSIS — E119 Type 2 diabetes mellitus without complications: Secondary | ICD-10-CM | POA: Diagnosis not present

## 2022-11-24 DIAGNOSIS — S46811A Strain of other muscles, fascia and tendons at shoulder and upper arm level, right arm, initial encounter: Secondary | ICD-10-CM | POA: Diagnosis not present

## 2022-11-24 MED ORDER — ENALAPRIL MALEATE 20 MG PO TABS: 20.0000 mg | ORAL_TABLET | Freq: Every day | ORAL | 6 refills | Status: DC

## 2022-11-24 MED ORDER — METFORMIN HCL 500 MG PO TABS: 1000.0000 mg | ORAL_TABLET | Freq: Two times a day (BID) | ORAL | 6 refills | Status: DC

## 2022-11-24 MED ORDER — GLIPIZIDE 5 MG PO TABS: 5.0000 mg | ORAL_TABLET | Freq: Two times a day (BID) | ORAL | 6 refills | Status: DC

## 2022-11-24 MED ORDER — ATORVASTATIN CALCIUM 20 MG PO TABS: 20.0000 mg | ORAL_TABLET | Freq: Every day | ORAL | 6 refills | Status: DC

## 2022-11-24 NOTE — Assessment & Plan Note (Signed)
Continue enalapril.  BP slightly elevated today.  Will continue to monitor closely.

## 2022-11-24 NOTE — Assessment & Plan Note (Signed)
A1c today.  Also obtaining urine microalbumin. Continue glipizide and metformin.

## 2022-11-24 NOTE — Patient Instructions (Signed)
I have refilled the medications (30 day supplies with refills).  A1C and urine today.  Continue your medications.  Follow up in 3-6 months.

## 2022-11-24 NOTE — Progress Notes (Signed)
Subjective:  Patient ID: Mary Vazquez, female    DOB: Feb 17, 1956  Age: 66 y.o. MRN: [id number]  CC: Chief Complaint  Patient presents with   Diabetes    Follow up Right shoulder and hand pain since last visit   HPI:  67 year female with DM-2, Hypertension, HLD presents for follow up.   BP mildly elevated here. Compliant with enalapril.  Lipids have been very well-controlled on atorvastatin.  Tolerating well.  Most recent A1c 7.8.  Has now seen endocrinology.  Vania Rea has been discontinued.  Patient reports that her most recent blood sugar reading was in the 150s.  She is currently on glipizide and metformin.  No hypoglycemia.  Patient reports that she has developed right shoulder pain.  She localizes the pain to the right side of the upper neck and to the upper back (trapezius).  Also reports hand pain.  She has had this previously.  Likely secondary to osteoarthritis.   Patient Active Problem List   Diagnosis Date Noted   Trapezius muscle strain 11/24/2022   Lipoma 08/26/2022   Hand pain 05/19/2022   Hyperlipidemia 11/18/2021   Hypertension 03/02/2013   Diabetes ([place]) 03/02/2013   GERD (gastroesophageal reflux disease) 03/02/2013    Social Hx   Social History   Socioeconomic History   Marital status: Widowed    Spouse name: Not on file   Number of children: 3   Years of education: Not on file   Highest education level: Not on file  Occupational History   Not on file  Tobacco Use   Smoking status: Never   Smokeless tobacco: Never  Vaping Use   Vaping Use: Never used  Substance and Sexual Activity   Alcohol use: No   Drug use: No   Sexual activity: Not Currently    Birth control/protection: Post-menopausal, Surgical    Comment: tubal  Other Topics Concern   Not on file  Social History Narrative   Husband passed 01/2020.   3 children    2 grandchildren   Social Determinants of Health   Financial Resource Strain: Low Risk  (12/23/2021)   Overall  Financial Resource Strain (CARDIA)    Difficulty of Paying Living Expenses: Not hard at all  Food Insecurity: No Food Insecurity (12/23/2021)   Hunger Vital Sign    Worried About Running Out of Food in the Last Year: Never true    Ran Out of Food in the Last Year: Never true  Transportation Needs: No Transportation Needs (12/23/2021)   PRAPARE - Hydrologist (Medical): No    Lack of Transportation (Non-Medical): No  Physical Activity: Sufficiently Active (12/23/2021)   Exercise Vital Sign    Days of Exercise per Week: 5 days    Minutes of Exercise per Session: 50 min  Stress: No Stress Concern Present (12/23/2021)   [place]    Feeling of Stress : Only a little  Social Connections: Moderately Integrated (12/23/2021)   Social Connection and Isolation Panel [NHANES]    Frequency of Communication with Friends and Family: More than three times a week    Frequency of Social Gatherings with Friends and Family: More than three times a week    Attends Religious Services: More than 4 times per year    Active Member of [employer] or Organizations: Yes    Attends Archivist Meetings: More than 4 times per year    Marital Status: Widowed  Review of Systems Per HPI  Objective:  BP (!) 140/92   Ht '5\' 7"'$  (1.702 m)   Wt 154 lb 9.6 oz (70.1 kg)   BMI 24.21 kg/m      11/24/2022    9:37 AM 11/24/2022    9:34 AM 10/05/2022    1:29 PM  BP/Weight  Systolic BP 419 379 024  Diastolic BP 92 93 90  Wt. (Lbs)  154.6 156.8  BMI  24.21 kg/m2 24.56 kg/m2    Physical Exam Constitutional:      General: She is not in acute distress.    Appearance: Normal appearance.  HENT:     Head: Normocephalic and atraumatic.  Eyes:     General:        Right eye: No discharge.        Left eye: No discharge.     Conjunctiva/sclera: Conjunctivae normal.  Cardiovascular:     Rate and Rhythm: Normal rate and  regular rhythm.  Pulmonary:     Effort: Pulmonary effort is normal.     Breath sounds: Normal breath sounds. No wheezing, rhonchi or rales.  Musculoskeletal:       Arms:     Comments: Tenderness at the labeled location.  Neurological:     Mental Status: She is alert.  Psychiatric:        Mood and Affect: Mood normal.        Behavior: Behavior normal.   Diabetic Foot Check -  Appearance - no lesions, ulcers or calluses Skin - no unusual pallor or redness Monofilament testing -  Right - Great toe, medial, central, lateral ball and posterior foot intact Left - Great toe, medial, central, lateral ball and posterior foot intact   Lab Results  Component Value Date   WBC 4.5 05/19/2022   HGB 15.9 05/19/2022   HCT 47.3 (H) 05/19/2022   PLT 211 05/19/2022   GLUCOSE 120 (H) 05/19/2022   CHOL 156 05/19/2022   TRIG 59 05/19/2022   HDL 76 05/19/2022   LDLCALC 68 05/19/2022   ALT 16 05/19/2022   AST 23 05/19/2022   NA 141 05/19/2022   K 4.3 05/19/2022   CL 103 05/19/2022   CREATININE 0.92 05/19/2022   BUN 15 05/19/2022   CO2 24 05/19/2022   HGBA1C 7.8 (H) 08/25/2022   MICROALBUR 30 mg/L 10/05/2022     Assessment & Plan:   Problem List Items Addressed This Visit       Cardiovascular and Mediastinum   Hypertension    Continue enalapril.  BP slightly elevated today.  Will continue to monitor closely.      Relevant Medications   atorvastatin (LIPITOR) 20 MG tablet   enalapril (VASOTEC) 20 MG tablet     Endocrine   Diabetes ([place]) - Primary    A1c today.  Also obtaining urine microalbumin. Continue glipizide and metformin.      Relevant Medications   atorvastatin (LIPITOR) 20 MG tablet   enalapril (VASOTEC) 20 MG tablet   glipiZIDE (GLUCOTROL) 5 MG tablet   metFORMIN (GLUCOPHAGE) 500 MG tablet   Other Relevant Orders   Hemoglobin A1c   Microalbumin / creatinine urine ratio     Musculoskeletal and Integument   Trapezius muscle strain    Heat.  Supportive  care.        Other   Hyperlipidemia    Well-controlled on statin.  Continue.      Relevant Medications   atorvastatin (LIPITOR) 20 MG tablet   enalapril (VASOTEC) 20  MG tablet   Other Visit Diagnoses     Need for vaccination       Relevant Orders   Pneumococcal conjugate vaccine 20-valent (Completed)       Meds ordered this encounter  Medications   atorvastatin (LIPITOR) 20 MG tablet    Sig: Take 1 tablet (20 mg total) by mouth daily.    Dispense:  30 tablet    Refill:  6   enalapril (VASOTEC) 20 MG tablet    Sig: Take 1 tablet (20 mg total) by mouth daily.    Dispense:  30 tablet    Refill:  6   glipiZIDE (GLUCOTROL) 5 MG tablet    Sig: Take 1 tablet (5 mg total) by mouth 2 (two) times daily before a meal.    Dispense:  60 tablet    Refill:  6   metFORMIN (GLUCOPHAGE) 500 MG tablet    Sig: Take 2 tablets (1,000 mg total) by mouth 2 (two) times daily with a meal.    Dispense:  120 tablet    Refill:  6    Follow-up:  3-6 months  [place]

## 2022-11-24 NOTE — Assessment & Plan Note (Signed)
Heat.  Supportive care.

## 2022-11-24 NOTE — Assessment & Plan Note (Signed)
Well-controlled on statin.  Continue. 

## 2022-11-25 ENCOUNTER — Encounter: Payer: Self-pay | Admitting: *Deleted

## 2022-11-25 LAB — HEMOGLOBIN A1C
Est. average glucose Bld gHb Est-mCnc: 183 mg/dL
Hgb A1c MFr Bld: 8 % — ABNORMAL HIGH (ref 4.8–5.6)

## 2022-11-25 LAB — MICROALBUMIN / CREATININE URINE RATIO
Creatinine, Urine: 36.5 mg/dL
Microalb/Creat Ratio: <8 mg/g creat (ref 0–29)
Microalbumin, Urine: <3 ug/mL

## 2022-12-23 ENCOUNTER — Other Ambulatory Visit: Payer: Self-pay

## 2022-12-23 ENCOUNTER — Ambulatory Visit
Admission: EM | Admit: 2022-12-23 | Discharge: 2022-12-23 | Disposition: A | Discharge status: Home / Self Care | Payer: Medicare Other

## 2022-12-23 ENCOUNTER — Encounter: Payer: Self-pay | Admitting: Emergency Medicine

## 2022-12-23 DIAGNOSIS — R059 Cough, unspecified: Secondary | ICD-10-CM

## 2022-12-23 MED ORDER — CETIRIZINE HCL 10 MG PO TABS: 10.0000 mg | ORAL_TABLET | Freq: Every day | ORAL | 0 refills | Status: DC

## 2022-12-23 MED ORDER — GUAIFENESIN 100 MG/5ML PO LIQD: 5.0000 mL | Freq: Four times a day (QID) | ORAL | 0 refills | Status: DC | PRN

## 2022-12-23 NOTE — ED Triage Notes (Signed)
Pt reports cough, chest congestion, sore throat since christmas. Denies any known fevers, emesis, diarrhea.

## 2022-12-23 NOTE — ED Provider Notes (Signed)
[facility]    CSN: [id number] Arrival date & time: 12/23/22  1357      History   Chief Complaint Chief Complaint  Patient presents with   Cough    HPI Mary Vazquez is a 67 y.o. female.   The history is provided by the patient.   Patient presents for cough, sneezing, chest congestion, and sore throat send present over the last several weeks.  Patient states prior to her symptoms starting, she was moving some items and caused some dust to begin moving.  She states after that, her symptoms started.  She states the cough and sneezing have improved, but she was concerned because she feels like this is "stuck in her chest".  She denies fever, chills, headache, ear pain, chest pain, wheezing, shortness of breath, difficulty breathing, or GI symptoms.  She reports that she took Alka-Seltzer a few times since her symptoms started, but has not taken it consistently.  She reports "I am not always sure what I need to take, but I try not to take a lot of medicine".    Past Medical History:  Diagnosis Date   Diabetes ([place])    Hypertension    PONV (postoperative nausea and vomiting)     Patient Active Problem List   Diagnosis Date Noted   Trapezius muscle strain 11/24/2022   Lipoma 08/26/2022   Hand pain 05/19/2022   Hyperlipidemia 11/18/2021   Hypertension 03/02/2013   Diabetes ([place]) 03/02/2013   GERD (gastroesophageal reflux disease) 03/02/2013    Past Surgical History:  Procedure Laterality Date   COLONOSCOPY   08/08/2007   SLF:  A 5-mm sessile descending colon polyp removed via cold forceps.Normal retroflexed view of the rectum/ Small internal hemorrhoids, PATH: tubular adenoma   COLONOSCOPY  01/09/2013   Procedure: COLONOSCOPY;  Surgeon: Danie Binder, MD;  Location: [facility];  Service: Endoscopy;  Laterality: N/A;  8:30   TUBAL LIGATION      OB History     Gravida  3   Para  3   Term  3   Preterm      AB      Living         SAB       IAB      Ectopic      Multiple      Live Births               Home Medications    Prior to Admission medications   Medication Sig Start Date End Date Taking? Authorizing Provider  cetirizine (ZYRTEC) 10 MG tablet Take 1 tablet (10 mg total) by mouth daily. 12/23/22  Yes Ayva Veilleux-Warren, Alda Lea, NP  guaiFENesin (ROBITUSSIN) 100 MG/5ML liquid Take 5 mLs by mouth every 6 (six) hours as needed for up to 10 days for cough or to loosen phlegm. 12/23/22 01/02/23 Yes Areen Trautner-Warren, Alda Lea, NP  Magnesium Glycinate 100 MG CAPS Take 100 mg by mouth daily.   Yes [provider]  atorvastatin (LIPITOR) 20 MG tablet Take 1 tablet (20 mg total) by mouth daily. 11/24/22   Coral Spikes, DO  enalapril (VASOTEC) 20 MG tablet Take 1 tablet (20 mg total) by mouth daily. 11/24/22   Coral Spikes, DO  glipiZIDE (GLUCOTROL) 5 MG tablet Take 1 tablet (5 mg total) by mouth 2 (two) times daily before a meal. 11/24/22   Cook, Michael Boston G, DO  metFORMIN (GLUCOPHAGE) 500 MG tablet Take 2 tablets (1,000 mg total)  by mouth 2 (two) times daily with a meal. 11/24/22 12/24/22  Coral Spikes, DO    Family History Family History  Problem Relation Age of Onset   Cancer Father        prostate   Diabetes Sister    Diabetes Maternal Grandmother    Diabetes Paternal Aunt    Colon cancer Neg Hx     Social History Social History   Tobacco Use   Smoking status: Never   Smokeless tobacco: Never  Vaping Use   Vaping Use: Never used  Substance Use Topics   Alcohol use: No   Drug use: No     Allergies   Ozempic (0.25 or 0.5 mg-dose) [semaglutide(0.25 or 0.'5mg'$ -dos)] and Septra [sulfamethoxazole-trimethoprim]   Review of Systems Review of Systems Per HPI  Physical Exam Triage Vital Signs ED Triage Vitals  Enc Vitals Group     BP 12/23/22 1423 (!) 161/87     Pulse Rate 12/23/22 1423 69     Resp 12/23/22 1423 20     Temp 12/23/22 1423 98 F (36.7 C)     Temp Source 12/23/22 1423 Oral     SpO2  12/23/22 1423 98 %     Weight --      Height --      Head Circumference --      Peak Flow --      Pain Score 12/23/22 1421 0     Pain Loc --      Pain Edu? --      Excl. in [place]? --    No data found.  Updated Vital Signs BP (!) 161/87 (BP Location: Right Arm)   Pulse 69   Temp 98 F (36.7 C) (Oral)   Resp 20   SpO2 98%   Visual Acuity Right Eye Distance:   Left Eye Distance:   Bilateral Distance:    Right Eye Near:   Left Eye Near:    Bilateral Near:     Physical Exam Vitals and nursing note reviewed.  Constitutional:      General: She is not in acute distress.    Appearance: Normal appearance.  HENT:     Head: Normocephalic.     Right Ear: Tympanic membrane, ear canal and external ear normal.     Left Ear: Tympanic membrane, ear canal and external ear normal.     Nose: Congestion present.     Right Turbinates: Enlarged and swollen.     Left Turbinates: Enlarged and swollen.     Right Sinus: No maxillary sinus tenderness or frontal sinus tenderness.     Left Sinus: No maxillary sinus tenderness or frontal sinus tenderness.     Mouth/Throat:     Lips: Pink.     Mouth: Mucous membranes are moist.     Pharynx: Oropharynx is clear. Uvula midline. Posterior oropharyngeal erythema present. No pharyngeal swelling.  Eyes:     Extraocular Movements: Extraocular movements intact.     Conjunctiva/sclera: Conjunctivae normal.     Pupils: Pupils are equal, round, and reactive to light.  Cardiovascular:     Rate and Rhythm: Normal rate and regular rhythm.     Pulses: Normal pulses.     Heart sounds: Normal heart sounds.  Pulmonary:     Effort: Pulmonary effort is normal. No respiratory distress.     Breath sounds: Normal breath sounds. No stridor. No wheezing, rhonchi or rales.  Abdominal:     General: Bowel sounds are normal.  Palpations: Abdomen is soft.     Tenderness: There is no abdominal tenderness.  Musculoskeletal:     Cervical back: Normal range of motion.   Lymphadenopathy:     Cervical: No cervical adenopathy.  Neurological:     General: No focal deficit present.     Mental Status: She is alert and oriented to person, place, and time.  Psychiatric:        Mood and Affect: Mood normal.        Behavior: Behavior normal.      UC Treatments / Results  Labs (all labs ordered are listed, but only abnormal results are displayed) Labs Reviewed - No data to display  EKG   Radiology No results found.  Procedures Procedures (including critical care time)  Medications Ordered in UC Medications - No data to display  Initial Impression / Assessment and Plan / UC Course  I have reviewed the triage vital signs and the nursing notes.  Pertinent labs & imaging results that were available during my care of the patient were reviewed by me and considered in my medical decision making (see chart for details).  The patient is well-appearing, she is in no acute distress, she is hypertensive, but vital signs are otherwise stable.  Suspect postnasal drainage is causing the patient's cough due to the trigger of her symptoms.  Will start patient on guaifenesin 100 mg for her cough and cetirizine 10 mg to work as an antihistamine.  Do not suspect pneumonia or other systemic infection at this time.  Supportive care recommendations were provided to the patient to include use of a humidifier at bedtime, and warm salt water gargles to help with throat pain or discomfort.  Discussed indications of when follow-up may be necessary.  Patient verbalizes understanding.  All questions were answered.  Patient stable for discharge.  Final Clinical Impressions(s) / UC Diagnoses   Final diagnoses:  Cough, unspecified type     Discharge Instructions      Take medication as prescribed. Increase fluids and allow for plenty of rest. Recommend Tylenol or ibuprofen as needed for pain, fever, or general discomfort. Warm salt water gargles 3-4 times daily to help  with throat pain or discomfort. Recommend using a humidifier at bedtime during sleep to help with cough and nasal congestion and sleep elevated on 2 pillows. Follow-up with your primary care physician or in this clinic if your symptoms fail to improve.      ED Prescriptions     Medication Sig Dispense Auth. Provider   guaiFENesin (ROBITUSSIN) 100 MG/5ML liquid Take 5 mLs by mouth every 6 (six) hours as needed for up to 10 days for cough or to loosen phlegm. 200 mL Mykael Batz-Warren, Alda Lea, NP   cetirizine (ZYRTEC) 10 MG tablet Take 1 tablet (10 mg total) by mouth daily. 30 tablet Geniya Fulgham-Warren, Alda Lea, NP      PDMP not reviewed this encounter.   Tish Men, NP 12/23/22 1557

## 2022-12-23 NOTE — Discharge Instructions (Addendum)
Take medication as prescribed. Increase fluids and allow for plenty of rest. Recommend Tylenol or ibuprofen as needed for pain, fever, or general discomfort. Warm salt water gargles 3-4 times daily to help with throat pain or discomfort. Recommend using a humidifier at bedtime during sleep to help with cough and nasal congestion and sleep elevated on 2 pillows. Follow-up with your primary care physician or in this clinic if your symptoms fail to improve.

## 2022-12-25 ENCOUNTER — Encounter: Payer: Self-pay | Admitting: Family Medicine

## 2022-12-25 DIAGNOSIS — M25511 Pain in right shoulder: Secondary | ICD-10-CM

## 2022-12-31 NOTE — Patient Instructions (Signed)
  Procedure: COLONOSCOPY Estimated body mass index is 23.49 kg/m as calculated from the following:   Height as of this encounter: '5\' 7"'$  (1.702 m).   Weight as of this encounter: 150 lb (68 kg).   Have you had a colonoscopy before?  YES  Do you have family history of colon cancer?  NO  Do you have a family history of polyps? NO  Previous colonoscopy with polyps removed? NO  Do you have a history colorectal cancer?   NO  Are you diabetic?  YES, TYPE 2  Do you have a prosthetic or mechanical heart valve? NO  Do you have a pacemaker/defibrillator?   NO  Have you had endocarditis/atrial fibrillation?  NO  Do you use supplemental oxygen/CPAP?  NO  Have you had joint replacement within the last 12 months?  NO  Do you tend to be constipated or have to use laxatives?  NO   Do you have history of alcohol use? If yes, how much and how often.  NO  Do you have history or are you using drugs? If yes, what do are you  using?  NO  Have you ever had a stroke/heart attack?  NO  Have you ever had a heart or other vascular stent placed? NO  Do you take weight loss medication? NO  female patients,: have you had a hysterectomy? NO                              are you post menopausal?  YES                              do you still have your menstrual cycle? NO    Date of last menstrual period? 2009  Do you take any blood-thinning medications such as: (Plavix, aspirin, Coumadin, Aggrenox, Brilinta, Xarelto, Eliquis, Pradaxa, Savaysa or Effient)? NO  If yes we need the name, milligram, dosage and who is prescribing doctor:  N/A             Current Outpatient Medications  Medication Sig Dispense Refill   atorvastatin (LIPITOR) 20 MG tablet Take 1 tablet (20 mg total) by mouth daily. 30 tablet 6   enalapril (VASOTEC) 20 MG tablet Take 1 tablet (20 mg total) by mouth daily. 30 tablet 6   glipiZIDE (GLUCOTROL) 10 MG tablet Take by mouth 2 (two) times daily.     Magnesium Glycinate 100 MG  CAPS Take 100 mg by mouth daily.     metFORMIN (GLUCOPHAGE) 500 MG tablet Take 2 tablets (1,000 mg total) by mouth 2 (two) times daily with a meal. 120 tablet 6   No current facility-administered medications for this visit.    Allergies  Allergen Reactions   Ozempic (0.25 Or 0.5 Mg-Dose) [Semaglutide(0.25 Or 0.'5mg'$ -Dos)] Nausea And Vomiting   Septra [Sulfamethoxazole-Trimethoprim]

## 2023-01-01 ENCOUNTER — Ambulatory Visit (INDEPENDENT_AMBULATORY_CARE_PROVIDER_SITE_OTHER): Payer: Medicare Other

## 2023-01-01 VITALS — BP 136/74 | Ht 67.0 in | Wt 152.6 lb

## 2023-01-01 DIAGNOSIS — Z1211 Encounter for screening for malignant neoplasm of colon: Secondary | ICD-10-CM | POA: Diagnosis not present

## 2023-01-01 DIAGNOSIS — Z0000 Encounter for general adult medical examination without abnormal findings: Secondary | ICD-10-CM | POA: Diagnosis not present

## 2023-01-01 NOTE — Patient Instructions (Addendum)
Mary Vazquez , Thank you for taking time to come for your Medicare Wellness Visit. I appreciate your ongoing commitment to your health goals. Please review the following plan we discussed and let me know if I can assist you in the future.   These are the goals we discussed:  Goals      Have 3 meals a day     Cook more at home.        This is a list of the screening recommended for you and due dates:  Health Maintenance  Topic Date Due   Zoster (Shingles) Vaccine (1 of 2) Never done   Medicare Annual Wellness Visit  12/23/2022   Colon Cancer Screening  01/09/2023   Eye exam for diabetics  03/15/2023   Yearly kidney function blood test for diabetes  05/20/2023   Hemoglobin A1C  05/26/2023   Yearly kidney health urinalysis for diabetes  11/25/2023   Complete foot exam   11/25/2023   Mammogram  08/10/2024   DTaP/Tdap/Td vaccine (2 - Tdap) 06/29/2026   Pneumonia Vaccine  Completed   Flu Shot  Completed   DEXA scan (bone density measurement)  Completed   HPV Vaccine  Aged Out   COVID-19 Vaccine  Discontinued   Hepatitis C Screening: USPSTF Recommendation to screen - Ages 70-79 yo.  Discontinued    Advanced directives: Advance directive discussed with you today. I have provided a copy for you to complete at home and have notarized. Once this is complete please bring a copy in to our office so we can scan it into your chart.   Conditions/risks identified: Aim for 30 minutes of exercise or brisk walking, 6-8 glasses of water, and 5 servings of fruits and vegetables each day.   Next appointment: Follow up in one year for your annual wellness visit    Preventive Care 65 Years and Older, Female Preventive care refers to lifestyle choices and visits with your health care provider that can promote health and wellness. What does preventive care include? A yearly physical exam. This is also called an annual well check. Dental exams once or twice a year. Routine eye exams. Ask your  health care provider how often you should have your eyes checked. Personal lifestyle choices, including: Daily care of your teeth and gums. Regular physical activity. Eating a healthy diet. Avoiding tobacco and drug use. Limiting alcohol use. Practicing safe sex. Taking low-dose aspirin every day. Taking vitamin and mineral supplements as recommended by your health care provider. What happens during an annual well check? The services and screenings done by your health care provider during your annual well check will depend on your age, overall health, lifestyle risk factors, and family history of disease. Counseling  Your health care provider may ask you questions about your: Alcohol use. Tobacco use. Drug use. Emotional well-being. Home and relationship well-being. Sexual activity. Eating habits. History of falls. Memory and ability to understand (cognition). Work and work Statistician. Reproductive health. Screening  You may have the following tests or measurements: Height, weight, and BMI. Blood pressure. Lipid and cholesterol levels. These may be checked every 5 years, or more frequently if you are over 69 years old. Skin check. Lung cancer screening. You may have this screening every year starting at age 58 if you have a 30-pack-year history of smoking and currently smoke or have quit within the past 15 years. Fecal occult blood test (FOBT) of the stool. You may have this test every year starting at age 56.  Flexible sigmoidoscopy or colonoscopy. You may have a sigmoidoscopy every 5 years or a colonoscopy every 10 years starting at age 72. Hepatitis C blood test. Hepatitis B blood test. Sexually transmitted disease (STD) testing. Diabetes screening. This is done by checking your blood sugar (glucose) after you have not eaten for a while (fasting). You may have this done every 1-3 years. Bone density scan. This is done to screen for osteoporosis. You may have this done  starting at age 50. Mammogram. This may be done every 1-2 years. Talk to your health care provider about how often you should have regular mammograms. Talk with your health care provider about your test results, treatment options, and if necessary, the need for more tests. Vaccines  Your health care provider may recommend certain vaccines, such as: Influenza vaccine. This is recommended every year. Tetanus, diphtheria, and acellular pertussis (Tdap, Td) vaccine. You may need a Td booster every 10 years. Zoster vaccine. You may need this after age 60. Pneumococcal 13-valent conjugate (PCV13) vaccine. One dose is recommended after age 31. Pneumococcal polysaccharide (PPSV23) vaccine. One dose is recommended after age 16. Talk to your health care provider about which screenings and vaccines you need and how often you need them. This information is not intended to replace advice given to you by your health care provider. Make sure you discuss any questions you have with your health care provider. Document Released: 12/27/2015 Document Revised: 08/19/2016 Document Reviewed: 10/01/2015 Elsevier Interactive Patient Education  2017 [place] Prevention in the Home Falls can cause injuries. They can happen to people of all ages. There are many things you can do to make your home safe and to help prevent falls. What can I do on the outside of my home? Regularly fix the edges of walkways and driveways and fix any cracks. Remove anything that might make you trip as you walk through a door, such as a raised step or threshold. Trim any bushes or trees on the path to your home. Use bright outdoor lighting. Clear any walking paths of anything that might make someone trip, such as rocks or tools. Regularly check to see if handrails are loose or broken. Make sure that both sides of any steps have handrails. Any raised decks and porches should have guardrails on the edges. Have any leaves, snow, or  ice cleared regularly. Use sand or salt on walking paths during winter. Clean up any spills in your garage right away. This includes oil or grease spills. What can I do in the bathroom? Use night lights. Install grab bars by the toilet and in the tub and shower. Do not use towel bars as grab bars. Use non-skid mats or decals in the tub or shower. If you need to sit down in the shower, use a plastic, non-slip stool. Keep the floor dry. Clean up any water that spills on the floor as soon as it happens. Remove soap buildup in the tub or shower regularly. Attach bath mats securely with double-sided non-slip rug tape. Do not have throw rugs and other things on the floor that can make you trip. What can I do in the bedroom? Use night lights. Make sure that you have a light by your bed that is easy to reach. Do not use any sheets or blankets that are too big for your bed. They should not hang down onto the floor. Have a firm chair that has side arms. You can use this for support while you get  dressed. Do not have throw rugs and other things on the floor that can make you trip. What can I do in the kitchen? Clean up any spills right away. Avoid walking on wet floors. Keep items that you use a lot in easy-to-reach places. If you need to reach something above you, use a strong step stool that has a grab bar. Keep electrical cords out of the way. Do not use floor polish or wax that makes floors slippery. If you must use wax, use non-skid floor wax. Do not have throw rugs and other things on the floor that can make you trip. What can I do with my stairs? Do not leave any items on the stairs. Make sure that there are handrails on both sides of the stairs and use them. Fix handrails that are broken or loose. Make sure that handrails are as long as the stairways. Check any carpeting to make sure that it is firmly attached to the stairs. Fix any carpet that is loose or worn. Avoid having throw rugs at  the top or bottom of the stairs. If you do have throw rugs, attach them to the floor with carpet tape. Make sure that you have a light switch at the top of the stairs and the bottom of the stairs. If you do not have them, ask someone to add them for you. What else can I do to help prevent falls? Wear shoes that: Do not have high heels. Have rubber bottoms. Are comfortable and fit you well. Are closed at the toe. Do not wear sandals. If you use a stepladder: Make sure that it is fully opened. Do not climb a closed stepladder. Make sure that both sides of the stepladder are locked into place. Ask someone to hold it for you, if possible. Clearly mark and make sure that you can see: Any grab bars or handrails. First and last steps. Where the edge of each step is. Use tools that help you move around (mobility aids) if they are needed. These include: Canes. Walkers. Scooters. Crutches. Turn on the lights when you go into a dark area. Replace any light bulbs as soon as they burn out. Set up your furniture so you have a clear path. Avoid moving your furniture around. If any of your floors are uneven, fix them. If there are any pets around you, be aware of where they are. Review your medicines with your doctor. Some medicines can make you feel dizzy. This can increase your chance of falling. Ask your doctor what other things that you can do to help prevent falls. This information is not intended to replace advice given to you by your health care provider. Make sure you discuss any questions you have with your health care provider. Document Released: 09/26/2009 Document Revised: 05/07/2016 Document Reviewed: 01/04/2015 Elsevier Interactive Patient Education  2017 [employer].

## 2023-01-01 NOTE — Progress Notes (Signed)
Subjective:   Mary Vazquez is a 67 y.o. female who presents for Medicare Annual (Subsequent) preventive examination.  Review of Systems     Cardiac Risk Factors include: advanced age (>52mn, >>48women);diabetes mellitus;hypertension     Objective:    Today's Vitals   01/01/23 1100  BP: 136/74  Weight: 152 lb 9.6 oz (69.2 kg)  Height: '5\' 7"'$  (1.702 m)   Body mass index is 23.9 kg/m.     01/01/2023   11:27 AM 12/23/2021    9:25 AM 06/29/2017   10:40 AM 03/02/2016    8:12 AM 04/10/2015   11:26 AM 01/09/2013    7:41 AM  Advanced Directives  Does Patient Have a Medical Advance Directive? No No No No No Patient does not have advance directive  Would patient like information on creating a medical advance directive? Yes (MAU/Ambulatory/Procedural Areas - Information given) No - Patient declined  No - patient declined information No - patient declined information     Current Medications (verified) Outpatient Encounter Medications as of 01/01/2023  Medication Sig   atorvastatin (LIPITOR) 20 MG tablet Take 1 tablet (20 mg total) by mouth daily.   enalapril (VASOTEC) 20 MG tablet Take 1 tablet (20 mg total) by mouth daily.   glipiZIDE (GLUCOTROL) 10 MG tablet Take by mouth 2 (two) times daily.   Magnesium Glycinate 100 MG CAPS Take 100 mg by mouth daily.   metFORMIN (GLUCOPHAGE) 500 MG tablet Take 2 tablets (1,000 mg total) by mouth 2 (two) times daily with a meal.   No facility-administered encounter medications on file as of 01/01/2023.    Allergies (verified) Ozempic (0.25 or 0.5 mg-dose) [semaglutide(0.25 or 0.'5mg'$ -dos)] and Septra [sulfamethoxazole-trimethoprim]   History: Past Medical History:  Diagnosis Date   Diabetes ([place]    Hypertension    PONV (postoperative nausea and vomiting)    Past Surgical History:  Procedure Laterality Date   COLONOSCOPY   08/08/2007   SLF:  A 5-mm sessile descending colon polyp removed via cold forceps.Normal retroflexed view of the  rectum/ Small internal hemorrhoids, PATH: tubular adenoma   COLONOSCOPY  01/09/2013   Procedure: COLONOSCOPY;  Surgeon: SDanie Binder MD;  Location: [facility];  Service: Endoscopy;  Laterality: N/A;  8:30   TUBAL LIGATION     Family History  Problem Relation Age of Onset   Cancer Father        prostate   Diabetes Sister    Diabetes Maternal Grandmother    Diabetes Paternal Aunt    Colon cancer Neg Hx    Social History   Socioeconomic History   Marital status: Widowed    Spouse name: Not on file   Number of children: 3   Years of education: Not on file   Highest education level: Not on file  Occupational History   Not on file  Tobacco Use   Smoking status: Never   Smokeless tobacco: Never  Vaping Use   Vaping Use: Never used  Substance and Sexual Activity   Alcohol use: No   Drug use: No   Sexual activity: Not Currently    Birth control/protection: Post-menopausal, Surgical    Comment: tubal  Other Topics Concern   Not on file  Social History Narrative   Husband passed 01/2020.   3 children    2 grandchildren   Social Determinants of Health   Financial Resource Strain: Low Risk  (01/01/2023)   Overall Financial Resource Strain (CARDIA)    Difficulty of Paying  Living Expenses: Not hard at all  Food Insecurity: No Food Insecurity (01/01/2023)   Hunger Vital Sign    Worried About Running Out of Food in the Last Year: Never true    Ran Out of Food in the Last Year: Never true  Transportation Needs: No Transportation Needs (01/01/2023)   PRAPARE - Hydrologist (Medical): No    Lack of Transportation (Non-Medical): No  Physical Activity: Sufficiently Active (01/01/2023)   Exercise Vital Sign    Days of Exercise per Week: 5 days    Minutes of Exercise per Session: 30 min  Stress: No Stress Concern Present (01/01/2023)   [place]    Feeling of Stress : Not at all  Social  Connections: Moderately Isolated (12/31/2022)   Social Connection and Isolation Panel [NHANES]    Frequency of Communication with Friends and Family: More than three times a week    Frequency of Social Gatherings with Friends and Family: Once a week    Attends Religious Services: More than 4 times per year    Active Member of [employer] or Organizations: No    Attends Archivist Meetings: Never    Marital Status: Widowed    Tobacco Counseling Counseling given: Not Answered   Clinical Intake:  Pre-visit preparation completed: Yes  Pain : No/denies pain     Diabetes: Yes CBG done?: No Did pt. bring in CBG monitor from home?: No  How often do you need to have someone help you when you read instructions, pamphlets, or other written materials from your doctor or pharmacy?: 1 - Never  Diabetic?Yes  Nutrition Risk Assessment:  Has the patient had any N/V/D within the last 2 months?  No  Does the patient have any non-healing wounds?  No  Has the patient had any unintentional weight loss or weight gain?  No   Diabetes:  Is the patient diabetic?  Yes  If diabetic, was a CBG obtained today?  No  Did the patient bring in their glucometer from home?  No  How often do you monitor your CBG's? As directed by endocrinology.   Financial Strains and Diabetes Management:  Are you having any financial strains with the device, your supplies or your medication? No .  Does the patient want to be seen by Chronic Care Management for management of their diabetes?  No  Would the patient like to be referred to a Nutritionist or for Diabetic Management?   Currently seeing dietician   Diabetic Exams:  Diabetic Eye Exam: Completed 03/14/22 Diabetic Foot Exam: Completed 11/24/22    Interpreter Needed?: No  Information entered by :: Denman George LPN   Activities of Daily Living    01/01/2023   11:27 AM 12/31/2022    8:40 PM  In your present state of health, do you have any difficulty  performing the following activities:  Hearing? 0 0  Vision? 0 0  Difficulty concentrating or making decisions? 0 0  Walking or climbing stairs? 0 0  Dressing or bathing? 0 0  Doing errands, shopping? 0 0  Preparing Food and eating ? N N  Using the Toilet? N N  In the past six months, have you accidently leaked urine? N N  Do you have problems with loss of bowel control? N N  Managing your Medications? N N  Managing your Finances? N N  Housekeeping or managing your Housekeeping? N N    Patient Care  Team: Coral Spikes, DO as PCP - General (Family Medicine) Danie Binder, MD (Inactive) as Attending Physician (Gastroenterology) Estill Dooms, NP as Nurse Practitioner (Obstetrics and Gynecology) Arlana Lindau, RD as Dietitian (Nutrition) Brita Romp, NP as Nurse Practitioner (Nurse Practitioner)  Indicate any recent Medical Services you may have received from other than [facility] providers in the past year (date may be approximate).     Assessment:   This is a routine wellness examination for Sap[place].  Hearing/Vision screen Hearing Screening - Comments:: Denies hearing difficulties   Vision Screening - Comments:: Wears rx glasses - up to date with routine eye exams with [facility]    Annette Stable issues and exercise activities discussed: Current Exercise Habits: Home exercise routine, Type of exercise: walking, Time (Minutes): 60, Frequency (Times/Week): 5, Weekly Exercise (Minutes/Week): 300, Intensity: Moderate   Goals Addressed   None    Depression Screen    01/01/2023   11:21 AM 11/24/2022    9:36 AM 09/08/2022    3:30 PM 08/25/2022    9:41 AM 05/19/2022    8:30 AM 12/23/2021    9:19 AM 11/18/2021    8:43 AM  PHQ 2/9 Scores  PHQ - 2 Score 0 0 0 0 0 0 0    Fall Risk    01/01/2023   11:19 AM 12/31/2022    8:40 PM 11/24/2022    9:36 AM 09/08/2022    3:30 PM 08/25/2022    9:41 AM  [place] in the past year? 0 0 0 0 0  Number falls in past yr: 0   0 0   Injury with Fall? 0 0   0  Risk for fall due to :   No Fall Risks  No Fall Risks  Follow up Falls prevention discussed;Education provided;Falls evaluation completed  Falls evaluation completed  Falls evaluation completed    FALL RISK PREVENTION PERTAINING TO THE HOME:  Any stairs in or around the home? Yes  If so, are there any without handrails? No  Home free of loose throw rugs in walkways, pet beds, electrical cords, etc? Yes  Adequate lighting in your home to reduce risk of falls? Yes   ASSISTIVE DEVICES UTILIZED TO PREVENT FALLS:  Life alert? No  Use of a cane, walker or w/c? No  Grab bars in the bathroom? Yes  Shower chair or bench in shower? No  Elevated toilet seat or a handicapped toilet? Yes   TIMED UP AND GO:  Was the test performed? Yes .  Length of time to ambulate 10 feet: 5 sec.   Gait steady and fast without use of assistive device  Cognitive Function:        01/01/2023   11:28 AM 12/23/2021    9:30 AM  6CIT Screen  What Year? 0 points 0 points  What month? 0 points 0 points  What time? 0 points 0 points  Count back from 20 0 points 0 points  Months in reverse 0 points 0 points  Repeat phrase 0 points 2 points  Total Score 0 points 2 points    Immunizations Immunization History  Administered Date(s) Administered   Fluad Quad(high Dose 65+) 10/09/2020, 10/07/2021, 08/25/2022   Influenza, High Dose Seasonal PF 10/23/2019   Influenza,inj,Quad PF,6+ Mos 08/23/2014, 11/18/2015, 11/01/2017, 10/18/2018   Influenza-Unspecified 10/01/2016, 10/18/2018   Moderna SARS-COV2 Booster Vaccination 10/30/2020, 06/10/2021   Moderna Sars-Covid-2 Vaccination 03/24/2020, 04/22/2020   PNEUMOCOCCAL CONJUGATE-20 11/24/2022   Td 06/29/2016  TDAP status: Up to date  Flu Vaccine status: Up to date  Pneumococcal vaccine status: Up to date  Covid-19 vaccine status: Information provided on how to obtain vaccines.   Qualifies for Shingles Vaccine? Yes   Zostavax  completed No   Shingrix Completed?: No.    Education has been provided regarding the importance of this vaccine. Patient has been advised to call insurance company to determine out of pocket expense if they have not yet received this vaccine. Advised may also receive vaccine at local pharmacy or Health Dept. Verbalized acceptance and understanding.  Screening Tests Health Maintenance  Topic Date Due   Zoster Vaccines- Shingrix (1 of 2) Never done   COLONOSCOPY (Pts 45-2yr Insurance coverage will need to be confirmed)  01/09/2023   OPHTHALMOLOGY EXAM  03/15/2023   Diabetic kidney evaluation - eGFR measurement  05/20/2023   HEMOGLOBIN A1C  05/26/2023   Diabetic kidney evaluation - Urine ACR  11/25/2023   FOOT EXAM  11/25/2023   Medicare Annual Wellness (AWV)  01/02/2024   MAMMOGRAM  08/10/2024   DTaP/Tdap/Td (2 - Tdap) 06/29/2026   Pneumonia Vaccine 67 Years old  Completed   INFLUENZA VACCINE  Completed   DEXA SCAN  Completed   HPV VACCINES  Aged Out   COVID-19 Vaccine  Discontinued   Hepatitis C Screening  Discontinued    Health Maintenance  Health Maintenance Due  Topic Date Due   Zoster Vaccines- Shingrix (1 of 2) Never done    Colorectal cancer screening: Type of screening: Colonoscopy. Completed 01/09/13. Repeat every 10 years  Mammogram status: Completed 08/10/22. Repeat every year  Bone Density status: Completed 12/31/21. Results reflect: Bone density results: OSTEOPENIA. Repeat every 2 years.  Lung Cancer Screening: (Low Dose CT Chest recommended if Age 69-80years, 30 pack-year currently smoking OR have quit w/in 15years.) does not qualify.   Lung Cancer Screening Referral: n/a   Additional Screening:  Hepatitis C Screening: does not qualify; due to patient declining   Vision Screening: Recommended annual ophthalmology exams for early detection of glaucoma and other disorders of the eye. Is the patient up to date with their annual eye exam?  Yes  Who is the  provider or what is the name of the office in which the patient attends annual eye exams? [facility]  If pt is not established with a provider, would they like to be referred to a provider to establish care? No .   Dental Screening: Recommended annual dental exams for proper oral hygiene  Community Resource Referral / Chronic Care Management: CRR required this visit?  No   CCM required this visit?  No      Plan:     I have personally reviewed and noted the following in the patient's chart:   Medical and social history Use of alcohol, tobacco or illicit drugs  Current medications and supplements including opioid prescriptions. Patient is not currently taking opioid prescriptions. Functional ability and status Nutritional status Physical activity Advanced directives List of other physicians Hospitalizations, surgeries, and ER visits in previous 12 months Vitals Screenings to include cognitive, depression, and falls Referrals and appointments  In addition, I have reviewed and discussed with patient certain preventive protocols, quality metrics, and best practice recommendations. A written personalized care plan for preventive services as well as general preventive health recommendations were provided to patient.     SDenman GeorgeBLake Pl[place]  15/02/8881  Nurse Notes: Patient is asking for a referral to Ortho for right  should pain x 2 weeks.  No known injury.  Would like to see Dr. Aline Brochure.  Will route concern as a telephone note.

## 2023-01-05 ENCOUNTER — Ambulatory Visit: Payer: Medicare Other | Admitting: Orthopedic Surgery

## 2023-01-06 ENCOUNTER — Encounter: Payer: Self-pay | Admitting: Nurse Practitioner

## 2023-01-06 ENCOUNTER — Encounter: Payer: Self-pay | Admitting: *Deleted

## 2023-01-06 ENCOUNTER — Ambulatory Visit: Payer: Medicare Other | Admitting: Nurse Practitioner

## 2023-01-06 VITALS — BP 142/98 | HR 75 | Ht 67.0 in | Wt 152.8 lb

## 2023-01-06 DIAGNOSIS — E119 Type 2 diabetes mellitus without complications: Secondary | ICD-10-CM

## 2023-01-06 DIAGNOSIS — N182 Chronic kidney disease, stage 2 (mild): Secondary | ICD-10-CM

## 2023-01-06 DIAGNOSIS — E1122 Type 2 diabetes mellitus with diabetic chronic kidney disease: Secondary | ICD-10-CM | POA: Diagnosis not present

## 2023-01-06 MED ORDER — METFORMIN HCL 500 MG PO TABS: 1000.0000 mg | ORAL_TABLET | Freq: Two times a day (BID) | ORAL | 3 refills | Status: DC

## 2023-01-06 MED ORDER — GLIPIZIDE 5 MG PO TABS: 5.0000 mg | ORAL_TABLET | Freq: Every day | ORAL | 1 refills | Status: DC

## 2023-01-06 NOTE — Progress Notes (Signed)
Endocrinology Follow Up Note       01/06/2023, 11:25 AM   Subjective:    Patient ID: Mary Vazquez, female    DOB: 06/04/1956.  Mary Vazquez is being seen in follow up after being seen in consultation for management of currently uncontrolled symptomatic diabetes requested by  Coral Spikes, DO.   Past Medical History:  Diagnosis Date   Diabetes ([place])    Hypertension    PONV (postoperative nausea and vomiting)     Past Surgical History:  Procedure Laterality Date   COLONOSCOPY   08/08/2007   SLF:  A 5-mm sessile descending colon polyp removed via cold forceps.Normal retroflexed view of the rectum/ Small internal hemorrhoids, PATH: tubular adenoma   COLONOSCOPY  01/09/2013   Procedure: COLONOSCOPY;  Surgeon: Danie Binder, MD;  Location: [facility];  Service: Endoscopy;  Laterality: N/A;  8:30   TUBAL LIGATION      Social History   Socioeconomic History   Marital status: Widowed    Spouse name: Not on file   Number of children: 3   Years of education: Not on file   Highest education level: Not on file  Occupational History   Not on file  Tobacco Use   Smoking status: Never   Smokeless tobacco: Never  Vaping Use   Vaping Use: Never used  Substance and Sexual Activity   Alcohol use: No   Drug use: No   Sexual activity: Not Currently    Birth control/protection: Post-menopausal, Surgical    Comment: tubal  Other Topics Concern   Not on file  Social History Narrative   Husband passed 01/2020.   3 children    2 grandchildren   Social Determinants of Health   Financial Resource Strain: Low Risk  (01/01/2023)   Overall Financial Resource Strain (CARDIA)    Difficulty of Paying Living Expenses: Not hard at all  Food Insecurity: No Food Insecurity (01/01/2023)   Hunger Vital Sign    Worried About Running Out of Food in the Last Year: Never true    Ran Out of Food in the Last Year:  Never true  Transportation Needs: No Transportation Needs (01/01/2023)   PRAPARE - Hydrologist (Medical): No    Lack of Transportation (Non-Medical): No  Physical Activity: Sufficiently Active (01/01/2023)   Exercise Vital Sign    Days of Exercise per Week: 5 days    Minutes of Exercise per Session: 30 min  Stress: No Stress Concern Present (01/01/2023)   [place]    Feeling of Stress : Not at all  Social Connections: Moderately Isolated (12/31/2022)   Social Connection and Isolation Panel [NHANES]    Frequency of Communication with Friends and Family: More than three times a week    Frequency of Social Gatherings with Friends and Family: Once a week    Attends Religious Services: More than 4 times per year    Active Member of [employer] or Organizations: No    Attends Archivist Meetings: Never    Marital Status: Widowed    Family History  Problem Relation Age of Onset  Cancer Father        prostate   Diabetes Sister    Diabetes Maternal Grandmother    Diabetes Paternal Aunt    Colon cancer Neg Hx     Outpatient Encounter Medications as of 01/06/2023  Medication Sig   atorvastatin (LIPITOR) 20 MG tablet Take 1 tablet (20 mg total) by mouth daily.   enalapril (VASOTEC) 20 MG tablet Take 1 tablet (20 mg total) by mouth daily.   glipiZIDE (GLUCOTROL) 5 MG tablet Take 1 tablet (5 mg total) by mouth daily before breakfast.   Magnesium Glycinate 100 MG CAPS Take 100 mg by mouth daily.   [DISCONTINUED] glipiZIDE (GLUCOTROL) 10 MG tablet Take by mouth 2 (two) times daily.   [DISCONTINUED] metFORMIN (GLUCOPHAGE) 500 MG tablet Take 2 tablets (1,000 mg total) by mouth 2 (two) times daily with a meal.   metFORMIN (GLUCOPHAGE) 500 MG tablet Take 2 tablets (1,000 mg total) by mouth 2 (two) times daily with a meal.   No facility-administered encounter medications on file as of 01/06/2023.     ALLERGIES: Allergies  Allergen Reactions   Ozempic (0.25 Or 0.5 Mg-Dose) [Semaglutide(0.25 Or 0.'5mg'$ -Dos)] Nausea And Vomiting   Septra [Sulfamethoxazole-Trimethoprim]     VACCINATION STATUS: Immunization History  Administered Date(s) Administered   Fluad Quad(high Dose 65+) 10/09/2020, 10/07/2021, 08/25/2022   Influenza, High Dose Seasonal PF 10/23/2019   Influenza,inj,Quad PF,6+ Mos 08/23/2014, 11/18/2015, 11/01/2017, 10/18/2018   Influenza-Unspecified 10/01/2016, 10/18/2018   Moderna SARS-COV2 Booster Vaccination 10/30/2020, 06/10/2021   Moderna Sars-Covid-2 Vaccination 03/24/2020, 04/22/2020   PNEUMOCOCCAL CONJUGATE-20 11/24/2022   Td 06/29/2016    Diabetes She presents for her follow-up diabetic visit. She has type 2 diabetes mellitus. Onset time: Diagnosed at approx age of 67 (also had Gest DM) Hypoglycemia symptoms include tremors. There are no diabetic associated symptoms. There are no hypoglycemic complications. There are no diabetic complications. Risk factors for coronary artery disease include diabetes mellitus and family history. Current diabetic treatment includes oral agent (triple therapy). She is compliant with treatment most of the time. Her weight is stable. She is following a generally healthy diet. She has had a previous visit with a dietitian. She participates in exercise daily. Her breakfast blood glucose range is generally 180-200 mg/dl. Her bedtime blood glucose range is generally 110-130 mg/dl. (She presents today with her logs, no meter, showing above target fasting and tight bedtime readings.  Her previsit A1c on 12/12 was 8%, increasing slightly from last visit of 7.8%.  She reports significant amount of stress since her initial visit, had a death in the family and then caught a cold around Christmas.  She does note some symptoms of hypoglycemia but it is less often than when she was on Jardiance.) An ACE inhibitor/angiotensin II receptor blocker is being taken.  She does not see a podiatrist.Eye exam is current.    Review of systems  Constitutional: + Minimally fluctuating body weight,  current Body mass index is 23.93 kg/m. , no fatigue, no subjective hyperthermia, no subjective hypothermia Eyes: no blurry vision, no xerophthalmia ENT: no sore throat, no nodules palpated in throat, no dysphagia/odynophagia, no hoarseness Cardiovascular: no chest pain, no shortness of breath, no palpitations, no leg swelling Respiratory: no cough, no shortness of breath Gastrointestinal: no nausea/vomiting/diarrhea Musculoskeletal: no muscle/joint aches Skin: no rashes, no hyperemia Neurological: no tremors, no numbness, no tingling, no dizziness Psychiatric: no depression, no anxiety  Objective:     BP (!) 142/98 Comment: retaken with the manuel cuff  Pulse 75  Ht '5\' 7"'$  (1.702 m)   Wt 152 lb 12.8 oz (69.3 kg)   BMI 23.93 kg/m   Wt Readings from Last 3 Encounters:  01/06/23 152 lb 12.8 oz (69.3 kg)  01/01/23 152 lb 9.6 oz (69.2 kg)  12/31/22 150 lb (68 kg)     BP Readings from Last 3 Encounters:  01/06/23 (!) 142/98  01/01/23 136/74  12/23/22 (!) 161/87     Physical Exam- Limited  Constitutional:  Body mass index is 23.93 kg/m. , not in acute distress, normal state of mind Eyes:  EOMI, no exophthalmos Musculoskeletal: no gross deformities, strength intact in all four extremities, no gross restriction of joint movements Skin:  no rashes, no hyperemia Neurological: no tremor with outstretched hands   Diabetic Foot Exam - Simple   No data filed     CMP ( most recent) CMP     Component Value Date/Time   NA 141 05/19/2022 0856   K 4.3 05/19/2022 0856   CL 103 05/19/2022 0856   CO2 24 05/19/2022 0856   GLUCOSE 120 (H) 05/19/2022 0856   GLUCOSE 128 (H) 01/08/2014 1209   BUN 15 05/19/2022 0856   CREATININE 0.92 05/19/2022 0856   CREATININE 0.91 01/08/2014 1209   CALCIUM 10.0 05/19/2022 0856   PROT 7.3 05/19/2022 0856   ALBUMIN 4.6  05/19/2022 0856   AST 23 05/19/2022 0856   ALT 16 05/19/2022 0856   ALKPHOS 67 05/19/2022 0856   BILITOT 0.3 05/19/2022 0856   GFRNONAA 69 10/18/2020 0821   GFRAA 79 10/18/2020 0821     Diabetic Labs (most recent): Lab Results  Component Value Date   HGBA1C 8.0 (H) 11/24/2022   HGBA1C 7.8 (H) 08/25/2022   HGBA1C 7.8 (H) 05/19/2022   MICROALBUR 30 mg/L 10/05/2022   MICROALBUR 1.22 01/08/2014     Lipid Panel ( most recent) Lipid Panel     Component Value Date/Time   CHOL 156 05/19/2022 0856   TRIG 59 05/19/2022 0856   HDL 76 05/19/2022 0856   CHOLHDL 2.1 05/19/2022 0856   CHOLHDL 2.8 01/08/2014 1209   VLDL 17 01/08/2014 1209   LDLCALC 68 05/19/2022 0856   LABVLDL 12 05/19/2022 0856      No results found for: "TSH", "FREET4"         Assessment & Plan:   1) Type 2 diabetes mellitus with stage 2 chronic kidney disease, without long-term current use of insulin ([place])  She presents today with her logs, no meter, showing above target fasting and tight bedtime readings.  Her previsit A1c on 12/12 was 8%, increasing slightly from last visit of 7.8%.  She reports significant amount of stress since her initial visit, had a death in the family and then caught a cold around Christmas.  She does note some symptoms of hypoglycemia but it is less often than when she was on Jardiance.  - Mary Vazquez has currently uncontrolled symptomatic type 2 DM since 67 years of age.   -Recent labs reviewed.  - I had a long discussion with her about the progressive nature of diabetes and the pathology behind its complications. -her diabetes is not currently complicated but she remains at a high risk for more acute and chronic complications which include CAD, CVA, CKD, retinopathy, and neuropathy. These are all discussed in detail with her.  The following Lifestyle Medicine recommendations according to [facility]) were discussed and offered to patient and  she agrees to start the journey:  A. Whole Foods, Plant-based plate comprising of fruits and vegetables, plant-based proteins, whole-grain carbohydrates was discussed in detail with the patient.   A list for source of those nutrients were also provided to the patient.  Patient will use only water or unsweetened tea for hydration. B.  The need to stay away from risky substances including alcohol, smoking; obtaining 7 to 9 hours of restorative sleep, at least 150 minutes of moderate intensity exercise weekly, the importance of healthy social connections,  and stress reduction techniques were discussed. C.  A full color page of  Calorie density of various food groups per pound showing examples of each food groups was provided to the patient.  - Nutritional counseling repeated at each appointment due to patients tendency to fall back in to old habits.  - The patient admits there is a room for improvement in their diet and drink choices. -  Suggestion is made for the patient to avoid simple carbohydrates from their diet including Cakes, Sweet Desserts / Pastries, Ice Cream, Soda (diet and regular), Sweet Tea, Candies, Chips, Cookies, Sweet Pastries, Store Bought Juices, Alcohol in Excess of 1-2 drinks a day, Artificial Sweeteners, Coffee Creamer, and "Sugar-free" Products. This will help patient to have stable blood glucose profile and potentially avoid unintended weight gain.   - I encouraged the patient to switch to unprocessed or minimally processed complex starch and increased protein intake (animal or plant source), fruits, and vegetables.   - Patient is advised to stick to a routine mealtimes to eat 3 meals a day and avoid unnecessary snacks (to snack only to correct hypoglycemia).  - she has already been set up with Jearld Fenton, RDN, CDE for diabetes education.  - I have approached her with the following individualized plan to manage her diabetes and patient agrees:   -She is advised to  continue Metformin 1000 mg po twice daily with meals and lower her Glipizide to 5 mg po daily with breakfast (skipping her supper dose) as she may be dumping overnight based on her glucose readings.  -she is encouraged to continue monitoring glucose twice daily, before breakfast and before bed, and to call the clinic if she has readings less than 70 or above 300 for 3 tests in a row.  - Adjustment parameters are given to her for hypo and hyperglycemia in writing.  - her Vania Rea will be discontinued, risk outweighs benefit for this patient.  She notes significant vaginal yeast with this medication, requiring prescriptions to alleviate.  - she is not an ideal candidate for incretin therapy due to body habitus and BMI less than 25.  She also notes allergy to Ozempic in the past.  - Specific targets for  A1c; LDL, HDL, and Triglycerides were discussed with the patient.  2) Blood Pressure /Hypertension:  her blood pressure is slightly above target today.   she is advised to continue her current medications including Enalapril 20 mg p.o. daily with breakfast.  3) Lipids/Hyperlipidemia:    Review of her recent lipid panel from 05/19/22 showed controlled LDL at 68 .  she is advised to continue Lipitor 20 mg daily at bedtime.  Side effects and precautions discussed with her.  4)  Weight/Diet:  her Body mass index is 23.93 kg/m.  -   she is NOT a candidate for weight loss.  Exercise, and detailed carbohydrates information provided  -  detailed on discharge instructions.  5) Chronic Care/Health Maintenance: -she is on ACEI/ARB and Statin medications and is encouraged to  initiate and continue to follow up with Ophthalmology, Dentist, Podiatrist at least yearly or according to recommendations, and advised to stay away from smoking. I have recommended yearly flu vaccine and pneumonia vaccine at least every 5 years; moderate intensity exercise for up to 150 minutes weekly; and sleep for at least 7 hours a  day.  - she is advised to maintain close follow up with Coral Spikes, DO for primary care needs, as well as her other providers for optimal and coordinated care.     I spent 33 minutes in the care of the patient today including review of labs from [place], Lipids, Thyroid Function, Hematology (current and previous including abstractions from other facilities); face-to-face time discussing  her blood glucose readings/logs, discussing hypoglycemia and hyperglycemia episodes and symptoms, medications doses, her options of short and long term treatment based on the latest standards of care / guidelines;  discussion about incorporating lifestyle medicine;  and documenting the encounter. Risk reduction counseling performed per USPSTF guidelines to reduce obesity and cardiovascular risk factors.     Please refer to Patient Instructions for Blood Glucose Monitoring and Insulin/Medications Dosing Guide"  in media tab for additional information. Please  also refer to " Patient Self Inventory" in the Media  tab for reviewed elements of pertinent patient history.  Mary Vazquez participated in the discussions, expressed understanding, and voiced agreement with the above plans.  All questions were answered to her satisfaction. she is encouraged to contact clinic should she have any questions or concerns prior to her return visit.     Follow up plan: - Return in about 3 months (around 04/07/2023) for Diabetes F/U with A1c in office, No previsit labs, Bring meter and logs.   Mary Vazquez, [facility] [address] [place], [place] [address] Phone: [phone] Fax: [phone]  01/06/2023, 11:25 AM

## 2023-01-06 NOTE — Patient Instructions (Signed)

## 2023-01-06 NOTE — Progress Notes (Signed)
ASA 2. Okay to schedule. Hold glipizide morning of. Hold metformin night prior to and morning of procedure.

## 2023-01-07 ENCOUNTER — Encounter: Payer: Self-pay | Admitting: *Deleted

## 2023-01-07 MED ORDER — PEG 3350-KCL-NA BICARB-NACL 420 G PO SOLR: 4000.0000 mL | Freq: Once | ORAL | 0 refills | Status: AC

## 2023-01-07 NOTE — Progress Notes (Signed)
Spoke with pt. Scheduled with Dr. Abbey Chatters 1/30 at 10:30am. Aware will send instructions via mychart. [facility]

## 2023-01-07 NOTE — Addendum Note (Signed)
Addended by: Cheron Every on: 01/07/2023 08:25 AM   Modules accepted: Orders

## 2023-01-07 NOTE — Progress Notes (Signed)
Referral completed

## 2023-01-08 ENCOUNTER — Encounter: Payer: Self-pay | Admitting: Orthopedic Surgery

## 2023-01-08 ENCOUNTER — Ambulatory Visit (INDEPENDENT_AMBULATORY_CARE_PROVIDER_SITE_OTHER): Payer: Medicare Other | Admitting: Orthopedic Surgery

## 2023-01-08 ENCOUNTER — Ambulatory Visit (INDEPENDENT_AMBULATORY_CARE_PROVIDER_SITE_OTHER): Payer: Medicare Other

## 2023-01-08 VITALS — BP 148/87 | HR 70 | Ht 67.0 in | Wt 150.0 lb

## 2023-01-08 DIAGNOSIS — M7541 Impingement syndrome of right shoulder: Secondary | ICD-10-CM

## 2023-01-08 DIAGNOSIS — M25511 Pain in right shoulder: Secondary | ICD-10-CM

## 2023-01-08 DIAGNOSIS — M792 Neuralgia and neuritis, unspecified: Secondary | ICD-10-CM

## 2023-01-08 MED ORDER — METHYLPREDNISOLONE ACETATE 40 MG/ML IJ SUSP
40.0000 mg | Freq: Once | INTRAMUSCULAR | Status: AC
  Administered 2023-01-08: 40 mg via INTRA_ARTICULAR

## 2023-01-08 NOTE — Patient Instructions (Signed)

## 2023-01-08 NOTE — Progress Notes (Signed)
Newproblem eval   Chief Complaint  Patient presents with   Arm Pain    Right arm into shoulder / some pain into neck / started in the hand about 2 weeks ago     67 year old female presents with right upper extremity symptoms which started in her right hand with a feeling of tightness.  She is also having pain in the deltoid area and shoulder which is exacerbated by internal rotation abduction and adduction and flexion  She denies any trauma  She used ice and heat to try to relieve the symptoms.  She is scheduled for colonoscopy in a few days and has not taken any anti-inflammatories  Mid face erythema with telangiectasias +/- scattered inflammatory papules.     Review of Systems  Constitutional:  Negative for chills and fever.  Respiratory:  Negative for shortness of breath.   Cardiovascular:  Negative for chest pain.  Neurological:  Negative for tingling and tremors.    Physical Exam Vitals and nursing note reviewed.  Constitutional:      Appearance: Normal appearance.  HENT:     Head: Normocephalic and atraumatic.  Eyes:     General: No scleral icterus.       Right eye: No discharge.        Left eye: No discharge.     Extraocular Movements: Extraocular movements intact.     Conjunctiva/sclera: Conjunctivae normal.     Pupils: Pupils are equal, round, and reactive to light.  Cardiovascular:     Rate and Rhythm: Normal rate.     Pulses: Normal pulses.  Musculoskeletal:     Comments: Right shoulder.  She is not really tender anywhere in the [place] joint spine the anterolateral acromion is fine the posterior acromion is fine.  I guess I would say she has a little bit of deltoid tenderness.  Most of her symptoms are at 120 degrees of flexion and at 90 degrees abduction with positive impingement sign.  Rotator cuff is intact with grade 5 strength.  There is no instability.    Skin:    General: Skin is warm and dry.     Capillary Refill: Capillary refill takes less than 2  seconds.  Neurological:     General: No focal deficit present.     Mental Status: She is alert and oriented to person, place, and time.  Psychiatric:        Mood and Affect: Mood normal.        Behavior: Behavior normal.        Thought Content: Thought content normal.        Judgment: Judgment normal.     Imaging was done in-house.  No fracture dislocation or arthritis is seen.  Bone quality looks good.  Diagnosis  Encounter Diagnoses  Name Primary?   Radicular pain of right upper extremity    Acute pain of right shoulder    Impingement syndrome of right shoulder Yes    Commend subacromial injection and home exercise program follow-up in 5 weeks   Procedure note the subacromial injection shoulder RIGHT    Verbal consent was obtained to inject the  RIGHT   Shoulder  Timeout was completed to confirm the injection site is a subacromial space of the  RIGHT  shoulder   Medication used Depo-Medrol 40 mg and lidocaine 1% 3 cc  Anesthesia was provided by ethyl chloride  The injection was performed in the RIGHT  posterior subacromial space. After pinning the skin with alcohol and anesthetized  the skin with ethyl chloride the subacromial space was injected using a 20-gauge needle. There were no complications  Sterile dressing was applied.

## 2023-01-12 ENCOUNTER — Ambulatory Visit (HOSPITAL_BASED_OUTPATIENT_CLINIC_OR_DEPARTMENT_OTHER): Payer: Medicare Other | Admitting: Anesthesiology

## 2023-01-12 ENCOUNTER — Ambulatory Visit (HOSPITAL_COMMUNITY): Payer: Medicare Other | Admitting: Anesthesiology

## 2023-01-12 ENCOUNTER — Encounter (HOSPITAL_COMMUNITY)
Admission: RE | Disposition: A | Discharge status: Home / Self Care | Payer: Self-pay | Source: Home / Self Care | Attending: Internal Medicine

## 2023-01-12 ENCOUNTER — Encounter (HOSPITAL_COMMUNITY): Payer: Self-pay

## 2023-01-12 ENCOUNTER — Ambulatory Visit (HOSPITAL_COMMUNITY)
Admission: RE | Admit: 2023-01-12 | Discharge: 2023-01-12 | Disposition: A | Discharge status: Home / Self Care | Payer: Medicare Other | Attending: Internal Medicine | Admitting: Internal Medicine

## 2023-01-12 ENCOUNTER — Other Ambulatory Visit: Payer: Self-pay

## 2023-01-12 DIAGNOSIS — K648 Other hemorrhoids: Secondary | ICD-10-CM | POA: Diagnosis not present

## 2023-01-12 DIAGNOSIS — I10 Essential (primary) hypertension: Secondary | ICD-10-CM | POA: Diagnosis not present

## 2023-01-12 DIAGNOSIS — E119 Type 2 diabetes mellitus without complications: Secondary | ICD-10-CM | POA: Insufficient documentation

## 2023-01-12 DIAGNOSIS — D125 Benign neoplasm of sigmoid colon: Secondary | ICD-10-CM

## 2023-01-12 DIAGNOSIS — D120 Benign neoplasm of cecum: Secondary | ICD-10-CM | POA: Diagnosis not present

## 2023-01-12 DIAGNOSIS — K219 Gastro-esophageal reflux disease without esophagitis: Secondary | ICD-10-CM | POA: Diagnosis not present

## 2023-01-12 DIAGNOSIS — Z1211 Encounter for screening for malignant neoplasm of colon: Secondary | ICD-10-CM | POA: Diagnosis not present

## 2023-01-12 DIAGNOSIS — Z7984 Long term (current) use of oral hypoglycemic drugs: Secondary | ICD-10-CM | POA: Insufficient documentation

## 2023-01-12 DIAGNOSIS — Z780 Asymptomatic menopausal state: Secondary | ICD-10-CM | POA: Diagnosis not present

## 2023-01-12 HISTORY — PX: COLONOSCOPY WITH PROPOFOL: SHX5780

## 2023-01-12 HISTORY — PX: POLYPECTOMY: SHX5525

## 2023-01-12 LAB — GLUCOSE, CAPILLARY: Glucose-Capillary: 112 mg/dL — ABNORMAL HIGH (ref 70–99)

## 2023-01-12 SURGERY — COLONOSCOPY WITH PROPOFOL
Anesthesia: General

## 2023-01-12 MED ORDER — LIDOCAINE HCL (CARDIAC) PF 100 MG/5ML IV SOSY
PREFILLED_SYRINGE | INTRAVENOUS | Status: DC | PRN
  Administered 2023-01-12: 50 mg via INTRATRACHEAL

## 2023-01-12 MED ORDER — PROPOFOL 500 MG/50ML IV EMUL
INTRAVENOUS | Status: DC | PRN
  Administered 2023-01-12: 200 ug/kg/min via INTRAVENOUS

## 2023-01-12 MED ORDER — STERILE WATER FOR IRRIGATION IR SOLN
Status: DC | PRN
  Administered 2023-01-12: 120 mL

## 2023-01-12 MED ORDER — PROPOFOL 10 MG/ML IV BOLUS
INTRAVENOUS | Status: DC | PRN
  Administered 2023-01-12: 110 mg via INTRAVENOUS

## 2023-01-12 MED ORDER — LACTATED RINGERS IV SOLN: INTRAVENOUS | Status: DC

## 2023-01-12 NOTE — Anesthesia Preprocedure Evaluation (Signed)
Anesthesia Evaluation  Patient identified by MRN, date of birth, ID band Patient awake    Reviewed: Allergy & Precautions, H&P , NPO status , Patient's Chart, lab work & pertinent test results  History of Anesthesia Complications (+) PONV and history of anesthetic complications  Airway Mallampati: III  TM Distance: >3 FB Neck ROM: Full    Dental  (+) Dental Advisory Given, Caps, Missing Bridges:   Pulmonary neg pulmonary ROS   Pulmonary exam normal breath sounds clear to auscultation       Cardiovascular Exercise Tolerance: Good hypertension, Pt. on medications Normal cardiovascular exam Rhythm:Regular Rate:Normal     Neuro/Psych  Neuromuscular disease  negative psych ROS   GI/Hepatic Neg liver ROS,GERD  Controlled,,  Endo/Other  diabetes, Well Controlled, Type 2, Oral Hypoglycemic Agents    Renal/GU negative Renal ROS  negative genitourinary   Musculoskeletal negative musculoskeletal ROS (+)    Abdominal   Peds negative pediatric ROS (+)  Hematology negative hematology ROS (+)   Anesthesia Other Findings   Reproductive/Obstetrics negative OB ROS                             Anesthesia Physical Anesthesia Plan  ASA: 2  Anesthesia Plan: General   Post-op Pain Management: Minimal or no pain anticipated   Induction: Intravenous  PONV Risk Score and Plan: 1 and Propofol infusion  Airway Management Planned: Nasal Cannula and Natural Airway  Additional Equipment:   Intra-op Plan:   Post-operative Plan:   Informed Consent: I have reviewed the patients History and Physical, chart, labs and discussed the procedure including the risks, benefits and alternatives for the proposed anesthesia with the patient or authorized representative who has indicated his/her understanding and acceptance.     Dental advisory given  Plan Discussed with: CRNA and Surgeon  Anesthesia Plan  Comments:        Anesthesia Quick Evaluation

## 2023-01-12 NOTE — Op Note (Signed)
[facility] Patient Name: Mary Vazquez Procedure Date: 01/12/2023 10:02 AM MRN: [id number] Date of Birth: [birth year] Attending MD: Elon Alas. Abbey Chatters , [place], [id number] CSN: [id number] Age: 67 Admit Type: Outpatient Procedure:                Colonoscopy Indications:              Screening for colorectal malignant neoplasm Providers:                Elon Alas. Abbey Chatters, DO, Lambert Mody, Illene Labrador Referring MD:              Medicines:                See the Anesthesia note for documentation of the                            administered medications Complications:            No immediate complications. Estimated Blood Loss:     Estimated blood loss was minimal. Procedure:                Pre-Anesthesia Assessment:                           - The anesthesia plan was to use monitored                            anesthesia care (MAC).                           After obtaining informed consent, the colonoscope                            was passed under direct vision. Throughout the                            procedure, the patient's blood pressure, pulse, and                            oxygen saturations were monitored continuously. The                            [id number]-HQ190L ([id number]) scope was introduced through                            the anus and advanced to the the cecum, identified                            by appendiceal orifice and ileocecal valve. The                            colonoscopy was performed without difficulty. The                            patient tolerated the procedure well. The quality  of the bowel preparation was evaluated using the                            BBPS [facility] Bowel Preparation Scale) with scores                            of: Right Colon = 3, Transverse Colon = 3 and Left                            Colon = 3 (entire mucosa seen well with no residual                             staining, small fragments of stool or opaque                            liquid). The total BBPS score equals 9. Scope In: 10:11:55 AM Scope Out: 10:33:19 AM Scope Withdrawal Time: 0 hours 17 minutes 16 seconds  Total Procedure Duration: 0 hours 21 minutes 24 seconds  Findings:      The perianal and digital rectal examinations were normal.      Non-bleeding internal hemorrhoids were found during endoscopy.      A 7 mm polyp was found in the cecum. The polyp was sessile. The polyp       was removed with a cold snare. Resection and retrieval were complete.      A 4 mm polyp was found in the sigmoid colon. The polyp was sessile. The       polyp was removed with a cold snare. Resection and retrieval were       complete.      The exam was otherwise without abnormality. Impression:               - Non-bleeding internal hemorrhoids.                           - One 7 mm polyp in the cecum, removed with a cold                            snare. Resected and retrieved.                           - One 4 mm polyp in the sigmoid colon, removed with                            a cold snare. Resected and retrieved.                           - The examination was otherwise normal. Moderate Sedation:      Per Anesthesia Care Recommendation:           - Patient has a contact number available for                            emergencies. The signs and symptoms of potential  delayed complications were discussed with the                            patient. Return to normal activities tomorrow.                            Written discharge instructions were provided to the                            patient.                           - Resume previous diet.                           - Continue present medications.                           - Await pathology results.                           - Repeat colonoscopy in 7 years for surveillance.                           - Return to GI  clinic PRN. Procedure Code(s):        --- Professional ---                           [phone], Colonoscopy, flexible; with removal of                            tumor(s), polyp(s), or other lesion(s) by snare                            technique Diagnosis Code(s):        --- Professional ---                           Z12.11, Encounter for screening for malignant                            neoplasm of colon                           D12.0, Benign neoplasm of cecum                           D12.5, Benign neoplasm of sigmoid colon                           K64.8, Other hemorrhoids CPT copyright 2022 American Medical Association. All rights reserved. The codes documented in this report are preliminary and upon coder review may  be revised to meet current compliance requirements. Elon Alas. Abbey Chatters, DO [place] Abbey Chatters, DO 01/12/2023 10:36:56 AM This report has been signed electronically. Number of Addenda: 0

## 2023-01-12 NOTE — H&P (Signed)
Primary Care Physician:  Coral Spikes, DO Primary Gastroenterologist:  Dr. Abbey Chatters  Pre-Procedure History & Physical: HPI:  Mary Vazquez is a 67 y.o. female is here for a colonoscopy for colon cancer screening purposes.  Patient denies any family history of colorectal cancer.  No melena or hematochezia.  No abdominal pain or unintentional weight loss.  No change in bowel habits.  Overall feels well from a GI standpoint.  Past Medical History:  Diagnosis Date   Diabetes ([place])    Hypertension    PONV (postoperative nausea and vomiting)     Past Surgical History:  Procedure Laterality Date   COLONOSCOPY   08/08/2007   SLF:  A 5-mm sessile descending colon polyp removed via cold forceps.Normal retroflexed view of the rectum/ Small internal hemorrhoids, PATH: tubular adenoma   COLONOSCOPY  01/09/2013   Procedure: COLONOSCOPY;  Surgeon: Danie Binder, MD;  Location: [facility];  Service: Endoscopy;  Laterality: N/A;  8:30   TUBAL LIGATION      Prior to Admission medications   Medication Sig Start Date End Date Taking? Authorizing Provider  atorvastatin (LIPITOR) 20 MG tablet Take 1 tablet (20 mg total) by mouth daily. Patient taking differently: Take 20 mg by mouth every evening. 11/24/22  Yes Cook, Jayce G, DO  cetirizine (ZYRTEC) 10 MG tablet Take 10 mg by mouth daily as needed for allergies.   Yes [provider]  enalapril (VASOTEC) 20 MG tablet Take 1 tablet (20 mg total) by mouth daily. Patient taking differently: Take 20 mg by mouth every evening. 11/24/22  Yes Cook, Jayce G, DO  glipiZIDE (GLUCOTROL) 5 MG tablet Take 1 tablet (5 mg total) by mouth daily before breakfast. 01/06/23  Yes Reardon, Juanetta Beets, NP  MAGNESIUM PO Take 260 mg by mouth daily.   Yes [provider]  metFORMIN (GLUCOPHAGE) 500 MG tablet Take 2 tablets (1,000 mg total) by mouth 2 (two) times daily with a meal. 01/06/23 02/05/23 Yes Reardon, Juanetta Beets, NP  Polyethyl Glycol-Propyl Glycol  (SYSTANE OP) Apply to eye.   Yes [provider]  Propylene Glycol (SYSTANE BALANCE) 0.6 % SOLN Place 1 drop into both eyes daily as needed (dry eyes).   Yes [provider]  acetaminophen (TYLENOL) 650 MG CR tablet Take 1,300 mg by mouth every 8 (eight) hours as needed for pain.    [provider]  Cholecalciferol (VITAMIN D3) LIQD Take 2,000 Units by mouth daily.    [provider]  Probiotic Product (PROBIOTIC PO) Take 1 capsule by mouth daily.    [provider]    Allergies as of 01/07/2023 - Review Complete 01/07/2023  Allergen Reaction Noted   Ozempic (0.25 or 0.5 mg-dose) [semaglutide(0.25 or 0.'5mg'$ -dos)] Nausea And Vomiting 08/26/2022   Septra [sulfamethoxazole-trimethoprim] Swelling and Rash 01/09/2013    Family History  Problem Relation Age of Onset   Cancer Father        prostate   Diabetes Sister    Diabetes Maternal Grandmother    Diabetes Paternal Aunt    Colon cancer Neg Hx     Social History   Socioeconomic History   Marital status: Widowed    Spouse name: Not on file   Number of children: 3   Years of education: Not on file   Highest education level: Not on file  Occupational History   Not on file  Tobacco Use   Smoking status: Never   Smokeless tobacco: Never  Vaping Use   Vaping  Use: Never used  Substance and Sexual Activity   Alcohol use: No   Drug use: No   Sexual activity: Not Currently    Birth control/protection: Post-menopausal, Surgical    Comment: tubal  Other Topics Concern   Not on file  Social History Narrative   Husband passed 01/2020.   3 children    2 grandchildren   Social Determinants of Health   Financial Resource Strain: Low Risk  (01/01/2023)   Overall Financial Resource Strain (CARDIA)    Difficulty of Paying Living Expenses: Not hard at all  Food Insecurity: No Food Insecurity (01/01/2023)   Hunger Vital Sign    Worried About Running Out of Food in the Last Year: Never true     Ran Out of Food in the Last Year: Never true  Transportation Needs: No Transportation Needs (01/01/2023)   PRAPARE - Hydrologist (Medical): No    Lack of Transportation (Non-Medical): No  Physical Activity: Sufficiently Active (01/01/2023)   Exercise Vital Sign    Days of Exercise per Week: 5 days    Minutes of Exercise per Session: 30 min  Stress: No Stress Concern Present (01/01/2023)   [place]    Feeling of Stress : Not at all  Social Connections: Moderately Isolated (12/31/2022)   Social Connection and Isolation Panel [NHANES]    Frequency of Communication with Friends and Family: More than three times a week    Frequency of Social Gatherings with Friends and Family: Once a week    Attends Religious Services: More than 4 times per year    Active Member of [employer] or Organizations: No    Attends Archivist Meetings: Never    Marital Status: Widowed  Intimate Partner Violence: Not At Risk (01/01/2023)   Humiliation, Afraid, Rape, and Kick questionnaire    Fear of Current or Ex-Partner: No    Emotionally Abused: No    Physically Abused: No    Sexually Abused: No    Review of Systems: See HPI, otherwise negative ROS  Physical Exam: Vital signs in last 24 hours: Temp:  [97.6 F (36.4 C)] 97.6 F (36.4 C) (01/30 0849) Pulse Rate:  [66] 66 (01/30 0849) Resp:  [18] 18 (01/30 0849) BP: (175)/(86) 175/86 (01/30 0849) SpO2:  [100 %] 100 % (01/30 0849) Weight:  [68 kg] 68 kg (01/30 0849)   General:   Alert,  Well-developed, well-nourished, pleasant and cooperative in NAD Head:  Normocephalic and atraumatic. Eyes:  Sclera clear, no icterus.   Conjunctiva pink. Ears:  Normal auditory acuity. Nose:  No deformity, discharge,  or lesions. Msk:  Symmetrical without gross deformities. Normal posture. Extremities:  Without clubbing or edema. Neurologic:  Alert and  oriented x4;   grossly normal neurologically. Skin:  Intact without significant lesions or rashes. Psych:  Alert and cooperative. Normal mood and affect.  Impression/Plan: Mary Vazquez is here for a colonoscopy to be performed for colon cancer screening purposes.  The risks of the procedure including infection, bleed, or perforation as well as benefits, limitations, alternatives and imponderables have been reviewed with the patient. Questions have been answered. All parties agreeable.

## 2023-01-12 NOTE — Discharge Instructions (Addendum)
  Colonoscopy Discharge Instructions  Read the instructions outlined below and refer to this sheet in the next few weeks. These discharge instructions provide you with general information on caring for yourself after you leave the hospital. Your doctor may also give you specific instructions. While your treatment has been planned according to the most current medical practices available, unavoidable complications occasionally occur.   ACTIVITY You may resume your regular activity, but move at a slower pace for the next 24 hours.  Take frequent rest periods for the next 24 hours.  Walking will help get rid of the air and reduce the bloated feeling in your belly (abdomen).  No driving for 24 hours (because of the medicine (anesthesia) used during the test).   Do not sign any important legal documents or operate any machinery for 24 hours (because of the anesthesia used during the test).  NUTRITION Drink plenty of fluids.  You may resume your normal diet as instructed by your doctor.  Begin with a light meal and progress to your normal diet. Heavy or fried foods are harder to digest and may make you feel sick to your stomach (nauseated).  Avoid alcoholic beverages for 24 hours or as instructed.  MEDICATIONS You may resume your normal medications unless your doctor tells you otherwise.  WHAT YOU CAN EXPECT TODAY Some feelings of bloating in the abdomen.  Passage of more gas than usual.  Spotting of blood in your stool or on the toilet paper.  IF YOU HAD POLYPS REMOVED DURING THE COLONOSCOPY: No aspirin products for 7 days or as instructed.  No alcohol for 7 days or as instructed.  Eat a soft diet for the next 24 hours.  FINDING OUT THE RESULTS OF YOUR TEST Not all test results are available during your visit. If your test results are not back during the visit, make an appointment with your caregiver to find out the results. Do not assume everything is normal if you have not heard from your  caregiver or the medical facility. It is important for you to follow up on all of your test results.  SEEK IMMEDIATE MEDICAL ATTENTION IF: You have more than a spotting of blood in your stool.  Your belly is swollen (abdominal distention).  You are nauseated or vomiting.  You have a temperature over 101.  You have abdominal pain or discomfort that is severe or gets worse throughout the day.   Your colonoscopy revealed 2 polyp(s) which I removed successfully. Await pathology results, my office will contact you. I recommend repeating colonoscopy in 7 years for surveillance purposes. Otherwise follow up with GI as needed.    I hope you have a great rest of your week!  Elon Alas. Abbey Chatters, D.O. Gastroenterology and Hepatology [facility]

## 2023-01-12 NOTE — Transfer of Care (Signed)
Immediate Anesthesia Transfer of Care Note  Patient: AARADHYA KYSAR  Procedure(s) Performed: COLONOSCOPY WITH PROPOFOL POLYPECTOMY  Patient Location: Endoscopy Unit  Anesthesia Type:General  Level of Consciousness: awake, alert , oriented, and patient cooperative  Airway & Oxygen Therapy: Patient Spontanous Breathing  Post-op Assessment: Report given to RN, Post -op Vital signs reviewed and stable, and Patient moving all extremities  Post vital signs: Reviewed and stable  Last Vitals:  Vitals Value Taken Time  BP 121/70 01/12/23 1036  Temp 36.7 C 01/12/23 1036  Pulse 75 01/12/23 1036  Resp 19 01/12/23 1036  SpO2 100 % 01/12/23 1036    Last Pain:  Vitals:   01/12/23 1036  TempSrc: Axillary  PainSc: 0-No pain      Patients Stated Pain Goal: 9 (44/58/48 3507)  Complications: No notable events documented.

## 2023-01-12 NOTE — Anesthesia Postprocedure Evaluation (Signed)
Anesthesia Post Note  Patient: Mary Vazquez  Procedure(s) Performed: COLONOSCOPY WITH PROPOFOL POLYPECTOMY  Patient location during evaluation: Phase II Anesthesia Type: General Level of consciousness: awake and alert and oriented Pain management: pain level controlled Vital Signs Assessment: post-procedure vital signs reviewed and stable Respiratory status: spontaneous breathing, nonlabored ventilation and respiratory function stable Cardiovascular status: blood pressure returned to baseline and stable Postop Assessment: no apparent nausea or vomiting Anesthetic complications: no  No notable events documented.   Last Vitals:  Vitals:   01/12/23 0849 01/12/23 1036  BP: (!) 175/86 121/70  Pulse: 66 75  Resp: 18 19  Temp: 36.4 C 36.7 C  SpO2: 100% 100%    Last Pain:  Vitals:   01/12/23 1036  TempSrc: Axillary  PainSc: 0-No pain                 Taneshia Lorence C Cylie Dor

## 2023-01-14 LAB — SURGICAL PATHOLOGY

## 2023-01-18 ENCOUNTER — Encounter (HOSPITAL_COMMUNITY): Payer: Self-pay | Admitting: Internal Medicine

## 2023-02-11 ENCOUNTER — Encounter: Payer: Self-pay | Admitting: Radiology

## 2023-02-11 ENCOUNTER — Encounter: Payer: Self-pay | Admitting: Orthopedic Surgery

## 2023-02-11 ENCOUNTER — Ambulatory Visit: Payer: Medicare Other | Admitting: Orthopedic Surgery

## 2023-02-11 DIAGNOSIS — M792 Neuralgia and neuritis, unspecified: Secondary | ICD-10-CM

## 2023-02-11 DIAGNOSIS — M25511 Pain in right shoulder: Secondary | ICD-10-CM

## 2023-02-11 DIAGNOSIS — M7541 Impingement syndrome of right shoulder: Secondary | ICD-10-CM

## 2023-02-11 NOTE — Progress Notes (Signed)
Chief Complaint  Patient presents with   Shoulder Pain    Right /feeling better     Encounter Diagnoses  Name Primary?   Acute pain of right shoulder Yes   Impingement syndrome of right shoulder    Radicular pain of right upper extremity     Mary Vazquez has made significant improvements in the pain in her right shoulder.  She still has some mild residual symptoms.  The radicular pain in the right upper extremity has resolved.  She has good strength in the rotator cuff with manual muscle testing with some pain with abduction against resistance.  She has some decreased internal rotation but this has improved  Recommend over-the-counter medication as needed continue exercises follow-up if the pain worsens status post injection

## 2023-02-19 ENCOUNTER — Ambulatory Visit
Admission: EM | Admit: 2023-02-19 | Discharge: 2023-02-19 | Disposition: A | Discharge status: Home / Self Care | Payer: Medicare Other | Attending: Family Medicine | Admitting: Family Medicine

## 2023-02-19 DIAGNOSIS — R739 Hyperglycemia, unspecified: Secondary | ICD-10-CM | POA: Diagnosis present

## 2023-02-19 DIAGNOSIS — E1165 Type 2 diabetes mellitus with hyperglycemia: Secondary | ICD-10-CM | POA: Insufficient documentation

## 2023-02-19 DIAGNOSIS — N390 Urinary tract infection, site not specified: Secondary | ICD-10-CM | POA: Diagnosis present

## 2023-02-19 LAB — POCT URINALYSIS DIP (MANUAL ENTRY)
Bilirubin, UA: NEGATIVE
Glucose, UA: >=1000 mg/dL — AB
Nitrite, UA: NEGATIVE
Protein Ur, POC: NEGATIVE mg/dL
Spec Grav, UA: 1.01 (ref 1.010–1.025)
Urobilinogen, UA: 0.2 E.U./dL
pH, UA: 5.5 (ref 5.0–8.0)

## 2023-02-19 MED ORDER — CEPHALEXIN 500 MG PO CAPS: 500.0000 mg | ORAL_CAPSULE | Freq: Two times a day (BID) | ORAL | 0 refills | Status: DC

## 2023-02-19 NOTE — Discharge Instructions (Signed)
I suspect that your elevated blood sugars are largely responsible for your symptoms and possibly causing a UTI.  Because you did have some bacteria in your urine today, I have sent over a course of antibiotics for you to take while we wait for your urine culture to get more information.  Someone will call you if we need to change her medications for any reason based on your urine culture results once they are back.  Follow-up with your endocrinologist regarding your poorly controlled blood sugars.

## 2023-02-19 NOTE — ED Triage Notes (Signed)
Pt reports frequent urinate, slight lower abdomen pain, and painful urination x 1 week.  Took fluconazole but no relief. Pt has a script of refills for it from doctor for when the UTI occur.

## 2023-02-20 ENCOUNTER — Encounter: Payer: Self-pay | Admitting: Nurse Practitioner

## 2023-02-20 NOTE — ED Provider Notes (Signed)
[facility]    CSN: [id number] Arrival date & time: 02/19/23  0840      History   Chief Complaint Chief Complaint  Patient presents with   Recurrent UTI    HPI Mary Vazquez is a 67 y.o. female.   Presenting today with 1 week history of urinary frequency, lower abdominal discomfort, dysuria. Denies fever, chills, flank pain, hematuria, vaginal sxs. Tried some fluconazole and a prescription from her doctor for when she feels she has a UTI with no relief. Of note, has a hx of DM currently with multiple medication adjustments and hyperglycemia often in the high 200s recently.     Past Medical History:  Diagnosis Date   Diabetes ([place])    Hypertension    PONV (postoperative nausea and vomiting)     Patient Active Problem List   Diagnosis Date Noted   Trapezius muscle strain 11/24/2022   Lipoma 08/26/2022   Hand pain 05/19/2022   Hyperlipidemia 11/18/2021   Hypertension 03/02/2013   Diabetes ([place]) 03/02/2013   GERD (gastroesophageal reflux disease) 03/02/2013    Past Surgical History:  Procedure Laterality Date   COLONOSCOPY   08/08/2007   SLF:  A 5-mm sessile descending colon polyp removed via cold forceps.Normal retroflexed view of the rectum/ Small internal hemorrhoids, PATH: tubular adenoma   COLONOSCOPY  01/09/2013   Procedure: COLONOSCOPY;  Surgeon: Danie Binder, MD;  [facility];  Service: Endoscopy;  Laterality: N/A;  8:30   COLONOSCOPY WITH PROPOFOL N/A 01/12/2023   Procedure: COLONOSCOPY WITH PROPOFOL;  Surgeon: Eloise Harman, DO;  [facility];  Service: Endoscopy;  Laterality: N/A;  10:30am., asa 2   POLYPECTOMY  01/12/2023   Procedure: POLYPECTOMY;  Surgeon: Eloise Harman, DO;  [facility];  Service: Endoscopy;;   TUBAL LIGATION      OB History     Gravida  3   Para  3   Term  3   Preterm      AB      Living         SAB      IAB      Ectopic      Multiple      Live Births                Home Medications    Prior to Admission medications   Medication Sig Start Date End Date Taking? Authorizing Provider  cephALEXin (KEFLEX) 500 MG capsule Take 1 capsule (500 mg total) by mouth 2 (two) times daily. 02/19/23  Yes Volney American, PA-C  acetaminophen (TYLENOL) 650 MG CR tablet Take 1,300 mg by mouth every 8 (eight) hours as needed for pain.    [provider]  atorvastatin (LIPITOR) 20 MG tablet Take 1 tablet (20 mg total) by mouth daily. Patient taking differently: Take 20 mg by mouth every evening. 11/24/22   Coral Spikes, DO  cetirizine (ZYRTEC) 10 MG tablet Take 10 mg by mouth daily as needed for allergies.    [provider]  Cholecalciferol (VITAMIN D3) LIQD Take 2,000 Units by mouth daily.    [provider]  enalapril (VASOTEC) 20 MG tablet Take 1 tablet (20 mg total) by mouth daily. Patient taking differently: Take 20 mg by mouth every evening. 11/24/22   Coral Spikes, DO  glipiZIDE (GLUCOTROL) 5 MG tablet Take 1 tablet (5 mg total) by mouth daily before breakfast. 01/06/23   Brita Romp, NP  MAGNESIUM PO Take 260 mg by mouth daily.    [provider]  metFORMIN (GLUCOPHAGE) 500 MG tablet Take 2 tablets (1,000 mg total) by mouth 2 (two) times daily with a meal. 01/06/23 02/05/23  Brita Romp, NP  Polyethyl Glycol-Propyl Glycol (SYSTANE OP) Apply to eye.    [provider]  Probiotic Product (PROBIOTIC PO) Take 1 capsule by mouth daily.    [provider]  Propylene Glycol (SYSTANE BALANCE) 0.6 % SOLN Place 1 drop into both eyes daily as needed (dry eyes).    [provider]    Family History Family History  Problem Relation Age of Onset   Cancer Father        prostate   Diabetes Sister    Diabetes Maternal Grandmother    Diabetes Paternal Aunt    Colon cancer Neg Hx     Social History Social History   Tobacco Use   Smoking status: Never   Smokeless tobacco:  Never  Vaping Use   Vaping Use: Never used  Substance Use Topics   Alcohol use: No   Drug use: No     Allergies   Ozempic (0.25 or 0.5 mg-dose) [semaglutide(0.25 or 0.'5mg'$ -dos)] and Septra [sulfamethoxazole-trimethoprim]   Review of Systems Review of Systems PER HPI  Physical Exam Triage Vital Signs ED Triage Vitals  Enc Vitals Group     BP 02/19/23 0946 (!) 147/84     Pulse Rate 02/19/23 0946 (!) 109     Resp --      Temp 02/19/23 0946 98.9 F (37.2 C)     Temp Source 02/19/23 0946 Oral     SpO2 --      Weight --      Height --      Head Circumference --      Peak Flow --      Pain Score 02/19/23 0950 3     Pain Loc --      Pain Edu? --      Excl. in [place]? --    No data found.  Updated Vital Signs BP (!) 147/84 (BP Location: Right Arm)   Pulse (!) 109   Temp 98.9 F (37.2 C) (Oral)   Visual Acuity Right Eye Distance:   Left Eye Distance:   Bilateral Distance:    Right Eye Near:   Left Eye Near:    Bilateral Near:     Physical Exam Vitals and nursing note reviewed.  Constitutional:      Appearance: Normal appearance. She is not ill-appearing.  HENT:     Head: Atraumatic.  Eyes:     Extraocular Movements: Extraocular movements intact.     Conjunctiva/sclera: Conjunctivae normal.  Cardiovascular:     Rate and Rhythm: Normal rate and regular rhythm.     Heart sounds: Normal heart sounds.  Pulmonary:     Effort: Pulmonary effort is normal.     Breath sounds: Normal breath sounds.  Abdominal:     General: Bowel sounds are normal. There is no distension.     Palpations: Abdomen is soft.     Tenderness: There is no abdominal tenderness. There is no right CVA tenderness, left CVA tenderness or guarding.  Musculoskeletal:        General: Normal range of motion.     Cervical back: Normal range of motion and neck supple.  Skin:    General: Skin is warm and dry.  Neurological:     Mental Status: She is alert and  oriented to person, place, and time.   Psychiatric:        Mood and Affect: Mood normal.        Thought Content: Thought content normal.        Judgment: Judgment normal.      UC Treatments / Results  Labs (all labs ordered are listed, but only abnormal results are displayed) Labs Reviewed  POCT URINALYSIS DIP (MANUAL ENTRY) - Abnormal; Notable for the following components:      Result Value   Clarity, UA cloudy (*)    Glucose, UA >=1,000 (*)    Ketones, POC UA moderate (40) (*)    Blood, UA trace-intact (*)    Leukocytes, UA Trace (*)    All other components within normal limits  URINE CULTURE    EKG   Radiology No results found.  Procedures Procedures (including critical care time)  Medications Ordered in UC Medications - No data to display  Initial Impression / Assessment and Plan / UC Course  I have reviewed the triage vital signs and the nursing notes.  Pertinent labs & imaging results that were available during my care of the patient were reviewed by me and considered in my medical decision making (see chart for details).     U/A with evidence of possible UTI, start empiric abx while awaiting urine culture for confirmation. Suspect uncontrolled BSs are a large contributing factor so discussed close f/u with Endocrinologist regarding sugars being elevated and continued efforts with diet and exercise. F/u for worsening sxs.  Final Clinical Impressions(s) / UC Diagnoses   Final diagnoses:  Acute lower UTI  Hyperglycemia  Uncontrolled type 2 diabetes mellitus with hyperglycemia [facility])     Discharge Instructions      I suspect that your elevated blood sugars are largely responsible for your symptoms and possibly causing a UTI.  Because you did have some bacteria in your urine today, I have sent over a course of antibiotics for you to take while we wait for your urine culture to get more information.  Someone will call you if we need to change her medications for any reason based on your urine  culture results once they are back.  Follow-up with your endocrinologist regarding your poorly controlled blood sugars.    ED Prescriptions     Medication Sig Dispense Auth. Provider   cephALEXin (KEFLEX) 500 MG capsule Take 1 capsule (500 mg total) by mouth 2 (two) times daily. 10 capsule Volney American, [place]      PDMP not reviewed this encounter.   Merrie Roof [place], [place] 02/20/23 [phone]

## 2023-02-22 LAB — URINE CULTURE: Culture: >=100000 — AB

## 2023-02-26 ENCOUNTER — Ambulatory Visit
Admission: EM | Admit: 2023-02-26 | Discharge: 2023-02-26 | Disposition: A | Discharge status: Home / Self Care | Payer: Medicare Other | Attending: Family Medicine | Admitting: Family Medicine

## 2023-02-26 DIAGNOSIS — N390 Urinary tract infection, site not specified: Secondary | ICD-10-CM | POA: Diagnosis present

## 2023-02-26 DIAGNOSIS — E1165 Type 2 diabetes mellitus with hyperglycemia: Secondary | ICD-10-CM | POA: Diagnosis not present

## 2023-02-26 LAB — POCT URINALYSIS DIP (MANUAL ENTRY)
Bilirubin, UA: NEGATIVE
Glucose, UA: >=1000 mg/dL — AB
Ketones, POC UA: NEGATIVE mg/dL
Nitrite, UA: POSITIVE — AB
Protein Ur, POC: 30 mg/dL — AB
Spec Grav, UA: 1.02 (ref 1.010–1.025)
Urobilinogen, UA: 0.2 E.U./dL
pH, UA: 5.5 (ref 5.0–8.0)

## 2023-02-26 MED ORDER — NITROFURANTOIN MONOHYD MACRO 100 MG PO CAPS: 100.0000 mg | ORAL_CAPSULE | Freq: Two times a day (BID) | ORAL | 0 refills | Status: DC

## 2023-02-26 NOTE — ED Triage Notes (Signed)
Pt reports lightheaded x 1 week when she had UTI; burning and discomfort when urinating started today. Reports she finished treatment for UTI 2 days ago.   Blood sugar level was 353 fasting.

## 2023-02-26 NOTE — ED Provider Notes (Signed)
[facility]    CSN: [id number] Arrival date & time: 02/26/23  1506      History   Chief Complaint Chief Complaint  Patient presents with   Dysuria         HPI Mary Vazquez is a 67 y.o. female.   Patient presenting today with 1 day history of dysuria, malaise, suprapubic pressure.  States she was diagnosed about a week ago with a urinary tract infection, treated with 5 days of Keflex and felt that she was improving until the day after she finished her medications 2 days ago.  She states symptoms have all returned and possibly worsened.  Denies fever, chills, flank pain, nausea, vomiting, bowel changes.  Not currently taking anything for symptoms.  She is also struggling with poorly controlled diabetes, fasting blood sugar this morning was 353.  Working with endocrinology on this.    Past Medical History:  Diagnosis Date   Diabetes ([place])    Hypertension    PONV (postoperative nausea and vomiting)     Patient Active Problem List   Diagnosis Date Noted   Trapezius muscle strain 11/24/2022   Lipoma 08/26/2022   Hand pain 05/19/2022   Hyperlipidemia 11/18/2021   Hypertension 03/02/2013   Diabetes ([place]) 03/02/2013   GERD (gastroesophageal reflux disease) 03/02/2013    Past Surgical History:  Procedure Laterality Date   COLONOSCOPY   08/08/2007   SLF:  A 5-mm sessile descending colon polyp removed via cold forceps.Normal retroflexed view of the rectum/ Small internal hemorrhoids, PATH: tubular adenoma   COLONOSCOPY  01/09/2013   Procedure: COLONOSCOPY;  Surgeon: Danie Binder, MD;  [facility];  Service: Endoscopy;  Laterality: N/A;  8:30   COLONOSCOPY WITH PROPOFOL N/A 01/12/2023   Procedure: COLONOSCOPY WITH PROPOFOL;  Surgeon: Eloise Harman, DO;  [facility];  Service: Endoscopy;  Laterality: N/A;  10:30am., asa 2   POLYPECTOMY  01/12/2023   Procedure: POLYPECTOMY;  Surgeon: Eloise Harman, DO;  [facility];   Service: Endoscopy;;   TUBAL LIGATION      OB History     Gravida  3   Para  3   Term  3   Preterm      AB      Living         SAB      IAB      Ectopic      Multiple      Live Births               Home Medications    Prior to Admission medications   Medication Sig Start Date End Date Taking? Authorizing Provider  nitrofurantoin, macrocrystal-monohydrate, (MACROBID) 100 MG capsule Take 1 capsule (100 mg total) by mouth 2 (two) times daily. 02/26/23  Yes Volney American, PA-C  acetaminophen (TYLENOL) 650 MG CR tablet Take 1,300 mg by mouth every 8 (eight) hours as needed for pain.    [provider]  atorvastatin (LIPITOR) 20 MG tablet Take 1 tablet (20 mg total) by mouth daily. Patient taking differently: Take 20 mg by mouth every evening. 11/24/22   Coral Spikes, DO  cephALEXin (KEFLEX) 500 MG capsule Take 1 capsule (500 mg total) by mouth 2 (two) times daily. 02/19/23   Volney American, PA-C  cetirizine (ZYRTEC) 10 MG tablet Take 10 mg by mouth daily as needed for allergies.    [provider]  Cholecalciferol (VITAMIN D3) LIQD Take 2,000  Units by mouth daily.    [provider]  enalapril (VASOTEC) 20 MG tablet Take 1 tablet (20 mg total) by mouth daily. Patient taking differently: Take 20 mg by mouth every evening. 11/24/22   Coral Spikes, DO  glipiZIDE (GLUCOTROL) 5 MG tablet Take 1 tablet (5 mg total) by mouth daily before breakfast. 01/06/23   Brita Romp, NP  MAGNESIUM PO Take 260 mg by mouth daily.    [provider]  metFORMIN (GLUCOPHAGE) 500 MG tablet Take 2 tablets (1,000 mg total) by mouth 2 (two) times daily with a meal. 01/06/23 02/05/23  Brita Romp, NP  Polyethyl Glycol-Propyl Glycol (SYSTANE OP) Apply to eye.    [provider]  Probiotic Product (PROBIOTIC PO) Take 1 capsule by mouth daily.    [provider]  Propylene Glycol (SYSTANE BALANCE) 0.6 % SOLN Place 1  drop into both eyes daily as needed (dry eyes).    [provider]    Family History Family History  Problem Relation Age of Onset   Cancer Father        prostate   Diabetes Sister    Diabetes Maternal Grandmother    Diabetes Paternal Aunt    Colon cancer Neg Hx     Social History Social History   Tobacco Use   Smoking status: Never   Smokeless tobacco: Never  Vaping Use   Vaping Use: Never used  Substance Use Topics   Alcohol use: No   Drug use: No     Allergies   Ozempic (0.25 or 0.5 mg-dose) [semaglutide(0.25 or 0.5mg -dos)] and Septra [sulfamethoxazole-trimethoprim]   Review of Systems Review of Systems PER HPI  Physical Exam Triage Vital Signs ED Triage Vitals  Enc Vitals Group     BP 02/26/23 1526 128/76     Pulse Rate 02/26/23 1526 88     Resp 02/26/23 1526 16     Temp 02/26/23 1526 97.7 F (36.5 C)     Temp Source 02/26/23 1526 Oral     SpO2 02/26/23 1526 99 %     Weight --      Height --      Head Circumference --      Peak Flow --      Pain Score 02/26/23 1530 0     Pain Loc --      Pain Edu? --      Excl. in [place]? --    No data found.  Updated Vital Signs BP 128/76 (BP Location: Right Arm)   Pulse 88   Temp 97.7 F (36.5 C) (Oral)   Resp 16   SpO2 99%   Visual Acuity Right Eye Distance:   Left Eye Distance:   Bilateral Distance:    Right Eye Near:   Left Eye Near:    Bilateral Near:     Physical Exam Vitals and nursing note reviewed.  Constitutional:      Appearance: Normal appearance. She is not ill-appearing.  HENT:     Head: Atraumatic.  Eyes:     Extraocular Movements: Extraocular movements intact.     Conjunctiva/sclera: Conjunctivae normal.  Cardiovascular:     Rate and Rhythm: Normal rate and regular rhythm.     Heart sounds: Normal heart sounds.  Pulmonary:     Effort: Pulmonary effort is normal.     Breath sounds: Normal breath sounds.  Abdominal:     General: Bowel sounds are normal. There is no  distension.  Palpations: Abdomen is soft.     Tenderness: There is no abdominal tenderness. There is no right CVA tenderness, left CVA tenderness or guarding.  Musculoskeletal:        General: Normal range of motion.     Cervical back: Normal range of motion and neck supple.  Skin:    General: Skin is warm and dry.  Neurological:     Mental Status: She is alert and oriented to person, place, and time.  Psychiatric:        Mood and Affect: Mood normal.        Thought Content: Thought content normal.        Judgment: Judgment normal.      UC Treatments / Results  Labs (all labs ordered are listed, but only abnormal results are displayed) Labs Reviewed  POCT URINALYSIS DIP (MANUAL ENTRY) - Abnormal; Notable for the following components:      Result Value   Clarity, UA hazy (*)    Glucose, UA >=1,000 (*)    Blood, UA large (*)    Protein Ur, POC =30 (*)    Nitrite, UA Positive (*)    Leukocytes, UA Small (1+) (*)    All other components within normal limits  URINE CULTURE    EKG   Radiology No results found.  Procedures Procedures (including critical care time)  Medications Ordered in UC Medications - No data to display  Initial Impression / Assessment and Plan / UC Course  I have reviewed the triage vital signs and the nursing notes.  Pertinent labs & imaging results that were available during my care of the patient were reviewed by me and considered in my medical decision making (see chart for details).     Vitals and exam overall reassuring, she appears well and in no acute distress.  Urinalysis today showing evidence of a urinary tract infection, unsure if unresolved from previous or if new infection so urine culture again sent.  Previously treated with Keflex, will treat with Macrobid at this time given her sulfa allergy and interaction between the diabetes medications and Cipro causing some safety concerns.  Her kidney function on last CMP was within normal  limits.  Discussed close PCP follow-up for recheck.  Also reviewed working closely with endocrinology ongoing for blood sugar control. Final Clinical Impressions(s) / UC Diagnoses   Final diagnoses:  Acute lower UTI  Uncontrolled type 2 diabetes mellitus with hyperglycemia [facility])   Discharge Instructions   None    ED Prescriptions     Medication Sig Dispense Auth. Provider   nitrofurantoin, macrocrystal-monohydrate, (MACROBID) 100 MG capsule Take 1 capsule (100 mg total) by mouth 2 (two) times daily. 14 capsule Volney American, [place]      PDMP not reviewed this encounter.   Volney American, [place] 02/26/23 1551

## 2023-02-28 ENCOUNTER — Encounter: Payer: Self-pay | Admitting: Family Medicine

## 2023-02-28 ENCOUNTER — Encounter: Payer: Self-pay | Admitting: Nurse Practitioner

## 2023-02-28 LAB — URINE CULTURE: Culture: >=100000 — AB

## 2023-03-01 NOTE — Telephone Encounter (Signed)
Mary Salt G, DO     Needs to be seen.

## 2023-03-02 MED ORDER — LANTUS SOLOSTAR 100 UNIT/ML ~~LOC~~ SOPN: 15.0000 [IU] | PEN_INJECTOR | Freq: Every day | SUBCUTANEOUS | 1 refills | Status: DC

## 2023-03-02 MED ORDER — PEN NEEDLES 31G X 6 MM MISC: 3 refills | Status: DC

## 2023-03-05 ENCOUNTER — Ambulatory Visit: Payer: Medicare Other | Admitting: Family Medicine

## 2023-03-05 ENCOUNTER — Encounter: Payer: Self-pay | Admitting: Family Medicine

## 2023-03-05 DIAGNOSIS — E782 Mixed hyperlipidemia: Secondary | ICD-10-CM | POA: Diagnosis not present

## 2023-03-05 DIAGNOSIS — E119 Type 2 diabetes mellitus without complications: Secondary | ICD-10-CM | POA: Diagnosis not present

## 2023-03-05 DIAGNOSIS — R300 Dysuria: Secondary | ICD-10-CM | POA: Diagnosis not present

## 2023-03-05 DIAGNOSIS — R42 Dizziness and giddiness: Secondary | ICD-10-CM

## 2023-03-05 DIAGNOSIS — I10 Essential (primary) hypertension: Secondary | ICD-10-CM | POA: Diagnosis not present

## 2023-03-05 LAB — POCT URINALYSIS DIP (CLINITEK)
Bilirubin, UA: NEGATIVE
Blood, UA: NEGATIVE
Glucose, UA: 100 mg/dL — AB
Ketones, POC UA: NEGATIVE mg/dL
Leukocytes, UA: NEGATIVE
Nitrite, UA: NEGATIVE
POC PROTEIN,UA: NEGATIVE
Spec Grav, UA: 1.01 (ref 1.010–1.025)
Urobilinogen, UA: 0.2 E.U./dL
pH, UA: 5 (ref 5.0–8.0)

## 2023-03-05 MED ORDER — ENALAPRIL MALEATE 20 MG PO TABS: 20.0000 mg | ORAL_TABLET | Freq: Every day | ORAL | 6 refills | Status: DC

## 2023-03-05 NOTE — Patient Instructions (Signed)
Labs today. Complete antibiotic.  Check BP daily.  Take care  Dr. Lacinda Axon

## 2023-03-06 LAB — LIPID PANEL
Chol/HDL Ratio: 2 ratio (ref 0.0–4.4)
Cholesterol, Total: 133 mg/dL (ref 100–199)
HDL: 67 mg/dL (ref >39)
LDL Chol Calc (NIH): 48 mg/dL (ref 0–99)
Triglycerides: 100 mg/dL (ref 0–149)
VLDL Cholesterol Cal: 18 mg/dL (ref 5–40)

## 2023-03-06 LAB — CBC
Hematocrit: 37.2 % (ref 34.0–46.6)
Hemoglobin: 12.5 g/dL (ref 11.1–15.9)
MCH: 30 pg (ref 26.6–33.0)
MCHC: 33.6 g/dL (ref 31.5–35.7)
MCV: 89 fL (ref 79–97)
Platelets: 332 10*3/uL (ref 150–450)
RBC: 4.16 x10E6/uL (ref 3.77–5.28)
RDW: 13.2 % (ref 11.7–15.4)
WBC: 4.5 10*3/uL (ref 3.4–10.8)

## 2023-03-06 LAB — CMP14+EGFR
ALT: 12 IU/L (ref 0–32)
AST: 13 IU/L (ref 0–40)
Albumin/Globulin Ratio: 1.8 (ref 1.2–2.2)
Albumin: 4.1 g/dL (ref 3.9–4.9)
Alkaline Phosphatase: 79 IU/L (ref 44–121)
BUN/Creatinine Ratio: 16 (ref 12–28)
BUN: 12 mg/dL (ref 8–27)
Bilirubin Total: 0.2 mg/dL (ref 0.0–1.2)
CO2: 25 mmol/L (ref 20–29)
Calcium: 9.1 mg/dL (ref 8.7–10.3)
Chloride: 106 mmol/L (ref 96–106)
Creatinine, Ser: 0.77 mg/dL (ref 0.57–1.00)
Globulin, Total: 2.3 g/dL (ref 1.5–4.5)
Glucose: 199 mg/dL — ABNORMAL HIGH (ref 70–99)
Potassium: 4.9 mmol/L (ref 3.5–5.2)
Sodium: 142 mmol/L (ref 134–144)
Total Protein: 6.4 g/dL (ref 6.0–8.5)
eGFR: 84 mL/min/{1.73_m2} (ref >59)

## 2023-03-06 LAB — HEMOGLOBIN A1C
Est. average glucose Bld gHb Est-mCnc: 258 mg/dL
Hgb A1c MFr Bld: 10.6 % — ABNORMAL HIGH (ref 4.8–5.6)

## 2023-03-07 DIAGNOSIS — R300 Dysuria: Secondary | ICD-10-CM | POA: Insufficient documentation

## 2023-03-07 NOTE — Progress Notes (Signed)
Subjective:  Patient ID: Mary Vazquez, female    DOB: 09-Sep-1956  Age: 67 y.o. MRN: [id number]  CC: Chief Complaint  Patient presents with   UTI follow up     Completed 2 rounds of ABX blood sugars 297 started this morning endocrinology started insulin    HPI:  67 year old female with uncontrolled DM-2 and HTN presents for evaluation of the above.  Patient has recently been treated for UTI. Has now completed 2 courses or antibiotics. Wants urine rechecked.   She is feeling lightheaded today and her sugars have been high.  Insulin just started by Endo.    Patient Active Problem List   Diagnosis Date Noted   Dysuria 03/07/2023   Lipoma 08/26/2022   Hand pain 05/19/2022   Hyperlipidemia 11/18/2021   Hypertension 03/02/2013   Diabetes ([place]) 03/02/2013   GERD (gastroesophageal reflux disease) 03/02/2013    Social Hx   Social History   Socioeconomic History   Marital status: Widowed    Spouse name: Not on file   Number of children: 3   Years of education: Not on file   Highest education level: Not on file  Occupational History   Not on file  Tobacco Use   Smoking status: Never   Smokeless tobacco: Never  Vaping Use   Vaping Use: Never used  Substance and Sexual Activity   Alcohol use: No   Drug use: No   Sexual activity: Not Currently    Birth control/protection: Post-menopausal, Surgical    Comment: tubal  Other Topics Concern   Not on file  Social History Narrative   Husband passed 01/2020.   3 children    2 grandchildren   Social Determinants of Health   Financial Resource Strain: Low Risk  (01/01/2023)   Overall Financial Resource Strain (CARDIA)    Difficulty of Paying Living Expenses: Not hard at all  Food Insecurity: No Food Insecurity (01/01/2023)   Hunger Vital Sign    Worried About Running Out of Food in the Last Year: Never true    Ran Out of Food in the Last Year: Never true  Transportation Needs: No Transportation Needs (01/01/2023)    PRAPARE - Hydrologist (Medical): No    Lack of Transportation (Non-Medical): No  Physical Activity: Sufficiently Active (01/01/2023)   Exercise Vital Sign    Days of Exercise per Week: 5 days    Minutes of Exercise per Session: 30 min  Stress: No Stress Concern Present (01/01/2023)   [place]    Feeling of Stress : Not at all  Social Connections: Moderately Isolated (12/31/2022)   Social Connection and Isolation Panel [NHANES]    Frequency of Communication with Friends and Family: More than three times a week    Frequency of Social Gatherings with Friends and Family: Once a week    Attends Religious Services: More than 4 times per year    Active Member of [employer] or Organizations: No    Attends Archivist Meetings: Never    Marital Status: Widowed    Review of Systems Per HPI  Objective:  BP (!) 146/78   Pulse 72   Temp (!) 97.4 F (36.3 C)   Ht 5\' 7"  (1.702 m)   Wt 153 lb (69.4 kg)   SpO2 100%   BMI 23.96 kg/m      03/05/2023   11:53 AM 03/05/2023   11:14 AM 02/26/2023  3:26 PM  BP/Weight  Systolic BP [id number]  Diastolic BP 78 98 76  Wt. (Lbs)  153   BMI  23.96 kg/m2     Physical Exam Vitals and nursing note reviewed.  Constitutional:      General: She is not in acute distress.    Appearance: Normal appearance.  HENT:     Head: Normocephalic and atraumatic.  Eyes:     General:        Right eye: No discharge.        Left eye: No discharge.     Conjunctiva/sclera: Conjunctivae normal.  Cardiovascular:     Rate and Rhythm: Normal rate and regular rhythm.  Pulmonary:     Effort: Pulmonary effort is normal.     Breath sounds: Normal breath sounds. No wheezing, rhonchi or rales.  Neurological:     Mental Status: She is alert.  Psychiatric:        Mood and Affect: Mood normal.        Behavior: Behavior normal.     Lab Results  Component Value Date    WBC 4.5 03/05/2023   HGB 12.5 03/05/2023   HCT 37.2 03/05/2023   PLT 332 03/05/2023   GLUCOSE 199 (H) 03/05/2023   CHOL 133 03/05/2023   TRIG 100 03/05/2023   HDL 67 03/05/2023   LDLCALC 48 03/05/2023   ALT 12 03/05/2023   AST 13 03/05/2023   NA 142 03/05/2023   K 4.9 03/05/2023   CL 106 03/05/2023   CREATININE 0.77 03/05/2023   BUN 12 03/05/2023   CO2 25 03/05/2023   HGBA1C 10.6 (H) 03/05/2023   MICROALBUR 30 mg/L 10/05/2022     Assessment & Plan:   Problem List Items Addressed This Visit       Cardiovascular and Mediastinum   Hypertension    BP mildly elevated here today. Continue enalpril. Monitor BP closely @ home.       Relevant Medications   enalapril (VASOTEC) 20 MG tablet   Other Relevant Orders   CMP14+EGFR (Completed)     Endocrine   Diabetes ([place])    Uncontrolled, A1C returned at 10.6. Close follow up with endo.       Relevant Medications   enalapril (VASOTEC) 20 MG tablet   Other Relevant Orders   Hemoglobin A1c (Completed)     Other   Hyperlipidemia   Relevant Medications   enalapril (VASOTEC) 20 MG tablet   Other Relevant Orders   Lipid panel (Completed)   Dysuria   Relevant Orders   POCT URINALYSIS DIP (CLINITEK) (Completed)   Other Visit Diagnoses     Lightheadedness       Relevant Orders   CBC (Completed)       Meds ordered this encounter  Medications   enalapril (VASOTEC) 20 MG tablet    Sig: Take 1 tablet (20 mg total) by mouth daily.    Dispense:  30 tablet    Refill:  6    Follow-up:  Return in about 1 month (around 04/05/2023) for HTN follow up.  [place]

## 2023-03-07 NOTE — Assessment & Plan Note (Signed)
Urine clear today.

## 2023-03-07 NOTE — Assessment & Plan Note (Signed)
BP mildly elevated here today. Continue enalpril. Monitor BP closely @ home.

## 2023-03-07 NOTE — Assessment & Plan Note (Signed)
Uncontrolled, A1C returned at 10.6. Close follow up with endo.

## 2023-03-09 ENCOUNTER — Encounter: Payer: Self-pay | Admitting: Nurse Practitioner

## 2023-03-10 ENCOUNTER — Other Ambulatory Visit: Payer: Self-pay | Admitting: Family Medicine

## 2023-03-10 DIAGNOSIS — E119 Type 2 diabetes mellitus without complications: Secondary | ICD-10-CM

## 2023-04-05 ENCOUNTER — Ambulatory Visit: Payer: Medicare Other | Admitting: Family Medicine

## 2023-04-05 VITALS — BP 160/81 | HR 63 | Temp 97.3°F | Ht 67.0 in | Wt 153.0 lb

## 2023-04-05 DIAGNOSIS — M25511 Pain in right shoulder: Secondary | ICD-10-CM | POA: Insufficient documentation

## 2023-04-05 DIAGNOSIS — G8929 Other chronic pain: Secondary | ICD-10-CM

## 2023-04-05 DIAGNOSIS — I10 Essential (primary) hypertension: Secondary | ICD-10-CM

## 2023-04-05 MED ORDER — MELOXICAM 15 MG PO TABS: 15.0000 mg | ORAL_TABLET | Freq: Every day | ORAL | 0 refills | Status: DC

## 2023-04-05 MED ORDER — ENALAPRIL MALEATE 20 MG PO TABS: 20.0000 mg | ORAL_TABLET | Freq: Two times a day (BID) | ORAL | 1 refills | Status: DC

## 2023-04-05 NOTE — Progress Notes (Signed)
Subjective:  Patient ID: Mary Vazquez, female    DOB: 01/28/1956  Age: 67 y.o. MRN: [id number]  CC: Chief Complaint  Patient presents with   Hypertension    Home BP readings elevated    HPI:  67 year old female presents for follow up of hypertension.  BP readings remain elevated at home (majority of the time). BP elevated here today as well. She is compliant with Enalapril 20 mg daily.   Additionally, patient reports ongoing pain of the right shoulder/upper arm.  Has seen Ortho and received injection. She has had improvement but still has some pain and decreased ROM.    Patient Active Problem List   Diagnosis Date Noted   Right shoulder pain 04/05/2023   Lipoma 08/26/2022   Hand pain 05/19/2022   Hyperlipidemia 11/18/2021   Hypertension 03/02/2013   Diabetes 03/02/2013   GERD (gastroesophageal reflux disease) 03/02/2013    Social Hx   Social History   Socioeconomic History   Marital status: Widowed    Spouse name: Not on file   Number of children: 3   Years of education: Not on file   Highest education level: Some college, no degree  Occupational History   Not on file  Tobacco Use   Smoking status: Never   Smokeless tobacco: Never  Vaping Use   Vaping Use: Never used  Substance and Sexual Activity   Alcohol use: No   Drug use: No   Sexual activity: Not Currently    Birth control/protection: Post-menopausal, Surgical    Comment: tubal  Other Topics Concern   Not on file  Social History Narrative   Husband passed 01/2020.   3 children    2 grandchildren   Social Determinants of Health   Financial Resource Strain: Low Risk  (01/01/2023)   Overall Financial Resource Strain (CARDIA)    Difficulty of Paying Living Expenses: Not hard at all  Food Insecurity: No Food Insecurity (04/04/2023)   Hunger Vital Sign    Worried About Running Out of Food in the Last Year: Never true    Ran Out of Food in the Last Year: Never true  Transportation Needs: No  Transportation Needs (04/04/2023)   PRAPARE - Administrator, Civil Service (Medical): No    Lack of Transportation (Non-Medical): No  Physical Activity: Sufficiently Active (04/04/2023)   Exercise Vital Sign    Days of Exercise per Week: 5 days    Minutes of Exercise per Session: 60 min  Stress: No Stress Concern Present (04/04/2023)   [employer] of Occupational Health - Occupational Stress Questionnaire    Feeling of Stress : Only a little  Social Connections: Moderately Isolated (04/04/2023)   Social Connection and Isolation Panel [NHANES]    Frequency of Communication with Friends and Family: Once a week    Frequency of Social Gatherings with Friends and Family: Once a week    Attends Religious Services: More than 4 times per year    Active Member of [employer] or Organizations: Yes    Attends Banker Meetings: More than 4 times per year    Marital Status: Widowed    Review of Systems Per HPI  Objective:  BP (!) 160/81   Pulse 63   Temp (!) 97.3 F (36.3 C)   Ht  (1.702 m)   Wt 153 lb (69.4 kg)   SpO2 100%   BMI 23.96 kg/m      04/05/2023    1:54 PM 04/05/2023  1:41 PM 03/05/2023   11:53 AM  BP/Weight  Systolic BP 160 162 146  Diastolic BP 81 84 78  Wt. (Lbs)  153   BMI  23.96 kg/m2     Physical Exam Vitals and nursing note reviewed.  Constitutional:      General: She is not in acute distress.    Appearance: Normal appearance.  Cardiovascular:     Rate and Rhythm: Normal rate and regular rhythm.  Pulmonary:     Effort: Pulmonary effort is normal.     Breath sounds: Normal breath sounds.  Musculoskeletal:     Comments: Shoulder: Right Inspection reveals no abnormalities, atrophy or asymmetry. Palpation is normal with no tenderness over AC joint or bicipital groove. ROM mildly decreased in full flexion. Decreased ROM in extension. Rotator cuff strength normal throughout. + Hawkins test.    Neurological:     Mental Status:  She is alert.  Psychiatric:        Mood and Affect: Mood normal.        Behavior: Behavior normal.     Lab Results  Component Value Date   WBC 4.5 03/05/2023   HGB 12.5 03/05/2023   HCT 37.2 03/05/2023   PLT 332 03/05/2023   GLUCOSE 199 (H) 03/05/2023   CHOL 133 03/05/2023   TRIG 100 03/05/2023   HDL 67 03/05/2023   LDLCALC 48 03/05/2023   ALT 12 03/05/2023   AST 13 03/05/2023   NA 142 03/05/2023   K 4.9 03/05/2023   CL 106 03/05/2023   CREATININE 0.77 03/05/2023   BUN 12 03/05/2023   CO2 25 03/05/2023   HGBA1C 10.6 (H) 03/05/2023   MICROALBUR 30 mg/L 10/05/2022     Assessment & Plan:   Problem List Items Addressed This Visit       Cardiovascular and Mediastinum   Hypertension    Uncontrolled. Increasing Enalapril to 20 mg BID. Follow up BMP ordered.       Relevant Medications   enalapril (VASOTEC) 20 MG tablet   Other Relevant Orders   Basic Metabolic Panel     Other   Right shoulder pain - Primary    Trial of Mobic.       Meds ordered this encounter  Medications   enalapril (VASOTEC) 20 MG tablet    Sig: Take 1 tablet (20 mg total) by mouth 2 (two) times daily.    Dispense:  180 tablet    Refill:  1   meloxicam (MOBIC) 15 MG tablet    Sig: Take 1 tablet (15 mg total) by mouth daily.    Dispense:  30 tablet    Refill:  0    Shar Paez DO [facility]

## 2023-04-05 NOTE — Patient Instructions (Addendum)
Enalapril twice daily.  Mobic daily for the next 2 weeks (then as needed).  Follow up in May regarding your BP.  Take care  Dr. Adriana Simas

## 2023-04-05 NOTE — Assessment & Plan Note (Signed)
Trial of Mobic 

## 2023-04-05 NOTE — Assessment & Plan Note (Signed)
Uncontrolled. Increasing Enalapril to 20 mg BID. Follow up BMP ordered.

## 2023-04-08 ENCOUNTER — Ambulatory Visit: Payer: Medicare Other | Admitting: Nurse Practitioner

## 2023-04-08 ENCOUNTER — Encounter: Payer: Self-pay | Admitting: Nurse Practitioner

## 2023-04-08 VITALS — BP 118/80 | HR 95 | Ht 67.0 in | Wt 155.8 lb

## 2023-04-08 DIAGNOSIS — I129 Hypertensive chronic kidney disease with stage 1 through stage 4 chronic kidney disease, or unspecified chronic kidney disease: Secondary | ICD-10-CM | POA: Diagnosis not present

## 2023-04-08 DIAGNOSIS — E785 Hyperlipidemia, unspecified: Secondary | ICD-10-CM

## 2023-04-08 DIAGNOSIS — Z794 Long term (current) use of insulin: Secondary | ICD-10-CM

## 2023-04-08 DIAGNOSIS — E1122 Type 2 diabetes mellitus with diabetic chronic kidney disease: Secondary | ICD-10-CM | POA: Diagnosis not present

## 2023-04-08 DIAGNOSIS — N182 Chronic kidney disease, stage 2 (mild): Secondary | ICD-10-CM | POA: Diagnosis not present

## 2023-04-08 DIAGNOSIS — Z7984 Long term (current) use of oral hypoglycemic drugs: Secondary | ICD-10-CM

## 2023-04-08 NOTE — Progress Notes (Signed)
Endocrinology Follow Up Note       04/08/2023, 11:31 AM   Subjective:    Patient ID: Mary Vazquez, female    DOB: 01-Aug-1956.  Mary Vazquez is being seen in follow up after being seen in consultation for management of currently uncontrolled symptomatic diabetes requested by  Tommie Sams, DO.   Past Medical History:  Diagnosis Date   Diabetes    Hypertension    PONV (postoperative nausea and vomiting)     Past Surgical History:  Procedure Laterality Date   COLONOSCOPY   08/08/2007   SLF:  A 5-mm sessile descending colon polyp removed via cold forceps.Normal retroflexed view of the rectum/ Small internal hemorrhoids, PATH: tubular adenoma   COLONOSCOPY  01/09/2013   Procedure: COLONOSCOPY;  Surgeon: West Bali, MD;  [facility]: [facility];  Service: Endoscopy;  Laterality: N/A;  8:30   COLONOSCOPY WITH PROPOFOL N/A 01/12/2023   Procedure: COLONOSCOPY WITH PROPOFOL;  Surgeon: Lanelle Bal, DO;  Location: [facility];  Service: Endoscopy;  Laterality: N/A;  10:30am., asa 2   POLYPECTOMY  01/12/2023   Procedure: POLYPECTOMY;  Surgeon: Lanelle Bal, DO;  Location: [facility];  Service: Endoscopy;;   TUBAL LIGATION      Social History   Socioeconomic History   Marital status: Widowed    Spouse name: Not on file   Number of children: 3   Years of education: Not on file   Highest education level: Some college, no degree  Occupational History   Not on file  Tobacco Use   Smoking status: Never   Smokeless tobacco: Never  Vaping Use   Vaping Use: Never used  Substance and Sexual Activity   Alcohol use: No   Drug use: No   Sexual activity: Not Currently    Birth control/protection: Post-menopausal, Surgical    Comment: tubal  Other Topics Concern   Not on file  Social History Narrative   Husband passed 01/2020.   3 children    2 grandchildren   Social Determinants of  Health   Financial Resource Strain: Low Risk  (01/01/2023)   Overall Financial Resource Strain (CARDIA)    Difficulty of Paying Living Expenses: Not hard at all  Food Insecurity: No Food Insecurity (04/04/2023)   Hunger Vital Sign    Worried About Running Out of Food in the Last Year: Never true    Ran Out of Food in the Last Year: Never true  Transportation Needs: No Transportation Needs (04/04/2023)   PRAPARE - Administrator, Civil Service (Medical): No    Lack of Transportation (Non-Medical): No  Physical Activity: Sufficiently Active (04/04/2023)   Exercise Vital Sign    Days of Exercise per Week: 5 days    Minutes of Exercise per Session: 60 min  Stress: No Stress Concern Present (04/04/2023)   [employer] of Occupational Health - Occupational Stress Questionnaire    Feeling of Stress : Only a little  Social Connections: Moderately Isolated (04/04/2023)   Social Connection and Isolation Panel [NHANES]    Frequency of Communication with Friends and Family: Once a week    Frequency of Social Gatherings with  Friends and Family: Once a week    Attends Religious Services: More than 4 times per year    Active Member of Clubs or Organizations: Yes    Attends Banker Meetings: More than 4 times per year    Marital Status: Widowed    Family History  Problem Relation Age of Onset   Cancer Father        prostate   Diabetes Sister    Diabetes Maternal Grandmother    Diabetes Paternal Aunt    Colon cancer Neg Hx     Outpatient Encounter Medications as of 04/08/2023  Medication Sig   acetaminophen (TYLENOL) 650 MG CR tablet Take 1,300 mg by mouth every 8 (eight) hours as needed for pain.   atorvastatin (LIPITOR) 20 MG tablet Take 1 tablet (20 mg total) by mouth daily. (Patient taking differently: Take 20 mg by mouth every evening.)   cetirizine (ZYRTEC) 10 MG tablet Take 10 mg by mouth daily as needed for allergies.   enalapril (VASOTEC) 20 MG tablet Take 1  tablet (20 mg total) by mouth 2 (two) times daily.   glipiZIDE (GLUCOTROL) 5 MG tablet Take 1 tablet (5 mg total) by mouth daily before breakfast.   insulin glargine (LANTUS SOLOSTAR) 100 UNIT/ML Solostar Pen Inject 15 Units into the skin at bedtime.   Insulin Pen Needle (PEN NEEDLES) 31G X 6 MM MISC Use to inject insulin once daily   MAGNESIUM PO Take 260 mg by mouth daily.   meloxicam (MOBIC) 15 MG tablet Take 1 tablet (15 mg total) by mouth daily.   metFORMIN (GLUCOPHAGE) 500 MG tablet Take 2 tablets (1,000 mg total) by mouth 2 (two) times daily with a meal.   OVER THE COUNTER MEDICATION Garlic and fish oil   OVER THE COUNTER MEDICATION Calcium 600 mg viatmin d   Polyethyl Glycol-Propyl Glycol (SYSTANE OP) Apply to eye.   Propylene Glycol (SYSTANE BALANCE) 0.6 % SOLN Place 1 drop into both eyes daily as needed (dry eyes).   Cholecalciferol (VITAMIN D3) LIQD Take 2,000 Units by mouth daily. (Patient not taking: Reported on 04/08/2023)   Probiotic Product (PROBIOTIC PO) Take 1 capsule by mouth daily. (Patient not taking: Reported on 04/08/2023)   No facility-administered encounter medications on file as of 04/08/2023.    ALLERGIES: Allergies  Allergen Reactions   Ozempic (0.25 Or 0.5 Mg-Dose) [Semaglutide(0.25 Or 0.5mg -Dos)] Nausea And Vomiting   Septra [Sulfamethoxazole-Trimethoprim] Swelling and Rash    VACCINATION STATUS: Immunization History  Administered Date(s) Administered   Fluad Quad(high Dose 65+) 10/09/2020, 10/07/2021, 08/25/2022   Influenza, High Dose Seasonal PF 10/23/2019   Influenza,inj,Quad PF,6+ Mos 08/23/2014, 11/18/2015, 11/01/2017, 10/18/2018   Influenza-Unspecified 10/01/2016, 10/18/2018   Moderna SARS-COV2 Booster Vaccination 10/30/2020, 06/10/2021   Moderna Sars-Covid-2 Vaccination 03/24/2020, 04/22/2020   PNEUMOCOCCAL CONJUGATE-20 11/24/2022   Td 06/29/2016    Diabetes She presents for her follow-up diabetic visit. She has type 2 diabetes mellitus. Onset  time: Diagnosed at approx age of 67 (also had Gest DM) Her disease course has been improving. There are no hypoglycemic associated symptoms. There are no diabetic associated symptoms. There are no hypoglycemic complications. There are no diabetic complications. Risk factors for coronary artery disease include diabetes mellitus and family history. Current diabetic treatment includes oral agent (dual therapy) and insulin injections. She is compliant with treatment most of the time. Her weight is fluctuating minimally. She is following a generally healthy diet. She has had a previous visit with a dietitian. She participates in exercise  daily. Her home blood glucose trend is decreasing steadily. Her breakfast blood glucose range is generally 90-110 mg/dl. Her bedtime blood glucose range is generally 140-180 mg/dl. (She presents today with her logs showing significantly improved glycemic profile with at target fasting and postprandial readings.  She was not due for another A1c today.  She has tolerated the addition of basal insulin well after UTI triggered high spikes in glucose. ) An ACE inhibitor/angiotensin II receptor blocker is being taken. She does not see a podiatrist.Eye exam is current.    Review of systems  Constitutional: + Minimally fluctuating body weight,  current Body mass index is 24.4 kg/m. , no fatigue, no subjective hyperthermia, no subjective hypothermia Eyes: no blurry vision, no xerophthalmia ENT: no sore throat, no nodules palpated in throat, no dysphagia/odynophagia, no hoarseness Cardiovascular: no chest pain, no shortness of breath, no palpitations, no leg swelling Respiratory: no cough, no shortness of breath Gastrointestinal: no nausea/vomiting/diarrhea Musculoskeletal: no muscle/joint aches Skin: no rashes, no hyperemia Neurological: no tremors, no numbness, no tingling, no dizziness Psychiatric: no depression, no anxiety  Objective:     BP 118/80 (BP Location: Left Arm,  Patient Position: Sitting, Cuff Size: Normal)   Pulse 95   Ht 5\' 7"  (1.702 m)   Wt 155 lb 12.8 oz (70.7 kg)   BMI 24.40 kg/m   Wt Readings from Last 3 Encounters:  04/08/23 155 lb 12.8 oz (70.7 kg)  04/05/23 153 lb (69.4 kg)  03/05/23 153 lb (69.4 kg)     BP Readings from Last 3 Encounters:  04/08/23 118/80  04/05/23 (!) 160/81  03/05/23 (!) 146/78     Physical Exam- Limited  Constitutional:  Body mass index is 24.4 kg/m. , not in acute distress, normal state of mind Eyes:  EOMI, no exophthalmos Musculoskeletal: no gross deformities, strength intact in all four extremities, no gross restriction of joint movements Skin:  no rashes, no hyperemia Neurological: no tremor with outstretched hands   Diabetic Foot Exam - Simple   No data filed     CMP ( most recent) CMP     Component Value Date/Time   NA 142 03/05/2023 1221   K 4.9 03/05/2023 1221   CL 106 03/05/2023 1221   CO2 25 03/05/2023 1221   GLUCOSE 199 (H) 03/05/2023 1221   GLUCOSE 128 (H) 01/08/2014 1209   BUN 12 03/05/2023 1221   CREATININE 0.77 03/05/2023 1221   CREATININE 0.91 01/08/2014 1209   CALCIUM 9.1 03/05/2023 1221   PROT 6.4 03/05/2023 1221   ALBUMIN 4.1 03/05/2023 1221   AST 13 03/05/2023 1221   ALT 12 03/05/2023 1221   ALKPHOS 79 03/05/2023 1221   BILITOT 0.2 03/05/2023 1221   GFRNONAA 69 10/18/2020 0821   GFRAA 79 10/18/2020 0821     Diabetic Labs (most recent): Lab Results  Component Value Date   HGBA1C 10.6 (H) 03/05/2023   HGBA1C 8.0 (H) 11/24/2022   HGBA1C 7.8 (H) 08/25/2022   MICROALBUR 30 mg/L 10/05/2022   MICROALBUR 1.22 01/08/2014     Lipid Panel ( most recent) Lipid Panel     Component Value Date/Time   CHOL 133 03/05/2023 1221   TRIG 100 03/05/2023 1221   HDL 67 03/05/2023 1221   CHOLHDL 2.0 03/05/2023 1221   CHOLHDL 2.8 01/08/2014 1209   VLDL 17 01/08/2014 1209   LDLCALC 48 03/05/2023 1221   LABVLDL 18 03/05/2023 1221      No results found for: "TSH",  "FREET4"  Assessment & Plan:   1) Type 2 diabetes mellitus with stage 2 chronic kidney disease, without long-term current use of insulin (HCC)  She presents today with her logs showing significantly improved glycemic profile with at target fasting and postprandial readings.  She was not due for another A1c today.  She has tolerated the addition of basal insulin well after UTI triggered high spikes in glucose.   - CLEVELAND PAIZ has currently uncontrolled symptomatic type 2 DM since 67 years of age.   -Recent labs reviewed.  - I had a long discussion with her about the progressive nature of diabetes and the pathology behind its complications. -her diabetes is not currently complicated but she remains at a high risk for more acute and chronic complications which include CAD, CVA, CKD, retinopathy, and neuropathy. These are all discussed in detail with her.  The following Lifestyle Medicine recommendations according to American College of Lifestyle Medicine [facility]) were discussed and offered to patient and she agrees to start the journey:  A. Whole Foods, Plant-based plate comprising of fruits and vegetables, plant-based proteins, whole-grain carbohydrates was discussed in detail with the patient.   A list for source of those nutrients were also provided to the patient.  Patient will use only water or unsweetened tea for hydration. B.  The need to stay away from risky substances including alcohol, smoking; obtaining 7 to 9 hours of restorative sleep, at least 150 minutes of moderate intensity exercise weekly, the importance of healthy social connections,  and stress reduction techniques were discussed. C.  A full color page of  Calorie density of various food groups per pound showing examples of each food groups was provided to the patient.  - Nutritional counseling repeated at each appointment due to patients tendency to fall back in to old habits.  - The patient admits there is a  room for improvement in their diet and drink choices. -  Suggestion is made for the patient to avoid simple carbohydrates from their diet including Cakes, Sweet Desserts / Pastries, Ice Cream, Soda (diet and regular), Sweet Tea, Candies, Chips, Cookies, Sweet Pastries, Store Bought Juices, Alcohol in Excess of 1-2 drinks a day, Artificial Sweeteners, Coffee Creamer, and "Sugar-free" Products. This will help patient to have stable blood glucose profile and potentially avoid unintended weight gain.   - I encouraged the patient to switch to unprocessed or minimally processed complex starch and increased protein intake (animal or plant source), fruits, and vegetables.   - Patient is advised to stick to a routine mealtimes to eat 3 meals a day and avoid unnecessary snacks (to snack only to correct hypoglycemia).  - she has already been set up with Norm Salt, RDN, CDE for diabetes education.  - I have approached her with the following individualized plan to manage her diabetes and patient agrees:   -She is advised to continue Metformin 1000 mg po twice daily with meals and lower her Glipizide to 5 mg po daily with breakfast (skipping her supper dose).  She can continue Lantus 15 units SQ nightly for now.  If she experiences any hypoglycemia, she is instructed to stop the Glipizide and reach out to me.  -she is encouraged to continue monitoring glucose twice daily, before breakfast and before bed, and to call the clinic if she has readings less than 70 or above 300 for 3 tests in a row.  - Adjustment parameters are given to her for hypo and hyperglycemia in writing.  - her [place] Pepper will  be discontinued, risk outweighs benefit for this patient.  She notes significant vaginal yeast with this medication, requiring prescriptions to alleviate.  - she is not an ideal candidate for incretin therapy due to body habitus and BMI less than 25.  She also notes allergy to Ozempic in the past.  - Specific  targets for  A1c; LDL, HDL, and Triglycerides were discussed with the patient.  2) Blood Pressure /Hypertension:  her blood pressure is at target.   she is advised to continue her current medications including Enalapril 20 mg p.o. daily with breakfast.  3) Lipids/Hyperlipidemia:    Review of her recent lipid panel from 03/05/23 showed controlled LDL at 48 .  she is advised to continue Lipitor 20 mg daily at bedtime.  Side effects and precautions discussed with her.  4)  Weight/Diet:  her Body mass index is 24.4 kg/m.  -   she is NOT a candidate for weight loss.  Exercise, and detailed carbohydrates information provided  -  detailed on discharge instructions.  5) Chronic Care/Health Maintenance: -she is on ACEI/ARB and Statin medications and is encouraged to initiate and continue to follow up with Ophthalmology, Dentist, Podiatrist at least yearly or according to recommendations, and advised to stay away from smoking. I have recommended yearly flu vaccine and pneumonia vaccine at least every 5 years; moderate intensity exercise for up to 150 minutes weekly; and sleep for at least 7 hours a day.  - she is advised to maintain close follow up with Tommie Sams, DO for primary care needs, as well as her other providers for optimal and coordinated care.     I spent  30  minutes in the care of the patient today including review of labs from CMP, Lipids, Thyroid Function, Hematology (current and previous including abstractions from other facilities); face-to-face time discussing  her blood glucose readings/logs, discussing hypoglycemia and hyperglycemia episodes and symptoms, medications doses, her options of short and long term treatment based on the latest standards of care / guidelines;  discussion about incorporating lifestyle medicine;  and documenting the encounter. Risk reduction counseling performed per USPSTF guidelines to reduce obesity and cardiovascular risk factors.     Please refer to  Patient Instructions for Blood Glucose Monitoring and Insulin/Medications Dosing Guide"  in media tab for additional information. Please  also refer to " Patient Self Inventory" in the Media  tab for reviewed elements of pertinent patient history.  [name] participated in the discussions, expressed understanding, and voiced agreement with the above plans.  All questions were answered to her satisfaction. she is encouraged to contact clinic should she have any questions or concerns prior to her return visit.     Follow up plan: - Return in about 3 months (around 07/08/2023) for Diabetes F/U with A1c in office, No previsit labs, Bring meter and logs.   Ronny Bacon, [facility] [address] [place], [place] [address] Phone: [phone] Fax: [phone]  04/08/2023, 11:31 AM

## 2023-04-28 ENCOUNTER — Ambulatory Visit: Payer: Medicare Other | Admitting: Family Medicine

## 2023-04-28 VITALS — BP 114/67 | HR 74 | Temp 97.5°F | Ht 67.0 in | Wt 155.0 lb

## 2023-04-28 DIAGNOSIS — I10 Essential (primary) hypertension: Secondary | ICD-10-CM | POA: Diagnosis not present

## 2023-04-28 DIAGNOSIS — E119 Type 2 diabetes mellitus without complications: Secondary | ICD-10-CM

## 2023-04-28 DIAGNOSIS — E782 Mixed hyperlipidemia: Secondary | ICD-10-CM

## 2023-04-28 DIAGNOSIS — Z7984 Long term (current) use of oral hypoglycemic drugs: Secondary | ICD-10-CM | POA: Diagnosis not present

## 2023-04-28 NOTE — Patient Instructions (Signed)
You're doing well.  Continue your medications.  Follow up in 6 months. 

## 2023-04-30 MED ORDER — CETIRIZINE HCL 10 MG PO TABS: 10.0000 mg | ORAL_TABLET | Freq: Every day | ORAL | 3 refills | Status: DC | PRN

## 2023-04-30 NOTE — Assessment & Plan Note (Signed)
Improving.  Continue current pharmacotherapy.

## 2023-04-30 NOTE — Assessment & Plan Note (Signed)
Well controlled. Continue enalapril.

## 2023-04-30 NOTE — Assessment & Plan Note (Signed)
At goal. Continue Lipitor. 

## 2023-04-30 NOTE — Progress Notes (Signed)
Subjective:  Patient ID: Mary Vazquez, female    DOB: [birth year]  Age: 67 y.o. MRN: [id number]  CC: Chief Complaint  Patient presents with   Hypertension    Home readings - today    Diabetes    HPI:  67 year old female with type 2 diabetes, hypertension, GERD, hyperlipidemia presents for follow-up.  Patient's hypertension is well-controlled on enalapril.  Patient's blood sugar readings are improving significantly.  She states that her fasting blood sugars are ranging from the 90s to 130s.  Follows with Endo.  Is on metformin, glipizide, and Lantus 15 units.  No hypoglycemia.  Lipids well-controlled on Lipitor.  Patient Active Problem List   Diagnosis Date Noted   Right shoulder pain 04/05/2023   Lipoma 08/26/2022   Hand pain 05/19/2022   Hyperlipidemia 11/18/2021   Hypertension 03/02/2013   Diabetes (HCC) 03/02/2013   GERD (gastroesophageal reflux disease) 03/02/2013    Social Hx   Social History   Socioeconomic History   Marital status: Widowed    Spouse name: Not on file   Number of children: 3   Years of education: Not on file   Highest education level: Some college, no degree  Occupational History   Not on file  Tobacco Use   Smoking status: Never   Smokeless tobacco: Never  Vaping Use   Vaping Use: Never used  Substance and Sexual Activity   Alcohol use: No   Drug use: No   Sexual activity: Not Currently    Birth control/protection: Post-menopausal, Surgical    Comment: tubal  Other Topics Concern   Not on file  Social History Narrative   Husband passed 01/2020.   3 children    2 grandchildren   Social Determinants of Health   Financial Resource Strain: Low Risk  (01/01/2023)   Overall Financial Resource Strain (CARDIA)    Difficulty of Paying Living Expenses: Not hard at all  Food Insecurity: No Food Insecurity (04/04/2023)   Hunger Vital Sign    Worried About Running Out of Food in the Last Year: Never true    Ran Out of Food in the  Last Year: Never true  Transportation Needs: No Transportation Needs (04/04/2023)   PRAPARE - Administrator, Civil Service (Medical): No    Lack of Transportation (Non-Medical): No  Physical Activity: Sufficiently Active (04/04/2023)   Exercise Vital Sign    Days of Exercise per Week: 5 days    Minutes of Exercise per Session: 60 min  Stress: No Stress Concern Present (04/04/2023)   [employer] of Occupational Health - Occupational Stress Questionnaire    Feeling of Stress : Only a little  Social Connections: Moderately Isolated (04/04/2023)   Social Connection and Isolation Panel [NHANES]    Frequency of Communication with Friends and Family: Once a week    Frequency of Social Gatherings with Friends and Family: Once a week    Attends Religious Services: More than 4 times per year    Active Member of [employer] or Organizations: Yes    Attends Banker Meetings: More than 4 times per year    Marital Status: Widowed    Review of Systems  Respiratory: Negative.    Cardiovascular: Negative.    Objective:  BP 114/67   Pulse 74   Temp (!) 97.5 F (36.4 C)   Ht 5\' 7"  (1.702 m)   Wt 155 lb (70.3 kg)   SpO2 99%   BMI 24.28 kg/m  04/28/2023   10:55 AM 04/08/2023   10:39 AM 04/05/2023    1:54 PM  BP/Weight  Systolic BP 114 118 160  Diastolic BP 67 80 81  Wt. (Lbs) 155 155.8   BMI 24.28 kg/m2 24.4 kg/m2     Physical Exam Vitals and nursing note reviewed.  Constitutional:      General: She is not in acute distress.    Appearance: Normal appearance.  HENT:     Head: Normocephalic and atraumatic.  Eyes:     General:        Right eye: No discharge.        Left eye: No discharge.     Conjunctiva/sclera: Conjunctivae normal.  Cardiovascular:     Rate and Rhythm: Normal rate and regular rhythm.  Pulmonary:     Effort: Pulmonary effort is normal.     Breath sounds: Normal breath sounds. No wheezing, rhonchi or rales.  Neurological:     Mental  Status: She is alert.  Psychiatric:        Mood and Affect: Mood normal.        Behavior: Behavior normal.     Lab Results  Component Value Date   WBC 4.5 03/05/2023   HGB 12.5 03/05/2023   HCT 37.2 03/05/2023   PLT 332 03/05/2023   GLUCOSE 199 (H) 03/05/2023   CHOL 133 03/05/2023   TRIG 100 03/05/2023   HDL 67 03/05/2023   LDLCALC 48 03/05/2023   ALT 12 03/05/2023   AST 13 03/05/2023   NA 142 03/05/2023   K 4.9 03/05/2023   CL 106 03/05/2023   CREATININE 0.77 03/05/2023   BUN 12 03/05/2023   CO2 25 03/05/2023   HGBA1C 10.6 (H) 03/05/2023   MICROALBUR 30 mg/L 10/05/2022     Assessment & Plan:   Problem List Items Addressed This Visit       Cardiovascular and Mediastinum   Hypertension - Primary    Well-controlled.  Continue enalapril.        Endocrine   Diabetes (HCC)    Improving.  Continue current pharmacotherapy.        Other   Hyperlipidemia    At goal.  Continue Lipitor.       Meds ordered this encounter  Medications   cetirizine (ZYRTEC) 10 MG tablet    Sig: Take 1 tablet (10 mg total) by mouth daily as needed for allergies.    Dispense:  90 tablet    Refill:  3    Follow-up:  Return in about 6 months (around 10/29/2023).  Everlene Other DO [facility]

## 2023-05-24 ENCOUNTER — Other Ambulatory Visit: Payer: Self-pay | Admitting: Family Medicine

## 2023-06-22 ENCOUNTER — Other Ambulatory Visit: Payer: Self-pay | Admitting: Family Medicine

## 2023-06-30 ENCOUNTER — Other Ambulatory Visit: Payer: Self-pay | Admitting: Nurse Practitioner

## 2023-07-13 ENCOUNTER — Ambulatory Visit: Payer: Medicare Other | Admitting: Nurse Practitioner

## 2023-07-13 ENCOUNTER — Encounter: Payer: Self-pay | Admitting: Nurse Practitioner

## 2023-07-13 VITALS — BP 136/82 | HR 76 | Ht 66.5 in | Wt 159.0 lb

## 2023-07-13 DIAGNOSIS — E1122 Type 2 diabetes mellitus with diabetic chronic kidney disease: Secondary | ICD-10-CM | POA: Diagnosis not present

## 2023-07-13 DIAGNOSIS — Z794 Long term (current) use of insulin: Secondary | ICD-10-CM

## 2023-07-13 DIAGNOSIS — N182 Chronic kidney disease, stage 2 (mild): Secondary | ICD-10-CM | POA: Diagnosis not present

## 2023-07-13 DIAGNOSIS — Z7984 Long term (current) use of oral hypoglycemic drugs: Secondary | ICD-10-CM | POA: Diagnosis not present

## 2023-07-13 LAB — POCT GLYCOSYLATED HEMOGLOBIN (HGB A1C): Hemoglobin A1C: 7.8 % — AB (ref 4.0–5.6)

## 2023-07-13 MED ORDER — LANTUS SOLOSTAR 100 UNIT/ML ~~LOC~~ SOPN: 15.0000 [IU] | PEN_INJECTOR | Freq: Every day | SUBCUTANEOUS | 3 refills | Status: DC

## 2023-07-13 NOTE — Progress Notes (Signed)
Endocrinology Follow Up Note       07/13/2023, 11:52 AM   Subjective:    Patient ID: Mary Vazquez, female    DOB: 05-24-1956.  Mary Vazquez is being seen in follow up after being seen in consultation for management of currently uncontrolled symptomatic diabetes requested by  Tommie Sams, DO.   Past Medical History:  Diagnosis Date  . Diabetes (HCC)   . Hypertension   . PONV (postoperative nausea and vomiting)     Past Surgical History:  Procedure Laterality Date  . COLONOSCOPY   08/08/2007   SLF:  A 5-mm sessile descending colon polyp removed via cold forceps.Normal retroflexed view of the rectum/ Small internal hemorrhoids, PATH: tubular adenoma  . COLONOSCOPY  01/09/2013   Procedure: COLONOSCOPY;  Surgeon: West Bali, MD;  Location: [facility];  Service: Endoscopy;  Laterality: N/A;  8:30  . COLONOSCOPY WITH PROPOFOL N/A 01/12/2023   Procedure: COLONOSCOPY WITH PROPOFOL;  Surgeon: Lanelle Bal, DO;  Location: [facility];  Service: Endoscopy;  Laterality: N/A;  10:30am., asa 2  . POLYPECTOMY  01/12/2023   Procedure: POLYPECTOMY;  Surgeon: Lanelle Bal, DO;  Location: [facility];  Service: Endoscopy;;  . TUBAL LIGATION      Social History   Socioeconomic History  . Marital status: Widowed    Spouse name: Not on file  . Number of children: 3  . Years of education: Not on file  . Highest education level: Some college, no degree  Occupational History  . Not on file  Tobacco Use  . Smoking status: Never  . Smokeless tobacco: Never  Vaping Use  . Vaping status: Never Used  Substance and Sexual Activity  . Alcohol use: No  . Drug use: No  . Sexual activity: Not Currently    Birth control/protection: Post-menopausal, Surgical    Comment: tubal  Other Topics Concern  . Not on file  Social History Narrative   Husband passed 01/2020.   3 children    2 grandchildren    Social Determinants of Health   Financial Resource Strain: Low Risk  (01/01/2023)   Overall Financial Resource Strain (CARDIA)   . Difficulty of Paying Living Expenses: Not hard at all  Food Insecurity: No Food Insecurity (04/04/2023)   Hunger Vital Sign   . Worried About Programme researcher, broadcasting/film/video in the Last Year: Never true   . Ran Out of Food in the Last Year: Never true  Transportation Needs: No Transportation Needs (04/04/2023)   PRAPARE - Transportation   . Lack of Transportation (Medical): No   . Lack of Transportation (Non-Medical): No  Physical Activity: Sufficiently Active (04/04/2023)   Exercise Vital Sign   . Days of Exercise per Week: 5 days   . Minutes of Exercise per Session: 60 min  Stress: No Stress Concern Present (04/04/2023)   [employer] of Occupational Health - Occupational Stress Questionnaire   . Feeling of Stress : Only a little  Social Connections: Moderately Isolated (04/04/2023)   Social Connection and Isolation Panel [NHANES]   . Frequency of Communication with Friends and Family: Once a week   . Frequency of Social Gatherings  with Friends and Family: Once a week   . Attends Religious Services: More than 4 times per year   . Active Member of Clubs or Organizations: Yes   . Attends Banker Meetings: More than 4 times per year   . Marital Status: Widowed    Family History  Problem Relation Age of Onset  . Cancer Father        prostate  . Diabetes Sister   . Diabetes Maternal Grandmother   . Diabetes Paternal Aunt   . Colon cancer Neg Hx     Outpatient Encounter Medications as of 07/13/2023  Medication Sig  . acetaminophen (TYLENOL) 650 MG CR tablet Take 1,300 mg by mouth every 8 (eight) hours as needed for pain.  . cetirizine (ZYRTEC) 10 MG tablet Take 1 tablet (10 mg total) by mouth daily as needed for allergies.  . Cholecalciferol (VITAMIN D3 PO) Take 2,000 Units by mouth daily. Patient reports that this is ain liquid form  .  enalapril (VASOTEC) 20 MG tablet Take 1 tablet (20 mg total) by mouth 2 (two) times daily.  . Insulin Pen Needle (PEN NEEDLES) 31G X 6 MM MISC Use to inject insulin once daily  . meloxicam (MOBIC) 15 MG tablet Take 1 tablet (15 mg total) by mouth daily as needed for pain.  . metFORMIN (GLUCOPHAGE) 500 MG tablet Take 2 tablets (1,000 mg total) by mouth 2 (two) times daily with a meal.  . Polyethyl Glycol-Propyl Glycol (SYSTANE OP) Apply to eye.  Marland Kitchen Propylene Glycol (SYSTANE BALANCE) 0.6 % SOLN Place 1 drop into both eyes daily as needed (dry eyes).  . [DISCONTINUED] insulin glargine (LANTUS SOLOSTAR) 100 UNIT/ML Solostar Pen Inject 15 Units into the skin at bedtime.  . insulin glargine (LANTUS SOLOSTAR) 100 UNIT/ML Solostar Pen Inject 15 Units into the skin at bedtime.  Marland Kitchen MAGNESIUM PO Take 260 mg by mouth daily. (Patient not taking: Reported on 07/13/2023)  . OVER THE COUNTER MEDICATION Garlic and fish oil (Patient not taking: Reported on 07/13/2023)  . OVER THE COUNTER MEDICATION Calcium 600 mg viatmin d (Patient not taking: Reported on 07/13/2023)  . [DISCONTINUED] atorvastatin (LIPITOR) 20 MG tablet Take 1 tablet (20 mg total) by mouth daily. (Patient not taking: Reported on 07/13/2023)  . [DISCONTINUED] glipiZIDE (GLUCOTROL) 5 MG tablet TAKE 1 TABLET BY MOUTH DAILY BEFORE BREAKFAST. (Patient not taking: Reported on 07/13/2023)   No facility-administered encounter medications on file as of 07/13/2023.    ALLERGIES: Allergies  Allergen Reactions  . Ozempic (0.25 Or 0.5 Mg-Dose) [Semaglutide(0.25 Or 0.5mg -Dos)] Nausea And Vomiting  . Septra [Sulfamethoxazole-Trimethoprim] Swelling and Rash    VACCINATION STATUS: Immunization History  Administered Date(s) Administered  . Fluad Quad(high Dose 65+) 10/09/2020, 10/07/2021, 08/25/2022  . Influenza, High Dose Seasonal PF 10/23/2019  . Influenza,inj,Quad PF,6+ Mos 08/23/2014, 11/18/2015, 11/01/2017, 10/18/2018  . Influenza-Unspecified 10/01/2016,  10/18/2018  . Moderna SARS-COV2 Booster Vaccination 10/30/2020, 06/10/2021  . Moderna Sars-Covid-2 Vaccination 03/24/2020, 04/22/2020  . PNEUMOCOCCAL CONJUGATE-20 11/24/2022  . Td 06/29/2016    Diabetes She presents for her follow-up diabetic visit. She has type 2 diabetes mellitus. Onset time: Diagnosed at approx age of 15 (also had Gest DM) Her disease course has been improving. There are no hypoglycemic associated symptoms. There are no diabetic associated symptoms. There are no hypoglycemic complications. There are no diabetic complications. Risk factors for coronary artery disease include diabetes mellitus and family history. Current diabetic treatment includes oral agent (dual therapy) and insulin injections. She is compliant with  treatment most of the time. Her weight is fluctuating minimally. She is following a generally healthy diet. She has had a previous visit with a dietitian. She participates in exercise daily. Her home blood glucose trend is decreasing steadily. Her breakfast blood glucose range is generally 90-110 mg/dl. Her bedtime blood glucose range is generally 140-180 mg/dl. (She presents today with her logs showing significantly improved glycemic profile with at target fasting and postprandial readings. Her POCT A1c today is 7.8%, improving drastically from last visit of 10.6%.  She stopped her Glipizide on 7/20 after significant hypoglycemia during her physical activity.) An ACE inhibitor/angiotensin II receptor blocker is being taken. She does not see a podiatrist.Eye exam is current.   Review of systems  Constitutional: + Minimally fluctuating body weight,  current Body mass index is 25.28 kg/m. , no fatigue, no subjective hyperthermia, no subjective hypothermia Eyes: no blurry vision, no xerophthalmia ENT: no sore throat, no nodules palpated in throat, no dysphagia/odynophagia, no hoarseness Cardiovascular: no chest pain, no shortness of breath, no palpitations, no leg  swelling Respiratory: no cough, no shortness of breath Gastrointestinal: no nausea/vomiting/diarrhea Musculoskeletal: no muscle/joint aches Skin: no rashes, no hyperemia Neurological: no tremors, no numbness, no tingling, no dizziness Psychiatric: no depression, no anxiety  Objective:     BP 136/82 (BP Location: Left Arm, Patient Position: Sitting, Cuff Size: Large) Comment: Retake with manuel cuff  Pulse 76   Ht 5' 6.5" (1.689 m)   Wt 159 lb (72.1 kg)   BMI 25.28 kg/m   Wt Readings from Last 3 Encounters:  07/13/23 159 lb (72.1 kg)  04/28/23 155 lb (70.3 kg)  04/08/23 155 lb 12.8 oz (70.7 kg)     BP Readings from Last 3 Encounters:  07/13/23 136/82  04/28/23 114/67  04/08/23 118/80      Physical Exam- Limited  Constitutional:  Body mass index is 25.28 kg/m. , not in acute distress, normal state of mind Eyes:  EOMI, no exophthalmos Musculoskeletal: no gross deformities, strength intact in all four extremities, no gross restriction of joint movements Skin:  no rashes, no hyperemia Neurological: no tremor with outstretched hands   Diabetic Foot Exam - Simple   No data filed     CMP ( most recent) CMP     Component Value Date/Time   NA 142 03/05/2023 1221   K 4.9 03/05/2023 1221   CL 106 03/05/2023 1221   CO2 25 03/05/2023 1221   GLUCOSE 199 (H) 03/05/2023 1221   GLUCOSE 128 (H) 01/08/2014 1209   BUN 12 03/05/2023 1221   CREATININE 0.77 03/05/2023 1221   CREATININE 0.91 01/08/2014 1209   CALCIUM 9.1 03/05/2023 1221   PROT 6.4 03/05/2023 1221   ALBUMIN 4.1 03/05/2023 1221   AST 13 03/05/2023 1221   ALT 12 03/05/2023 1221   ALKPHOS 79 03/05/2023 1221   BILITOT 0.2 03/05/2023 1221   GFRNONAA 69 10/18/2020 0821   GFRAA 79 10/18/2020 0821     Diabetic Labs (most recent): Lab Results  Component Value Date   HGBA1C 7.8 (A) 07/13/2023   HGBA1C 10.6 (H) 03/05/2023   HGBA1C 8.0 (H) 11/24/2022   MICROALBUR 30 mg/L 10/05/2022   MICROALBUR 1.22 01/08/2014      Lipid Panel ( most recent) Lipid Panel     Component Value Date/Time   CHOL 133 03/05/2023 1221   TRIG 100 03/05/2023 1221   HDL 67 03/05/2023 1221   CHOLHDL 2.0 03/05/2023 1221   CHOLHDL 2.8 01/08/2014 1209   VLDL 17  01/08/2014 1209   LDLCALC 48 03/05/2023 1221   LABVLDL 18 03/05/2023 1221      No results found for: "TSH", "FREET4"         Assessment & Plan:   1) Type 2 diabetes mellitus with stage 2 chronic kidney disease, without long-term current use of insulin (HCC)  She presents today with her logs showing significantly improved glycemic profile with at target fasting and postprandial readings. Her POCT A1c today is 7.8%, improving drastically from last visit of 10.6%.  She stopped her Glipizide on 7/20 after significant hypoglycemia during her physical activity.  - Mary Vazquez has currently uncontrolled symptomatic type 2 DM since 67 years of age.   -Recent labs reviewed.  - I had a long discussion with her about the progressive nature of diabetes and the pathology behind its complications. -her diabetes is not currently complicated but she remains at a high risk for more acute and chronic complications which include CAD, CVA, CKD, retinopathy, and neuropathy. These are all discussed in detail with her.  The following Lifestyle Medicine recommendations according to American College of Lifestyle Medicine [facility]) were discussed and offered to patient and she agrees to start the journey:  A. Whole Foods, Plant-based plate comprising of fruits and vegetables, plant-based proteins, whole-grain carbohydrates was discussed in detail with the patient.   A list for source of those nutrients were also provided to the patient.  Patient will use only water or unsweetened tea for hydration. B.  The need to stay away from risky substances including alcohol, smoking; obtaining 7 to 9 hours of restorative sleep, at least 150 minutes of moderate intensity exercise weekly, the  importance of healthy social connections,  and stress reduction techniques were discussed. C.  A full color page of  Calorie density of various food groups per pound showing examples of each food groups was provided to the patient.  - Nutritional counseling repeated at each appointment due to patients tendency to fall back in to old habits.  - The patient admits there is a room for improvement in their diet and drink choices. -  Suggestion is made for the patient to avoid simple carbohydrates from their diet including Cakes, Sweet Desserts / Pastries, Ice Cream, Soda (diet and regular), Sweet Tea, Candies, Chips, Cookies, Sweet Pastries, Store Bought Juices, Alcohol in Excess of 1-2 drinks a day, Artificial Sweeteners, Coffee Creamer, and "Sugar-free" Products. This will help patient to have stable blood glucose profile and potentially avoid unintended weight gain.   - I encouraged the patient to switch to unprocessed or minimally processed complex starch and increased protein intake (animal or plant source), fruits, and vegetables.   - Patient is advised to stick to a routine mealtimes to eat 3 meals a day and avoid unnecessary snacks (to snack only to correct hypoglycemia).  - she has already been set up with Norm Salt, RDN, CDE for diabetes education.  - I have approached her with the following individualized plan to manage her diabetes and patient agrees:   -She is advised to continue Metformin 1000 mg po twice daily with meals Lantus 15 units SQ nightly.  She is advised to stop the Glipizide altogether at this time due to tendency for hypoglycemia.  -she is encouraged to continue monitoring glucose twice daily, before breakfast and before bed, and to call the clinic if she has readings less than 70 or above 300 for 3 tests in a row.  - Adjustment parameters are given to her  for hypo and hyperglycemia in writing.  - her [place] Pepper will be discontinued, risk outweighs benefit for this  patient.  She notes significant vaginal yeast with this medication, requiring prescriptions to alleviate.  - she is not an ideal candidate for incretin therapy due to body habitus and BMI less than 25.  She also notes allergy to Ozempic in the past.  - Specific targets for  A1c; LDL, HDL, and Triglycerides were discussed with the patient.  2) Blood Pressure /Hypertension:  her blood pressure is at target.   she is advised to continue her current medications including Enalapril 20 mg p.o. daily with breakfast.  3) Lipids/Hyperlipidemia:    Review of her recent lipid panel from 03/05/23 showed controlled LDL at 48 . She stopped taking her Lipitor as her refills were out.  She wants to see if she actually needs it before restarting.   4)  Weight/Diet:  her Body mass index is 25.28 kg/m.  -   she is NOT a candidate for weight loss.  Exercise, and detailed carbohydrates information provided  -  detailed on discharge instructions.  5) Chronic Care/Health Maintenance: -she is on ACEI/ARB and Statin medications and is encouraged to initiate and continue to follow up with Ophthalmology, Dentist, Podiatrist at least yearly or according to recommendations, and advised to stay away from smoking. I have recommended yearly flu vaccine and pneumonia vaccine at least every 5 years; moderate intensity exercise for up to 150 minutes weekly; and sleep for at least 7 hours a day.  - she is advised to maintain close follow up with Tommie Sams, DO for primary care needs, as well as her other providers for optimal and coordinated care.     I spent  25  minutes in the care of the patient today including review of labs from CMP, Lipids, Thyroid Function, Hematology (current and previous including abstractions from other facilities); face-to-face time discussing  her blood glucose readings/logs, discussing hypoglycemia and hyperglycemia episodes and symptoms, medications doses, her options of short and long term  treatment based on the latest standards of care / guidelines;  discussion about incorporating lifestyle medicine;  and documenting the encounter. Risk reduction counseling performed per USPSTF guidelines to reduce obesity and cardiovascular risk factors.     Please refer to Patient Instructions for Blood Glucose Monitoring and Insulin/Medications Dosing Guide"  in media tab for additional information. Please  also refer to " Patient Self Inventory" in the Media  tab for reviewed elements of pertinent patient history.  Mary Vazquez participated in the discussions, expressed understanding, and voiced agreement with the above plans.  All questions were answered to her satisfaction. she is encouraged to contact clinic should she have any questions or concerns prior to her return visit.     Follow up plan: - Return in about 4 months (around 11/13/2023) for Diabetes F/U with A1c in office, No previsit labs, Bring meter and logs.   Ronny Bacon, [facility] [address] [place], [place] [address] Phone: [phone] Fax: [phone]  07/13/2023, 11:52 AM

## 2023-07-14 ENCOUNTER — Encounter: Payer: Self-pay | Admitting: Nurse Practitioner

## 2023-07-21 ENCOUNTER — Encounter: Payer: Self-pay | Admitting: Family Medicine

## 2023-08-04 ENCOUNTER — Other Ambulatory Visit (HOSPITAL_COMMUNITY): Payer: Self-pay | Admitting: Family Medicine

## 2023-08-04 DIAGNOSIS — Z1231 Encounter for screening mammogram for malignant neoplasm of breast: Secondary | ICD-10-CM

## 2023-08-13 ENCOUNTER — Encounter (HOSPITAL_COMMUNITY): Payer: Self-pay

## 2023-08-13 ENCOUNTER — Ambulatory Visit (HOSPITAL_COMMUNITY)
Admission: RE | Admit: 2023-08-13 | Discharge: 2023-08-13 | Disposition: A | Discharge status: Home / Self Care | Payer: Medicare Other | Source: Ambulatory Visit | Attending: Family Medicine | Admitting: Family Medicine

## 2023-08-13 DIAGNOSIS — Z1231 Encounter for screening mammogram for malignant neoplasm of breast: Secondary | ICD-10-CM | POA: Diagnosis not present

## 2023-08-20 ENCOUNTER — Encounter: Payer: Self-pay | Admitting: Emergency Medicine

## 2023-08-20 ENCOUNTER — Ambulatory Visit
Admission: EM | Admit: 2023-08-20 | Discharge: 2023-08-20 | Disposition: A | Discharge status: Home / Self Care | Payer: Medicare Other | Attending: Physician Assistant | Admitting: Physician Assistant

## 2023-08-20 DIAGNOSIS — N390 Urinary tract infection, site not specified: Secondary | ICD-10-CM | POA: Diagnosis present

## 2023-08-20 DIAGNOSIS — R350 Frequency of micturition: Secondary | ICD-10-CM | POA: Insufficient documentation

## 2023-08-20 LAB — POCT URINALYSIS DIP (MANUAL ENTRY)
Bilirubin, UA: NEGATIVE
Glucose, UA: NEGATIVE mg/dL
Ketones, POC UA: NEGATIVE mg/dL
Nitrite, UA: POSITIVE — AB
Spec Grav, UA: 1.025 (ref 1.010–1.025)
Urobilinogen, UA: 0.2 U/dL
pH, UA: 6 (ref 5.0–8.0)

## 2023-08-20 MED ORDER — CEPHALEXIN 500 MG PO CAPS: 500.0000 mg | ORAL_CAPSULE | Freq: Two times a day (BID) | ORAL | 0 refills | Status: DC

## 2023-08-20 NOTE — Discharge Instructions (Signed)
We are treating you for a urinary tract infection.  Please start cephalexin twice daily for 7 days.  We will send this for culture and contact you if we need to change her antibiotics based on these results.  Make sure that you are drinking plenty of fluid.  If your symptoms are not improving within a few days please return for reevaluation.  If anything worsens you develop fever, abdominal pain, nausea/vomiting interfering with oral intake, weakness you need to be seen immediately.  You did have some blood in your urine which is common with a urinary tract infection.  Please follow-up with your primary care in 4 weeks for recheck to make sure that this goes away once we have treated the infection.

## 2023-08-20 NOTE — ED Triage Notes (Signed)
Lower ABD pain, frequent urination with some odor to urine since Monday.

## 2023-08-20 NOTE — ED Provider Notes (Signed)
[facility]    CSN: [id number] Arrival date & time: 08/20/23  4098      History   Chief Complaint No chief complaint on file.   HPI Mary Vazquez is a 67 y.o. female.   Patient presents today with a 3 to 4-day history of UTI symptoms.  She reports frequency, malodorous urine, lower abdominal pain.  Denies any dysuria, hematuria, fever, nausea, vomiting.  She does have a history of UTIs with last episode in March 2024.  States current symptoms are similar to previous episodes of this condition.  Denies any recent urogenital procedure, self-catheterization, history of nephrolithiasis.  She is never seen a urologist.  Denies additional antibiotics since March 2024 at which point she was treated with Macrobid.  She does have a history of diabetes with does not take SGLT2 inhibitor.  She denies any pelvic pain, vaginal discharge, abnormal bleeding.    Past Medical History:  Diagnosis Date   Diabetes (HCC)    Hypertension    PONV (postoperative nausea and vomiting)     Patient Active Problem List   Diagnosis Date Noted   Right shoulder pain 04/05/2023   Lipoma 08/26/2022   Hand pain 05/19/2022   Hyperlipidemia 11/18/2021   Hypertension 03/02/2013   Diabetes (HCC) 03/02/2013   GERD (gastroesophageal reflux disease) 03/02/2013    Past Surgical History:  Procedure Laterality Date   COLONOSCOPY   08/08/2007   SLF:  A 5-mm sessile descending colon polyp removed via cold forceps.Normal retroflexed view of the rectum/ Small internal hemorrhoids, PATH: tubular adenoma   COLONOSCOPY  01/09/2013   Procedure: COLONOSCOPY;  Surgeon: West Bali, MD;  Location: [facility];  Service: Endoscopy;  Laterality: N/A;  8:30   COLONOSCOPY WITH PROPOFOL N/A 01/12/2023   Procedure: COLONOSCOPY WITH PROPOFOL;  Surgeon: Lanelle Bal, DO;  Location: [facility];  Service: Endoscopy;  Laterality: N/A;  10:30am., asa 2   POLYPECTOMY  01/12/2023   Procedure: POLYPECTOMY;   Surgeon: Lanelle Bal, DO;  Location: [facility];  Service: Endoscopy;;   TUBAL LIGATION      OB History     Gravida  3   Para  3   Term  3   Preterm      AB      Living         SAB      IAB      Ectopic      Multiple      Live Births               Home Medications    Prior to Admission medications   Medication Sig Start Date End Date Taking? Authorizing Provider  cephALEXin (KEFLEX) 500 MG capsule Take 1 capsule (500 mg total) by mouth 2 (two) times daily. 08/20/23  Yes Letizia Hook, Noberto Retort, PA-C  acetaminophen (TYLENOL) 650 MG CR tablet Take 1,300 mg by mouth every 8 (eight) hours as needed for pain.    [provider]  cetirizine (ZYRTEC) 10 MG tablet Take 1 tablet (10 mg total) by mouth daily as needed for allergies. 04/30/23   Tommie Sams, DO  Cholecalciferol (VITAMIN D3 PO) Take 2,000 Units by mouth daily. Patient reports that this is ain liquid form    [provider]  enalapril (VASOTEC) 20 MG tablet Take 1 tablet (20 mg total) by mouth 2 (two) times daily. 04/05/23   Tommie Sams, DO  insulin glargine (LANTUS SOLOSTAR) 100 UNIT/ML Solostar  Pen Inject 15 Units into the skin at bedtime. 07/13/23   Dani Gobble, NP  Insulin Pen Needle (PEN NEEDLES) 31G X 6 MM MISC Use to inject insulin once daily 03/02/23   Dani Gobble, NP  MAGNESIUM PO Take 260 mg by mouth daily. Patient not taking: Reported on 07/13/2023    [provider]  meloxicam (MOBIC) 15 MG tablet Take 1 tablet (15 mg total) by mouth daily as needed for pain. 06/22/23   Tommie Sams, DO  metFORMIN (GLUCOPHAGE) 500 MG tablet Take 2 tablets (1,000 mg total) by mouth 2 (two) times daily with a meal. 01/06/23 07/13/23  Dani Gobble, NP  OVER THE COUNTER MEDICATION Garlic and fish oil Patient not taking: Reported on 07/13/2023    [provider]  OVER THE COUNTER MEDICATION Calcium 600 mg viatmin d Patient not taking: Reported on 07/13/2023    [provider]  Polyethyl Glycol-Propyl Glycol (SYSTANE OP) Apply to eye.    [provider]  Propylene Glycol (SYSTANE BALANCE) 0.6 % SOLN Place 1 drop into both eyes daily as needed (dry eyes).    [provider]    Family History Family History  Problem Relation Age of Onset   Cancer Father        prostate   Diabetes Sister    Diabetes Maternal Grandmother    Diabetes Paternal Aunt    Colon cancer Neg Hx     Social History Social History   Tobacco Use   Smoking status: Never   Smokeless tobacco: Never  Vaping Use   Vaping status: Never Used  Substance Use Topics   Alcohol use: No   Drug use: No     Allergies   Ozempic (0.25 or 0.5 mg-dose) [semaglutide(0.25 or 0.5mg -dos)] and Septra [sulfamethoxazole-trimethoprim]   Review of Systems Review of Systems  Constitutional:  Positive for activity change. Negative for appetite change, fatigue and fever.  Gastrointestinal:  Negative for abdominal pain, diarrhea, nausea and vomiting.  Genitourinary:  Positive for frequency and urgency. Negative for dysuria, flank pain, hematuria, pelvic pain, vaginal bleeding, vaginal discharge and vaginal pain.  Musculoskeletal:  Negative for arthralgias, back pain and myalgias.     Physical Exam Triage Vital Signs ED Triage Vitals  Encounter Vitals Group     BP 08/20/23 0832 123/83     Systolic BP Percentile --      Diastolic BP Percentile --      Pulse Rate 08/20/23 0832 95     Resp 08/20/23 0832 18     Temp 08/20/23 0832 99.1 F (37.3 C)     Temp Source 08/20/23 0832 Oral     SpO2 08/20/23 0832 98 %     Weight --      Height --      Head Circumference --      Peak Flow --      Pain Score 08/20/23 0833 5     Pain Loc --      Pain Education --      Exclude from Growth Chart --    No data found.  Updated Vital Signs BP 123/83 (BP Location: Right Arm)   Pulse 95   Temp 99.1 F (37.3 C) (Oral)   Resp 18   SpO2 98%   Visual Acuity Right Eye  Distance:   Left Eye Distance:   Bilateral Distance:    Right Eye Near:   Left Eye Near:    Bilateral Near:  Physical Exam Vitals reviewed.  Constitutional:      General: She is awake. She is not in acute distress.    Appearance: Normal appearance. She is well-developed. She is not ill-appearing.     Comments: Very pleasant female appears stated age in no acute distress sitting comfortably in exam room  HENT:     Head: Normocephalic and atraumatic.  Cardiovascular:     Rate and Rhythm: Normal rate and regular rhythm.     Heart sounds: Normal heart sounds, S1 normal and S2 normal. No murmur heard. Pulmonary:     Effort: Pulmonary effort is normal.     Breath sounds: Normal breath sounds. No wheezing, rhonchi or rales.     Comments: Clear to auscultation bilaterally Abdominal:     General: Bowel sounds are normal.     Palpations: Abdomen is soft.     Tenderness: There is no abdominal tenderness. There is no right CVA tenderness, left CVA tenderness, guarding or rebound.     Comments: Benign abdominal exam  Psychiatric:        Behavior: Behavior is cooperative.      UC Treatments / Results  Labs (all labs ordered are listed, but only abnormal results are displayed) Labs Reviewed  POCT URINALYSIS DIP (MANUAL ENTRY) - Abnormal; Notable for the following components:      Result Value   Clarity, UA cloudy (*)    Blood, UA moderate (*)    Protein Ur, POC trace (*)    Nitrite, UA Positive (*)    Leukocytes, UA Moderate (2+) (*)    All other components within normal limits  URINE CULTURE    EKG   Radiology No results found.  Procedures Procedures (including critical care time)  Medications Ordered in UC Medications - No data to display  Initial Impression / Assessment and Plan / UC Course  I have reviewed the triage vital signs and the nursing notes.  Pertinent labs & imaging results that were available during my care of the patient were reviewed by me and  considered in my medical decision making (see chart for details).     Patient is well-appearing, afebrile, nontoxic, nontachycardic.  UA showed evidence of acute UTI.  Will treat with cephalexin 500 mg twice daily for 7 days.  Will send her urine for culture and we discussed that if we need to change her antibiotics based on susceptibilities identified on culture we will contact her I thought she should complete the course of medication that was prescribed today.  She is to push fluids.  She did have some blood on the UA and we discussed that this is likely related to the UTI but she should follow-up with her PCP in 4 weeks for recheck once we have clear the infection to ensure that this resolves.  Given her recurrent symptoms if this continues to be a regular problem it would be worthwhile for her to follow-up with urology.  Discussed that if she has any worsening or changing symptoms including fever, hematuria, abdominal pain, nausea, vomiting she needs to be seen emergently.  Strict return precautions given.  Patient declined work excuse note.  Final Clinical Impressions(s) / UC Diagnoses   Final diagnoses:  Acute UTI  Urinary frequency     Discharge Instructions      We are treating you for a urinary tract infection.  Please start cephalexin twice daily for 7 days.  We will send this for culture and contact you if we need to change  her antibiotics based on these results.  Make sure that you are drinking plenty of fluid.  If your symptoms are not improving within a few days please return for reevaluation.  If anything worsens you develop fever, abdominal pain, nausea/vomiting interfering with oral intake, weakness you need to be seen immediately.  You did have some blood in your urine which is common with a urinary tract infection.  Please follow-up with your primary care in 4 weeks for recheck to make sure that this goes away once we have treated the infection.     ED Prescriptions      Medication Sig Dispense Auth. Provider   cephALEXin (KEFLEX) 500 MG capsule Take 1 capsule (500 mg total) by mouth 2 (two) times daily. 14 capsule Gurjot Brisco K, PA-C      PDMP not reviewed this encounter.   Jeani Hawking, PA-C 08/20/23 4540

## 2023-08-23 LAB — URINE CULTURE: Culture: >=100000 — AB

## 2023-09-05 ENCOUNTER — Ambulatory Visit
Admission: EM | Admit: 2023-09-05 | Discharge: 2023-09-05 | Disposition: A | Discharge status: Home / Self Care | Payer: Medicare Other | Attending: Family Medicine | Admitting: Family Medicine

## 2023-09-05 DIAGNOSIS — N390 Urinary tract infection, site not specified: Secondary | ICD-10-CM | POA: Insufficient documentation

## 2023-09-05 LAB — POCT URINALYSIS DIP (MANUAL ENTRY)
Bilirubin, UA: NEGATIVE
Blood, UA: NEGATIVE
Glucose, UA: 500 mg/dL — AB
Ketones, POC UA: NEGATIVE mg/dL
Nitrite, UA: NEGATIVE
Protein Ur, POC: NEGATIVE mg/dL
Spec Grav, UA: 1.025 (ref 1.010–1.025)
Urobilinogen, UA: 0.2 E.U./dL
pH, UA: 5.5 (ref 5.0–8.0)

## 2023-09-05 MED ORDER — NITROFURANTOIN MONOHYD MACRO 100 MG PO CAPS: 100.0000 mg | ORAL_CAPSULE | Freq: Two times a day (BID) | ORAL | 0 refills | Status: DC

## 2023-09-05 NOTE — ED Provider Notes (Signed)
[facility]    CSN: [id number] Arrival date & time: 09/05/23  1236      History   Chief Complaint No chief complaint on file.   HPI Mary Vazquez is a 67 y.o. female.   Patient presenting today with 1 day history of dysuria.  Denies nausea, vomiting, hematuria, fever, chills, abdominal pain.  Was seen for the same on 08/20/2023 and given Keflex which she felt slightly better but never fully resolved on.  She states a very similar episode happened back in March where she was seen for a UTI, given Keflex, did not resolve and was seen again about a week later and given Macrobid which finally did seem to resolve things.    Past Medical History:  Diagnosis Date   Diabetes (HCC)    Hypertension    PONV (postoperative nausea and vomiting)     Patient Active Problem List   Diagnosis Date Noted   Right shoulder pain 04/05/2023   Lipoma 08/26/2022   Hand pain 05/19/2022   Hyperlipidemia 11/18/2021   Hypertension 03/02/2013   Diabetes (HCC) 03/02/2013   GERD (gastroesophageal reflux disease) 03/02/2013    Past Surgical History:  Procedure Laterality Date   COLONOSCOPY   08/08/2007   SLF:  A 5-mm sessile descending colon polyp removed via cold forceps.Normal retroflexed view of the rectum/ Small internal hemorrhoids, PATH: tubular adenoma   COLONOSCOPY  01/09/2013   Procedure: COLONOSCOPY;  Surgeon: West Bali, MD;  Location: [facility];  Service: Endoscopy;  Laterality: N/A;  8:30   COLONOSCOPY WITH PROPOFOL N/A 01/12/2023   Procedure: COLONOSCOPY WITH PROPOFOL;  Surgeon: Lanelle Bal, DO;  Location: [facility];  Service: Endoscopy;  Laterality: N/A;  10:30am., asa 2   POLYPECTOMY  01/12/2023   Procedure: POLYPECTOMY;  Surgeon: Lanelle Bal, DO;  Location: [facility];  Service: Endoscopy;;   TUBAL LIGATION      OB History     Gravida  3   Para  3   Term  3   Preterm      AB      Living         SAB      IAB      Ectopic       Multiple      Live Births               Home Medications    Prior to Admission medications   Medication Sig Start Date End Date Taking? Authorizing Provider  nitrofurantoin, macrocrystal-monohydrate, (MACROBID) 100 MG capsule Take 1 capsule (100 mg total) by mouth 2 (two) times daily. 09/05/23  Yes Particia Nearing, PA-C  acetaminophen (TYLENOL) 650 MG CR tablet Take 1,300 mg by mouth every 8 (eight) hours as needed for pain.    [provider]  cephALEXin (KEFLEX) 500 MG capsule Take 1 capsule (500 mg total) by mouth 2 (two) times daily. 08/20/23   Raspet, Noberto Retort, PA-C  cetirizine (ZYRTEC) 10 MG tablet Take 1 tablet (10 mg total) by mouth daily as needed for allergies. 04/30/23   Tommie Sams, DO  Cholecalciferol (VITAMIN D3 PO) Take 2,000 Units by mouth daily. Patient reports that this is ain liquid form    [provider]  enalapril (VASOTEC) 20 MG tablet Take 1 tablet (20 mg total) by mouth 2 (two) times daily. 04/05/23   Tommie Sams, DO  insulin glargine (LANTUS SOLOSTAR) 100 UNIT/ML Solostar Pen Inject 15 Units into  the skin at bedtime. 07/13/23   Dani Gobble, NP  Insulin Pen Needle (PEN NEEDLES) 31G X 6 MM MISC Use to inject insulin once daily 03/02/23   Dani Gobble, NP  MAGNESIUM PO Take 260 mg by mouth daily. Patient not taking: Reported on 07/13/2023    [provider]  meloxicam (MOBIC) 15 MG tablet Take 1 tablet (15 mg total) by mouth daily as needed for pain. 06/22/23   Tommie Sams, DO  metFORMIN (GLUCOPHAGE) 500 MG tablet Take 2 tablets (1,000 mg total) by mouth 2 (two) times daily with a meal. 01/06/23 07/13/23  Dani Gobble, NP  OVER THE COUNTER MEDICATION Garlic and fish oil Patient not taking: Reported on 07/13/2023    [provider]  OVER THE COUNTER MEDICATION Calcium 600 mg viatmin d Patient not taking: Reported on 07/13/2023    [provider]  Polyethyl Glycol-Propyl Glycol (SYSTANE OP) Apply to  eye.    [provider]  Propylene Glycol (SYSTANE BALANCE) 0.6 % SOLN Place 1 drop into both eyes daily as needed (dry eyes).    [provider]    Family History Family History  Problem Relation Age of Onset   Cancer Father        prostate   Diabetes Sister    Diabetes Maternal Grandmother    Diabetes Paternal Aunt    Colon cancer Neg Hx     Social History Social History   Tobacco Use   Smoking status: Never   Smokeless tobacco: Never  Vaping Use   Vaping status: Never Used  Substance Use Topics   Alcohol use: No   Drug use: No     Allergies   Ozempic (0.25 or 0.5 mg-dose) [semaglutide(0.25 or 0.5mg -dos)] and Septra [sulfamethoxazole-trimethoprim]   Review of Systems Review of Systems PER HPI  Physical Exam Triage Vital Signs ED Triage Vitals [09/05/23 1355]  Encounter Vitals Group     BP (!) 161/83     Systolic BP Percentile      Diastolic BP Percentile      Pulse Rate (!) 54     Resp 18     Temp 98.3 F (36.8 C)     Temp Source Oral     SpO2 98 %     Weight      Height      Head Circumference      Peak Flow      Pain Score 0     Pain Loc      Pain Education      Exclude from Growth Chart    No data found.  Updated Vital Signs BP (!) 161/83 (BP Location: Right Arm)   Pulse (!) 54   Temp 98.3 F (36.8 C) (Oral)   Resp 18   SpO2 98%   Visual Acuity Right Eye Distance:   Left Eye Distance:   Bilateral Distance:    Right Eye Near:   Left Eye Near:    Bilateral Near:     Physical Exam Vitals and nursing note reviewed.  Constitutional:      Appearance: Normal appearance. She is not ill-appearing.  HENT:     Head: Atraumatic.  Eyes:     Extraocular Movements: Extraocular movements intact.     Conjunctiva/sclera: Conjunctivae normal.  Cardiovascular:     Rate and Rhythm: Normal rate and regular rhythm.     Heart sounds: Normal heart sounds.  Pulmonary:     Effort: Pulmonary effort is normal.  Breath sounds:  Normal breath sounds.  Abdominal:     General: Bowel sounds are normal. There is no distension.     Palpations: Abdomen is soft.     Tenderness: There is no abdominal tenderness. There is no right CVA tenderness, left CVA tenderness or guarding.  Musculoskeletal:        General: Normal range of motion.     Cervical back: Normal range of motion and neck supple.  Skin:    General: Skin is warm and dry.  Neurological:     Mental Status: She is alert and oriented to person, place, and time.  Psychiatric:        Mood and Affect: Mood normal.        Thought Content: Thought content normal.        Judgment: Judgment normal.      UC Treatments / Results  Labs (all labs ordered are listed, but only abnormal results are displayed) Labs Reviewed  POCT URINALYSIS DIP (MANUAL ENTRY) - Abnormal; Notable for the following components:      Result Value   Clarity, UA cloudy (*)    Glucose, UA =500 (*)    Leukocytes, UA Small (1+) (*)    All other components within normal limits  URINE CULTURE    EKG   Radiology No results found.  Procedures Procedures (including critical care time)  Medications Ordered in UC Medications - No data to display  Initial Impression / Assessment and Plan / UC Course  I have reviewed the triage vital signs and the nursing notes.  Pertinent labs & imaging results that were available during my care of the patient were reviewed by me and considered in my medical decision making (see chart for details).     Urinalysis with evidence of urinary tract infection.  Will treat with Macrobid as this seemed to help significantly earlier this year with similar situation.  Culture pending.  Follow-up for worsening symptoms.  Final Clinical Impressions(s) / UC Diagnoses   Final diagnoses:  Acute lower UTI   Discharge Instructions   None    ED Prescriptions     Medication Sig Dispense Auth. Provider   nitrofurantoin, macrocrystal-monohydrate, (MACROBID) 100  MG capsule Take 1 capsule (100 mg total) by mouth 2 (two) times daily. 10 capsule Particia Nearing, [place]      PDMP not reviewed this encounter.   Particia Nearing, [place] 09/05/23 1442

## 2023-09-05 NOTE — ED Triage Notes (Signed)
Pt reports she has vaginal discomfort after urination x 1 day    States she feels like the kelfex did not clear her sxs from 9/6.

## 2023-09-07 LAB — URINE CULTURE: Culture: >=100000 — AB

## 2023-09-29 ENCOUNTER — Encounter: Payer: Self-pay | Admitting: Family Medicine

## 2023-10-07 ENCOUNTER — Other Ambulatory Visit: Payer: Self-pay | Admitting: Nurse Practitioner

## 2023-10-07 DIAGNOSIS — E119 Type 2 diabetes mellitus without complications: Secondary | ICD-10-CM

## 2023-10-14 ENCOUNTER — Other Ambulatory Visit: Payer: Self-pay | Admitting: Family Medicine

## 2023-10-14 DIAGNOSIS — I10 Essential (primary) hypertension: Secondary | ICD-10-CM

## 2023-10-26 ENCOUNTER — Encounter: Payer: Self-pay | Admitting: Nurse Practitioner

## 2023-10-26 ENCOUNTER — Encounter: Payer: Self-pay | Admitting: Family Medicine

## 2023-10-27 MED ORDER — BASAGLAR KWIKPEN 100 UNIT/ML ~~LOC~~ SOPN: 15.0000 [IU] | PEN_INJECTOR | Freq: Every day | SUBCUTANEOUS | 3 refills | Status: DC

## 2023-11-01 ENCOUNTER — Ambulatory Visit: Payer: Medicare Other | Admitting: Family Medicine

## 2023-11-01 ENCOUNTER — Ambulatory Visit (HOSPITAL_COMMUNITY)
Admission: RE | Admit: 2023-11-01 | Discharge: 2023-11-01 | Disposition: A | Discharge status: Home / Self Care | Payer: Medicare Other | Source: Ambulatory Visit | Attending: Family Medicine | Admitting: Family Medicine

## 2023-11-01 VITALS — BP 180/80 | HR 59 | Ht 66.5 in | Wt 159.0 lb

## 2023-11-01 DIAGNOSIS — R0609 Other forms of dyspnea: Secondary | ICD-10-CM | POA: Diagnosis not present

## 2023-11-01 DIAGNOSIS — Z23 Encounter for immunization: Secondary | ICD-10-CM | POA: Diagnosis not present

## 2023-11-01 DIAGNOSIS — E119 Type 2 diabetes mellitus without complications: Secondary | ICD-10-CM

## 2023-11-01 DIAGNOSIS — E1159 Type 2 diabetes mellitus with other circulatory complications: Secondary | ICD-10-CM | POA: Diagnosis not present

## 2023-11-01 DIAGNOSIS — E782 Mixed hyperlipidemia: Secondary | ICD-10-CM | POA: Diagnosis not present

## 2023-11-01 DIAGNOSIS — I10 Essential (primary) hypertension: Secondary | ICD-10-CM

## 2023-11-01 MED ORDER — AMLODIPINE BESYLATE 5 MG PO TABS: 5.0000 mg | ORAL_TABLET | Freq: Every day | ORAL | 1 refills | Status: DC

## 2023-11-01 NOTE — Assessment & Plan Note (Addendum)
Concern for underlying cardiac abnormality given symptomatology.   EKG was obtained today.  Interpretation: Sinus bradycardia at the rate of 58.  Mild flattening of the T waves.  No T wave inversion.  Normal intervals.   Labs and chest xray today for additional evaluation. Will likely need cardiology referral.  Awaiting until results are back.

## 2023-11-01 NOTE — Progress Notes (Signed)
Subjective:  Patient ID: Mary Vazquez, female    DOB: Apr 23, 1956  Age: 67 y.o. MRN: [id number]  CC:   Chief Complaint  Patient presents with   Diabetes   Hypertension   Hyperlipidemia    Was given atorvastatin at [facility] unsure if needs to take it     HPI:  67 year old female presents for follow-up.  Patient following with endocrinology regarding type 2 diabetes.  Last A1c 7.8.  She is currently on metformin and Basaglar.  Needs A1c today.  Blood sugar this morning was 138.  Patient's blood pressure is uncontrolled here today.  This is unusual for her.  She is compliant with enalapril.  She has had no blood pressures this high at home.  Patient reports that over the past couple of months she has had dyspnea on exertion.  She walks a trail approximately 5 times a week.  She states when she is going up a hill/incline she gets winded.  She has no other difficulties doing other activities.  She states that she has some associated chest heaviness when this occurs.  No other reported symptoms.  Patient Active Problem List   Diagnosis Date Noted   DOE (dyspnea on exertion) 11/01/2023   Right shoulder pain 04/05/2023   Lipoma 08/26/2022   Hand pain 05/19/2022   Hyperlipidemia 11/18/2021   Hypertension 03/02/2013   Diabetes (HCC) 03/02/2013   GERD (gastroesophageal reflux disease) 03/02/2013    Social Hx   Social History   Socioeconomic History   Marital status: Widowed    Spouse name: Not on file   Number of children: 3   Years of education: Not on file   Highest education level: 12th grade  Occupational History   Not on file  Tobacco Use   Smoking status: Never   Smokeless tobacco: Never  Vaping Use   Vaping status: Never Used  Substance and Sexual Activity   Alcohol use: No   Drug use: No   Sexual activity: Not Currently    Birth control/protection: Post-menopausal, Surgical    Comment: tubal  Other Topics Concern   Not on file  Social History Narrative    Husband passed 01/2020.   3 children    2 grandchildren   Social Determinants of Health   Financial Resource Strain: Low Risk  (11/01/2023)   Overall Financial Resource Strain (CARDIA)    Difficulty of Paying Living Expenses: Not very hard  Food Insecurity: No Food Insecurity (11/01/2023)   Hunger Vital Sign    Worried About Running Out of Food in the Last Year: Never true    Ran Out of Food in the Last Year: Never true  Transportation Needs: No Transportation Needs (11/01/2023)   PRAPARE - Administrator, Civil Service (Medical): No    Lack of Transportation (Non-Medical): No  Physical Activity: Sufficiently Active (11/01/2023)   Exercise Vital Sign    Days of Exercise per Week: 5 days    Minutes of Exercise per Session: 70 min  Stress: No Stress Concern Present (11/01/2023)   [employer] of Occupational Health - Occupational Stress Questionnaire    Feeling of Stress : Only a little  Social Connections: Moderately Integrated (11/01/2023)   Social Connection and Isolation Panel [NHANES]    Frequency of Communication with Friends and Family: Twice a week    Frequency of Social Gatherings with Friends and Family: More than three times a week    Attends Religious Services: More than 4 times per year  Active Member of Clubs or Organizations: Yes    Attends Banker Meetings: More than 4 times per year    Marital Status: Widowed    Review of Systems Per HPI  Objective:  BP (!) 180/80   Pulse (!) 59   Ht 5' 6.5" (1.689 m)   Wt 159 lb (72.1 kg)   SpO2 99%   BMI 25.28 kg/m      11/01/2023   10:50 AM 11/01/2023    9:51 AM 09/05/2023    1:55 PM  BP/Weight  Systolic BP 180 201 161  Diastolic BP 80 90 83  Wt. (Lbs)  159   BMI  25.28 kg/m2     Physical Exam Vitals and nursing note reviewed.  Constitutional:      General: She is not in acute distress.    Appearance: Normal appearance.  HENT:     Head: Normocephalic and atraumatic.  Eyes:      General:        Right eye: No discharge.        Left eye: No discharge.     Conjunctiva/sclera: Conjunctivae normal.  Cardiovascular:     Rate and Rhythm: Regular rhythm. Bradycardia present.  Pulmonary:     Effort: Pulmonary effort is normal.     Breath sounds: Normal breath sounds. No wheezing, rhonchi or rales.  Musculoskeletal:     Right lower leg: No edema.     Left lower leg: No edema.  Neurological:     Mental Status: She is alert.  Psychiatric:        Mood and Affect: Mood normal.        Behavior: Behavior normal.     Lab Results  Component Value Date   WBC 4.5 03/05/2023   HGB 12.5 03/05/2023   HCT 37.2 03/05/2023   PLT 332 03/05/2023   GLUCOSE 199 (H) 03/05/2023   CHOL 133 03/05/2023   TRIG 100 03/05/2023   HDL 67 03/05/2023   LDLCALC 48 03/05/2023   ALT 12 03/05/2023   AST 13 03/05/2023   NA 142 03/05/2023   K 4.9 03/05/2023   CL 106 03/05/2023   CREATININE 0.77 03/05/2023   BUN 12 03/05/2023   CO2 25 03/05/2023   HGBA1C 7.8 (A) 07/13/2023   MICROALBUR 30 mg/L 10/05/2022     Assessment & Plan:   Problem List Items Addressed This Visit       Cardiovascular and Mediastinum   Hypertension    Markedly elevated here today.  Etiology unclear regarding the exacerbation of her hypertension.  Adding amlodipine.      Relevant Medications   amLODipine (NORVASC) 5 MG tablet   atorvastatin (LIPITOR) 20 MG tablet     Endocrine   Diabetes (HCC)    A1c today to assess.      Relevant Medications   atorvastatin (LIPITOR) 20 MG tablet   Other Relevant Orders   CMP14+EGFR   Hemoglobin A1c   Microalbumin / creatinine urine ratio     Other   DOE (dyspnea on exertion) - Primary    Concern for underlying cardiac abnormality given symptomatology.   EKG was obtained today.  Interpretation: Sinus bradycardia at the rate of 58.  Mild flattening of the T waves.  No T wave inversion.  Normal intervals.   Labs and chest xray today for additional  evaluation. Will likely need cardiology referral.  Awaiting until results are back.      Relevant Orders   EKG 12-Lead (Completed)  CBC   Brain natriuretic peptide   DG Chest 2 View   Hyperlipidemia    Has been well-controlled.  Labs today.  On Lipitor.      Relevant Medications   amLODipine (NORVASC) 5 MG tablet   atorvastatin (LIPITOR) 20 MG tablet   Other Relevant Orders   Lipid panel   Other Visit Diagnoses     Flu vaccine need       Relevant Orders   Flu Vaccine Trivalent High Dose (Fluad) (Completed)       Meds ordered this encounter  Medications   amLODipine (NORVASC) 5 MG tablet    Sig: Take 1 tablet (5 mg total) by mouth daily.    Dispense:  90 tablet    Refill:  1    Follow-up:  Return in about 2 weeks (around 11/15/2023) for HTN follow up.  Everlene Other DO [facility]

## 2023-11-01 NOTE — Assessment & Plan Note (Signed)
A1c today to assess. 

## 2023-11-01 NOTE — Assessment & Plan Note (Signed)
Has been well-controlled.  Labs today.  On Lipitor.

## 2023-11-01 NOTE — Patient Instructions (Signed)
Labs today.  Chest xray at the hospital.  We will call with results.  Monitor BP @ home.  Follow up in 2 weeks.

## 2023-11-01 NOTE — Assessment & Plan Note (Signed)
Markedly elevated here today.  Etiology unclear regarding the exacerbation of her hypertension.  Adding amlodipine.

## 2023-11-03 ENCOUNTER — Other Ambulatory Visit: Payer: Self-pay | Admitting: Family Medicine

## 2023-11-03 DIAGNOSIS — R0609 Other forms of dyspnea: Secondary | ICD-10-CM

## 2023-11-03 LAB — LIPID PANEL
Chol/HDL Ratio: 1.8 ratio (ref 0.0–4.4)
Cholesterol, Total: 172 mg/dL (ref 100–199)
HDL: 93 mg/dL (ref >39)
LDL Chol Calc (NIH): 68 mg/dL (ref 0–99)
Triglycerides: 53 mg/dL (ref 0–149)
VLDL Cholesterol Cal: 11 mg/dL (ref 5–40)

## 2023-11-03 LAB — CBC
Hematocrit: 47.2 % — ABNORMAL HIGH (ref 34.0–46.6)
Hemoglobin: 14.8 g/dL (ref 11.1–15.9)
MCH: 28.7 pg (ref 26.6–33.0)
MCHC: 31.4 g/dL — ABNORMAL LOW (ref 31.5–35.7)
MCV: 92 fL (ref 79–97)
Platelets: 235 10*3/uL (ref 150–450)
RBC: 5.16 x10E6/uL (ref 3.77–5.28)
RDW: 13.8 % (ref 11.7–15.4)
WBC: 4.3 10*3/uL (ref 3.4–10.8)

## 2023-11-03 LAB — CMP14+EGFR
ALT: 16 [IU]/L (ref 0–32)
AST: 24 [IU]/L (ref 0–40)
Albumin: 4.7 g/dL (ref 3.9–4.9)
Alkaline Phosphatase: 73 [IU]/L (ref 44–121)
BUN/Creatinine Ratio: 11 — ABNORMAL LOW (ref 12–28)
BUN: 10 mg/dL (ref 8–27)
Bilirubin Total: 0.4 mg/dL (ref 0.0–1.2)
CO2: 23 mmol/L (ref 20–29)
Calcium: 9.9 mg/dL (ref 8.7–10.3)
Chloride: 106 mmol/L (ref 96–106)
Creatinine, Ser: 0.92 mg/dL (ref 0.57–1.00)
Globulin, Total: 2.9 g/dL (ref 1.5–4.5)
Glucose: 94 mg/dL (ref 70–99)
Potassium: 4.2 mmol/L (ref 3.5–5.2)
Sodium: 146 mmol/L — ABNORMAL HIGH (ref 134–144)
Total Protein: 7.6 g/dL (ref 6.0–8.5)
eGFR: 67 mL/min/{1.73_m2} (ref >59)

## 2023-11-03 LAB — MICROALBUMIN / CREATININE URINE RATIO
Creatinine, Urine: 39.3 mg/dL
Microalb/Creat Ratio: 50 mg/g{creat} — ABNORMAL HIGH (ref 0–29)
Microalbumin, Urine: 19.6 ug/mL

## 2023-11-03 LAB — BRAIN NATRIURETIC PEPTIDE: BNP: 43.9 pg/mL (ref 0.0–100.0)

## 2023-11-03 LAB — HEMOGLOBIN A1C
Est. average glucose Bld gHb Est-mCnc: 194 mg/dL
Hgb A1c MFr Bld: 8.4 % — ABNORMAL HIGH (ref 4.8–5.6)

## 2023-11-09 ENCOUNTER — Other Ambulatory Visit: Payer: Self-pay

## 2023-11-09 DIAGNOSIS — R0609 Other forms of dyspnea: Secondary | ICD-10-CM

## 2023-11-15 ENCOUNTER — Ambulatory Visit: Payer: Medicare Other | Admitting: Nurse Practitioner

## 2023-11-15 ENCOUNTER — Encounter: Payer: Self-pay | Admitting: Nurse Practitioner

## 2023-11-15 VITALS — BP 142/90 | HR 74 | Ht 66.5 in | Wt 158.4 lb

## 2023-11-15 DIAGNOSIS — Z7984 Long term (current) use of oral hypoglycemic drugs: Secondary | ICD-10-CM

## 2023-11-15 DIAGNOSIS — E1122 Type 2 diabetes mellitus with diabetic chronic kidney disease: Secondary | ICD-10-CM

## 2023-11-15 DIAGNOSIS — Z794 Long term (current) use of insulin: Secondary | ICD-10-CM | POA: Diagnosis not present

## 2023-11-15 DIAGNOSIS — L659 Nonscarring hair loss, unspecified: Secondary | ICD-10-CM

## 2023-11-15 DIAGNOSIS — R5382 Chronic fatigue, unspecified: Secondary | ICD-10-CM | POA: Diagnosis not present

## 2023-11-15 DIAGNOSIS — N182 Chronic kidney disease, stage 2 (mild): Secondary | ICD-10-CM

## 2023-11-15 DIAGNOSIS — L603 Nail dystrophy: Secondary | ICD-10-CM

## 2023-11-15 NOTE — Progress Notes (Signed)
Endocrinology Follow Up Note       11/15/2023, 11:31 AM   Subjective:    Patient ID: Mary Vazquez, female    DOB: August 20, 1956.  Mary Vazquez is being seen in follow up after being seen in consultation for management of currently uncontrolled symptomatic diabetes requested by  Tommie Sams, DO.   Past Medical History:  Diagnosis Date   Diabetes (HCC)    Hypertension    PONV (postoperative nausea and vomiting)     Past Surgical History:  Procedure Laterality Date   COLONOSCOPY   08/08/2007   SLF:  A 5-mm sessile descending colon polyp removed via cold forceps.Normal retroflexed view of the rectum/ Small internal hemorrhoids, PATH: tubular adenoma   COLONOSCOPY  01/09/2013   Procedure: COLONOSCOPY;  Surgeon: West Bali, MD;  Location: [facility];  Service: Endoscopy;  Laterality: N/A;  8:30   COLONOSCOPY WITH PROPOFOL N/A 01/12/2023   Procedure: COLONOSCOPY WITH PROPOFOL;  Surgeon: Lanelle Bal, DO;  Location: [facility];  Service: Endoscopy;  Laterality: N/A;  10:30am., asa 2   POLYPECTOMY  01/12/2023   Procedure: POLYPECTOMY;  Surgeon: Lanelle Bal, DO;  Location: [facility];  Service: Endoscopy;;   TUBAL LIGATION      Social History   Socioeconomic History   Marital status: Widowed    Spouse name: Not on file   Number of children: 3   Years of education: Not on file   Highest education level: 12th grade  Occupational History   Not on file  Tobacco Use   Smoking status: Never   Smokeless tobacco: Never  Vaping Use   Vaping status: Never Used  Substance and Sexual Activity   Alcohol use: No   Drug use: No   Sexual activity: Not Currently    Birth control/protection: Post-menopausal, Surgical    Comment: tubal  Other Topics Concern   Not on file  Social History Narrative   Husband passed 01/2020.   3 children    2 grandchildren   Social Determinants of Health    Financial Resource Strain: Low Risk  (11/01/2023)   Overall Financial Resource Strain (CARDIA)    Difficulty of Paying Living Expenses: Not very hard  Food Insecurity: No Food Insecurity (11/01/2023)   Hunger Vital Sign    Worried About Running Out of Food in the Last Year: Never true    Ran Out of Food in the Last Year: Never true  Transportation Needs: No Transportation Needs (11/01/2023)   PRAPARE - Administrator, Civil Service (Medical): No    Lack of Transportation (Non-Medical): No  Physical Activity: Sufficiently Active (11/01/2023)   Exercise Vital Sign    Days of Exercise per Week: 5 days    Minutes of Exercise per Session: 70 min  Stress: No Stress Concern Present (11/01/2023)   [employer] of Occupational Health - Occupational Stress Questionnaire    Feeling of Stress : Only a little  Social Connections: Moderately Integrated (11/01/2023)   Social Connection and Isolation Panel [NHANES]    Frequency of Communication with Friends and Family: Twice a week    Frequency of Social Gatherings with Friends and  Family: More than three times a week    Attends Religious Services: More than 4 times per year    Active Member of Clubs or Organizations: Yes    Attends Banker Meetings: More than 4 times per year    Marital Status: Widowed    Family History  Problem Relation Age of Onset   Cancer Father        prostate   Diabetes Sister    Diabetes Maternal Grandmother    Diabetes Paternal Aunt    Colon cancer Neg Hx     Outpatient Encounter Medications as of 11/15/2023  Medication Sig   acetaminophen (TYLENOL) 650 MG CR tablet Take 1,300 mg by mouth every 8 (eight) hours as needed for pain.   amLODipine (NORVASC) 5 MG tablet Take 1 tablet (5 mg total) by mouth daily.   atorvastatin (LIPITOR) 20 MG tablet Take 20 mg by mouth daily.   cetirizine (ZYRTEC) 10 MG tablet Take 1 tablet (10 mg total) by mouth daily as needed for allergies.    Cholecalciferol (VITAMIN D3 PO) Take 2,000 Units by mouth daily. Patient reports that this is ain liquid form   enalapril (VASOTEC) 20 MG tablet TAKE 1 TABLET BY MOUTH TWICE A DAY   Insulin Glargine (BASAGLAR KWIKPEN) 100 UNIT/ML Inject 15 Units into the skin at bedtime.   Insulin Pen Needle (PEN NEEDLES) 31G X 6 MM MISC Use to inject insulin once daily   meloxicam (MOBIC) 15 MG tablet Take 1 tablet (15 mg total) by mouth daily as needed for pain.   metFORMIN (GLUCOPHAGE) 500 MG tablet TAKE 2 TABLETS (1,000 MG TOTAL) BY MOUTH 2 TIMES DAILY WITH A MEAL.   Polyethyl Glycol-Propyl Glycol (SYSTANE OP) Apply to eye.   Propylene Glycol (SYSTANE BALANCE) 0.6 % SOLN Place 1 drop into both eyes daily as needed (dry eyes).   MAGNESIUM PO Take 260 mg by mouth daily. (Patient not taking: Reported on 07/13/2023)   OVER THE COUNTER MEDICATION Garlic and fish oil (Patient not taking: Reported on 07/13/2023)   OVER THE COUNTER MEDICATION Calcium 600 mg viatmin d (Patient not taking: Reported on 07/13/2023)   No facility-administered encounter medications on file as of 11/15/2023.    ALLERGIES: Allergies  Allergen Reactions   Ozempic (0.25 Or 0.5 Mg-Dose) [Semaglutide(0.25 Or 0.5mg -Dos)] Nausea And Vomiting   Septra [Sulfamethoxazole-Trimethoprim] Swelling and Rash    VACCINATION STATUS: Immunization History  Administered Date(s) Administered   Fluad Quad(high Dose 65+) 10/09/2020, 10/07/2021, 08/25/2022   Fluad Trivalent(High Dose 65+) 11/01/2023   Influenza, High Dose Seasonal PF 10/23/2019   Influenza,inj,Quad PF,6+ Mos 08/23/2014, 11/18/2015, 11/01/2017, 10/18/2018   Influenza-Unspecified 10/01/2016, 10/18/2018   Moderna SARS-COV2 Booster Vaccination 10/30/2020, 06/10/2021   Moderna Sars-Covid-2 Vaccination 03/24/2020, 04/22/2020   PNEUMOCOCCAL CONJUGATE-20 11/24/2022   Td 06/29/2016    Diabetes She presents for her follow-up diabetic visit. She has type 2 diabetes mellitus. Onset time: Diagnosed  at approx age of 67 (also had Gest DM) Her disease course has been improving. There are no hypoglycemic associated symptoms. There are no diabetic associated symptoms. There are no hypoglycemic complications. There are no diabetic complications. Risk factors for coronary artery disease include diabetes mellitus and family history. Current diabetic treatment includes insulin injections and oral agent (monotherapy). She is compliant with treatment most of the time. Her weight is fluctuating minimally. She is following a generally healthy diet. She has had a previous visit with a dietitian. She participates in exercise daily. Her home blood glucose trend  is decreasing steadily. Her breakfast blood glucose range is generally 70-90 mg/dl. Her bedtime blood glucose range is generally 130-140 mg/dl. (She presents today with her logs showing significantly improved glycemic profile with at target fasting and postprandial readings. Her most recent A1c on 11/18 was 8.4%, increasing from last visit of 7.8%.  She denies any hypoglycemia.  She continues to work on her diet.) An ACE inhibitor/angiotensin II receptor blocker is being taken. She does not see a podiatrist.Eye exam is current.   Review of systems  Constitutional: + Minimally fluctuating body weight,  current Body mass index is 25.18 kg/m. , no fatigue, no subjective hyperthermia, no subjective hypothermia Eyes: no blurry vision, no xerophthalmia ENT: no sore throat, no nodules palpated in throat, no dysphagia/odynophagia, no hoarseness Cardiovascular: no chest pain, no shortness of breath, no palpitations, no leg swelling Respiratory: no cough, no shortness of breath Gastrointestinal: no nausea/vomiting/diarrhea Musculoskeletal: no muscle/joint aches Skin: no rashes, no hyperemia Neurological: no tremors, no numbness, no tingling, no dizziness Psychiatric: no depression, no anxiety  Objective:     BP (!) 142/90 (BP Location: Right Arm, Patient  Position: Sitting, Cuff Size: Large) Comment: Retake with manuel cuff. Patient states that she has taken her BP meds this morning.Patient follows Dr. Adriana Simas for her HTN. Jj Enyeart made aware.  Pulse 74   Ht 5' 6.5" (1.689 m)   Wt 158 lb 6.4 oz (71.8 kg)   BMI 25.18 kg/m   Wt Readings from Last 3 Encounters:  11/15/23 158 lb 6.4 oz (71.8 kg)  11/01/23 159 lb (72.1 kg)  07/13/23 159 lb (72.1 kg)     BP Readings from Last 3 Encounters:  11/15/23 (!) 142/90  11/01/23 (!) 180/80  09/05/23 (!) 161/83       Physical Exam- Limited  Constitutional:  Body mass index is 25.18 kg/m. , not in acute distress, normal state of mind Eyes:  EOMI, no exophthalmos Musculoskeletal: no gross deformities, strength intact in all four extremities, no gross restriction of joint movements Skin:  no rashes, no hyperemia Neurological: no tremor with outstretched hands   Diabetic Foot Exam - Simple   No data filed     CMP ( most recent) CMP     Component Value Date/Time   NA 146 (H) 11/01/2023 1110   K 4.2 11/01/2023 1110   CL 106 11/01/2023 1110   CO2 23 11/01/2023 1110   GLUCOSE 94 11/01/2023 1110   GLUCOSE 128 (H) 01/08/2014 1209   BUN 10 11/01/2023 1110   CREATININE 0.92 11/01/2023 1110   CREATININE 0.91 01/08/2014 1209   CALCIUM 9.9 11/01/2023 1110   PROT 7.6 11/01/2023 1110   ALBUMIN 4.7 11/01/2023 1110   AST 24 11/01/2023 1110   ALT 16 11/01/2023 1110   ALKPHOS 73 11/01/2023 1110   BILITOT 0.4 11/01/2023 1110   GFRNONAA 69 10/18/2020 0821   GFRAA 79 10/18/2020 0821     Diabetic Labs (most recent): Lab Results  Component Value Date   HGBA1C 8.4 (H) 11/01/2023   HGBA1C 7.8 (A) 07/13/2023   HGBA1C 10.6 (H) 03/05/2023   MICROALBUR 30 mg/L 10/05/2022   MICROALBUR 1.22 01/08/2014     Lipid Panel ( most recent) Lipid Panel     Component Value Date/Time   CHOL 172 11/01/2023 1110   TRIG 53 11/01/2023 1110   HDL 93 11/01/2023 1110   CHOLHDL 1.8 11/01/2023 1110   CHOLHDL 2.8  01/08/2014 1209   VLDL 17 01/08/2014 1209   LDLCALC 68 11/01/2023 1110  LABVLDL 11 11/01/2023 1110      No results found for: "TSH", "FREET4"         Assessment & Plan:   1) Type 2 diabetes mellitus with stage 2 chronic kidney disease, without long-term current use of insulin (HCC)  She presents today with her logs showing significantly improved glycemic profile with at target fasting and postprandial readings. Her most recent A1c on 11/18 was 8.4%, increasing from last visit of 7.8%.  She denies any hypoglycemia.  She continues to work on her diet.  - Mary Vazquez has currently uncontrolled symptomatic type 2 DM since 67 years of age.   -Recent labs reviewed.  - I had a long discussion with her about the progressive nature of diabetes and the pathology behind its complications. -her diabetes is not currently complicated but she remains at a high risk for more acute and chronic complications which include CAD, CVA, CKD, retinopathy, and neuropathy. These are all discussed in detail with her.  The following Lifestyle Medicine recommendations according to American College of Lifestyle Medicine [facility]) were discussed and offered to patient and she agrees to start the journey:  A. Whole Foods, Plant-based plate comprising of fruits and vegetables, plant-based proteins, whole-grain carbohydrates was discussed in detail with the patient.   A list for source of those nutrients were also provided to the patient.  Patient will use only water or unsweetened tea for hydration. B.  The need to stay away from risky substances including alcohol, smoking; obtaining 7 to 9 hours of restorative sleep, at least 150 minutes of moderate intensity exercise weekly, the importance of healthy social connections,  and stress reduction techniques were discussed. C.  A full color page of  Calorie density of various food groups per pound showing examples of each food groups was provided to the patient.  -  Nutritional counseling repeated at each appointment due to patients tendency to fall back in to old habits.  - The patient admits there is a room for improvement in their diet and drink choices. -  Suggestion is made for the patient to avoid simple carbohydrates from their diet including Cakes, Sweet Desserts / Pastries, Ice Cream, Soda (diet and regular), Sweet Tea, Candies, Chips, Cookies, Sweet Pastries, Store Bought Juices, Alcohol in Excess of 1-2 drinks a day, Artificial Sweeteners, Coffee Creamer, and "Sugar-free" Products. This will help patient to have stable blood glucose profile and potentially avoid unintended weight gain.   - I encouraged the patient to switch to unprocessed or minimally processed complex starch and increased protein intake (animal or plant source), fruits, and vegetables.   - Patient is advised to stick to a routine mealtimes to eat 3 meals a day and avoid unnecessary snacks (to snack only to correct hypoglycemia).  - she has already been set up with Norm Salt, RDN, CDE for diabetes education.  - I have approached her with the following individualized plan to manage her diabetes and patient agrees:   -She is advised to continue Metformin 1000 mg po twice daily with meals and Basaglar 15 units SQ nightly.   -she is encouraged to continue monitoring glucose twice daily, before breakfast and before bed, and to call the clinic if she has readings less than 70 or above 300 for 3 tests in a row.  - Adjustment parameters are given to her for hypo and hyperglycemia in writing.  - her [place] Pepper will be discontinued, risk outweighs benefit for this patient.  She notes significant vaginal yeast  with this medication, requiring prescriptions to alleviate.  - she is not an ideal candidate for incretin therapy due to body habitus and BMI less than 25.  She also notes allergy to Ozempic in the past.  - Specific targets for  A1c; LDL, HDL, and Triglycerides were discussed  with the patient.  2) Blood Pressure /Hypertension:  her blood pressure is at target.   she is advised to continue her current medications including Enalapril 20 mg p.o. daily with breakfast, Norvasc 5 mg po daily.  3) Lipids/Hyperlipidemia:    Review of her recent lipid panel from 03/05/23 showed controlled LDL at 48 . She stopped taking her Lipitor as her refills were out.  She wants to see if she actually needs it before restarting.   4)  Weight/Diet:  her Body mass index is 25.18 kg/m.  -   she is NOT a candidate for weight loss.  Exercise, and detailed carbohydrates information provided  -  detailed on discharge instructions.  5) Chronic Care/Health Maintenance: -she is on ACEI/ARB and Statin medications and is encouraged to initiate and continue to follow up with Ophthalmology, Dentist, Podiatrist at least yearly or according to recommendations, and advised to stay away from smoking. I have recommended yearly flu vaccine and pneumonia vaccine at least every 5 years; moderate intensity exercise for up to 150 minutes weekly; and sleep for at least 7 hours a day.  - she is advised to maintain close follow up with Tommie Sams, DO for primary care needs, as well as her other providers for optimal and coordinated care.  6) Fatigue, Hair loss, Brittle nails Will check comprehensive thyroid panel, vitamin D, B12 and folate to investigate her recent symptoms.   I spent  26  minutes in the care of the patient today including review of labs from CMP, Lipids, Thyroid Function, Hematology (current and previous including abstractions from other facilities); face-to-face time discussing  her blood glucose readings/logs, discussing hypoglycemia and hyperglycemia episodes and symptoms, medications doses, her options of short and long term treatment based on the latest standards of care / guidelines;  discussion about incorporating lifestyle medicine;  and documenting the encounter. Risk reduction  counseling performed per USPSTF guidelines to reduce obesity and cardiovascular risk factors.     Please refer to Patient Instructions for Blood Glucose Monitoring and Insulin/Medications Dosing Guide"  in media tab for additional information. Please  also refer to " Patient Self Inventory" in the Media  tab for reviewed elements of pertinent patient history.  Mary Vazquez participated in the discussions, expressed understanding, and voiced agreement with the above plans.  All questions were answered to her satisfaction. she is encouraged to contact clinic should she have any questions or concerns prior to her return visit.     Follow up plan: - Return in about 3 months (around 02/13/2024) for Diabetes F/U with A1c in office, Previsit labs, Bring meter and logs.   Ronny Bacon, [facility] [address] [place], [place] [address] Phone: [phone] Fax: [phone]  11/15/2023, 11:31 AM

## 2023-11-16 ENCOUNTER — Encounter: Payer: Self-pay | Admitting: Family Medicine

## 2023-11-16 ENCOUNTER — Ambulatory Visit: Payer: Medicare Other | Admitting: Family Medicine

## 2023-11-16 VITALS — BP 138/75 | HR 83 | Temp 97.0°F | Ht 66.5 in | Wt 161.0 lb

## 2023-11-16 DIAGNOSIS — R0609 Other forms of dyspnea: Secondary | ICD-10-CM | POA: Diagnosis not present

## 2023-11-16 DIAGNOSIS — I10 Essential (primary) hypertension: Secondary | ICD-10-CM

## 2023-11-16 NOTE — Progress Notes (Signed)
Subjective:  Patient ID: Mary Vazquez, female    DOB: 03-05-1956  Age: 67 y.o. MRN: [id number]  CC:   Chief Complaint  Patient presents with   DOE 2 week follow up     HPI:  67 year old female with hypertension, GERD, diabetes, hyperlipidemia presents for follow-up regarding dyspnea on exertion.  Workup thus far has been negative.  Chest x-ray clear.  BNP normal.  Labs with no evidence of renal disease or anemia.  Cardiology referral has been placed and she has an appointment in January.  Patient states that she is feeling okay.  No change in her symptoms.  She is still active.  No chest pain.  Patient Active Problem List   Diagnosis Date Noted   DOE (dyspnea on exertion) 11/01/2023   Right shoulder pain 04/05/2023   Lipoma 08/26/2022   Hand pain 05/19/2022   Hyperlipidemia 11/18/2021   Hypertension 03/02/2013   Diabetes (HCC) 03/02/2013   GERD (gastroesophageal reflux disease) 03/02/2013    Social Hx   Social History   Socioeconomic History   Marital status: Widowed    Spouse name: Not on file   Number of children: 3   Years of education: Not on file   Highest education level: 12th grade  Occupational History   Not on file  Tobacco Use   Smoking status: Never   Smokeless tobacco: Never  Vaping Use   Vaping status: Never Used  Substance and Sexual Activity   Alcohol use: No   Drug use: No   Sexual activity: Not Currently    Birth control/protection: Post-menopausal, Surgical    Comment: tubal  Other Topics Concern   Not on file  Social History Narrative   Husband passed 01/2020.   3 children    2 grandchildren   Social Determinants of Health   Financial Resource Strain: Low Risk  (11/01/2023)   Overall Financial Resource Strain (CARDIA)    Difficulty of Paying Living Expenses: Not very hard  Food Insecurity: No Food Insecurity (11/01/2023)   Hunger Vital Sign    Worried About Running Out of Food in the Last Year: Never true    Ran Out of Food  in the Last Year: Never true  Transportation Needs: No Transportation Needs (11/01/2023)   PRAPARE - Administrator, Civil Service (Medical): No    Lack of Transportation (Non-Medical): No  Physical Activity: Sufficiently Active (11/01/2023)   Exercise Vital Sign    Days of Exercise per Week: 5 days    Minutes of Exercise per Session: 70 min  Stress: No Stress Concern Present (11/01/2023)   [employer] of Occupational Health - Occupational Stress Questionnaire    Feeling of Stress : Only a little  Social Connections: Moderately Integrated (11/01/2023)   Social Connection and Isolation Panel [NHANES]    Frequency of Communication with Friends and Family: Twice a week    Frequency of Social Gatherings with Friends and Family: More than three times a week    Attends Religious Services: More than 4 times per year    Active Member of [employer] or Organizations: Yes    Attends Banker Meetings: More than 4 times per year    Marital Status: Widowed    Review of Systems Per HPI  Objective:  BP 138/75   Pulse 83   Temp (!) 97 F (36.1 C)   Ht 5' 6.5" (1.689 m)   Wt 161 lb (73 kg)   SpO2 97%   BMI 25.60  kg/m      11/16/2023   10:58 AM 11/15/2023   11:18 AM 11/15/2023   11:12 AM  BP/Weight  Systolic BP 138 142 157  Diastolic BP 75 90 83  Wt. (Lbs) 161  158.4  BMI 25.6 kg/m2  25.18 kg/m2    Physical Exam Vitals and nursing note reviewed.  Constitutional:      General: She is not in acute distress.    Appearance: Normal appearance.  HENT:     Head: Normocephalic and atraumatic.  Eyes:     General:        Right eye: No discharge.  Cardiovascular:     Rate and Rhythm: Normal rate and regular rhythm.  Pulmonary:     Effort: Pulmonary effort is normal.     Breath sounds: Normal breath sounds. No wheezing, rhonchi or rales.  Neurological:     Mental Status: She is alert.     Lab Results  Component Value Date   WBC 4.3 11/01/2023   HGB 14.8  11/01/2023   HCT 47.2 (H) 11/01/2023   PLT 235 11/01/2023   GLUCOSE 94 11/01/2023   CHOL 172 11/01/2023   TRIG 53 11/01/2023   HDL 93 11/01/2023   LDLCALC 68 11/01/2023   ALT 16 11/01/2023   AST 24 11/01/2023   NA 146 (H) 11/01/2023   K 4.2 11/01/2023   CL 106 11/01/2023   CREATININE 0.92 11/01/2023   BUN 10 11/01/2023   CO2 23 11/01/2023   HGBA1C 8.4 (H) 11/01/2023   MICROALBUR 30 mg/L 10/05/2022     Assessment & Plan:   Problem List Items Addressed This Visit       Cardiovascular and Mediastinum   Hypertension    BP stable.  Continue amlodipine and enalapril.        Other   DOE (dyspnea on exertion) - Primary    Stable.  Advised rest.  Awaiting cardiac workup.  Recommend echo and stress test versus CT coronary.        Follow-up:  Return in about 3 months (around 02/14/2024).  Everlene Other DO [facility]

## 2023-11-16 NOTE — Assessment & Plan Note (Signed)
BP stable.  Continue amlodipine and enalapril.

## 2023-11-16 NOTE — Assessment & Plan Note (Signed)
Stable.  Advised rest.  Awaiting cardiac workup.  Recommend echo and stress test versus CT coronary.

## 2023-11-16 NOTE — Patient Instructions (Signed)
Take it easy.  Merry Christmas.  Follow up in 3 months.

## 2023-11-17 LAB — THYROID PEROXIDASE ANTIBODY: Thyroperoxidase Ab SerPl-aCnc: 13 [IU]/mL (ref 0–34)

## 2023-11-17 LAB — B12 AND FOLATE PANEL
Folate: 6.5 ng/mL (ref >3.0)
Vitamin B-12: 280 pg/mL (ref 232–1245)

## 2023-11-17 LAB — T3, FREE: T3, Free: 2.5 pg/mL (ref 2.0–4.4)

## 2023-11-17 LAB — THYROGLOBULIN ANTIBODY: Thyroglobulin Antibody: <1 [IU]/mL (ref 0.0–0.9)

## 2023-11-17 LAB — TSH: TSH: 1.36 u[IU]/mL (ref 0.450–4.500)

## 2023-11-17 LAB — VITAMIN D 25 HYDROXY (VIT D DEFICIENCY, FRACTURES): Vit D, 25-Hydroxy: 32 ng/mL (ref 30.0–100.0)

## 2023-11-17 LAB — T4, FREE: Free T4: 0.94 ng/dL (ref 0.82–1.77)

## 2023-11-18 ENCOUNTER — Encounter: Payer: Self-pay | Admitting: Nurse Practitioner

## 2023-11-18 ENCOUNTER — Telehealth: Payer: Self-pay | Admitting: *Deleted

## 2023-11-18 NOTE — Telephone Encounter (Signed)
Noted that Alphonzo Lemmings has sent out ta MyChart message to the patient, going over recent lab results.

## 2023-11-18 NOTE — Telephone Encounter (Signed)
-----   Message from Dani Gobble sent at 11/18/2023  7:45 AM EST ----- FYI: I sent mychart message going over recent labs.

## 2023-11-18 NOTE — Progress Notes (Signed)
FYI: I sent mychart message going over recent labs.

## 2023-11-22 ENCOUNTER — Other Ambulatory Visit (HOSPITAL_COMMUNITY)
Admission: RE | Admit: 2023-11-22 | Discharge: 2023-11-22 | Disposition: A | Discharge status: Home / Self Care | Payer: Medicare Other | Source: Ambulatory Visit | Attending: Adult Health | Admitting: Adult Health

## 2023-11-22 ENCOUNTER — Encounter: Payer: Self-pay | Admitting: Adult Health

## 2023-11-22 ENCOUNTER — Ambulatory Visit (INDEPENDENT_AMBULATORY_CARE_PROVIDER_SITE_OTHER): Payer: Medicare Other | Admitting: Adult Health

## 2023-11-22 VITALS — BP 129/82 | HR 67 | Ht 67.0 in | Wt 157.0 lb

## 2023-11-22 DIAGNOSIS — Z01419 Encounter for gynecological examination (general) (routine) without abnormal findings: Secondary | ICD-10-CM | POA: Insufficient documentation

## 2023-11-22 DIAGNOSIS — Z1211 Encounter for screening for malignant neoplasm of colon: Secondary | ICD-10-CM | POA: Insufficient documentation

## 2023-11-22 DIAGNOSIS — Z1151 Encounter for screening for human papillomavirus (HPV): Secondary | ICD-10-CM | POA: Diagnosis not present

## 2023-11-22 LAB — HEMOCCULT GUIAC POC 1CARD (OFFICE): Fecal Occult Blood, POC: NEGATIVE

## 2023-11-22 NOTE — Progress Notes (Signed)
Patient ID: Mary Vazquez, female   DOB: Mar 15, 1956, 67 y.o.   MRN: [id number] History of Present Illness: Mary Vazquez is a 67 year old black female,widowed, PM in for a well woman gyn exam and pap. She is active, walks 5 days a week and takes care of her house and yard.  PCP is Dr Adriana Simas.    Current Medications, Allergies, Past Medical History, Past Surgical History, Family History and Social History were reviewed in [employer] record.     Review of Systems: Patient denies any headaches, hearing loss, fatigue, blurred vision, shortness of breath, chest pain, abdominal pain, problems with bowel movements,(has mild constipation, increases water) urination, or intercourse(not active). No joint pain or mood swings.  Has heaviness in chest at times, sees cardiologist in January   Physical Exam:BP 129/82 (BP Location: Left Arm, Patient Position: Sitting, Cuff Size: Normal)   Pulse 67   Ht 5\' 7"  (1.702 m)   Wt 157 lb (71.2 kg)   BMI 24.59 kg/m   General:  Well developed, well nourished, no acute distress Skin:  Warm and dry Neck:  Midline trachea, normal thyroid, good ROM, no lymphadenopathy,no carotid bruits heard  Lungs; Clear to auscultation bilaterally Breast:  No dominant palpable mass, retraction, or nipple discharge Cardiovascular: Regular rate and rhythm Abdomen:  Soft, non tender, no hepatosplenomegaly Pelvic:  External genitalia is normal in appearance, no lesions.  The vagina is pale. Urethra has no lesions or masses. The cervix is smooth, pap with HR HPV genotyping was performed.  Uterus is felt to be normal size, shape, and contour.  No adnexal masses or tenderness noted.Bladder is non tender, no masses felt. Rectal: Good sphincter tone, no polyps, or hemorrhoids felt.  Hemoccult negative. Extremities/musculoskeletal:  No swelling or varicosities noted, no clubbing or cyanosis, has glove on right hand for arthritis  Psych:  No mood changes, alert and  cooperative,seems happy AA is 1 Fall risk is low    11/22/2023   11:35 AM 11/01/2023   10:03 AM 04/28/2023   11:09 AM  Depression screen PHQ 2/9  Decreased Interest 0 0 0  Down, Depressed, Hopeless 0 0 0  PHQ - 2 Score 0 0 0  Altered sleeping 0 0 1  Tired, decreased energy 0 0 0  Change in appetite 0 0 0  Feeling bad or failure about yourself  0 0 0  Trouble concentrating 0 0 0  Moving slowly or fidgety/restless 0 0 0  Suicidal thoughts 0 0 0  PHQ-9 Score 0 0 1  Difficult doing work/chores  Not difficult at all Not difficult at all       11/22/2023   11:35 AM 11/01/2023   10:03 AM 04/28/2023   11:09 AM 10/02/2020    4:05 PM  GAD 7 : Generalized Anxiety Score  Nervous, Anxious, on Edge 0 0 0 1  Control/stop worrying 0 0 0 0  Worry too much - different things 0 0 0 0  Trouble relaxing 0 0 0 1  Restless 0 0 0 0  Easily annoyed or irritable 0 0 0 0  Afraid - awful might happen 0 0 0 0  Total GAD 7 Score 0 0 0 2  Anxiety Difficulty  Not difficult at all Not difficult at all       Upstream - 11/22/23 1142       Pregnancy Intention Screening   Does the patient want to become pregnant in the next year? N/A    Would  the patient like to discuss contraceptive options today? N/A      Contraception Wrap Up   Current Method Female Sterilization   tubal, postmenopausal   End Method Female Sterilization    Contraception Counseling Provided No            Examination chaperoned by Freddie Apley RN  Impression and plan:  1. Encounter for gynecological examination with Papanicolaou smear of cervix Pap sent If normal this will be her last pap  Physical and labs with PCP in 1 year Mammogram was negative 08/03/23 Colonoscopy per GI Stay active   2. Encounter for screening fecal occult blood testing Hemoccult was negative

## 2023-11-23 LAB — CYTOLOGY - PAP
Comment: NEGATIVE
Diagnosis: NEGATIVE
High risk HPV: NEGATIVE

## 2023-12-02 ENCOUNTER — Encounter: Payer: Self-pay | Admitting: Family Medicine

## 2023-12-03 ENCOUNTER — Other Ambulatory Visit: Payer: Self-pay | Admitting: Family Medicine

## 2023-12-03 MED ORDER — OMEPRAZOLE 40 MG PO CPDR: 40.0000 mg | DELAYED_RELEASE_CAPSULE | Freq: Every day | ORAL | 3 refills | Status: DC

## 2024-01-10 ENCOUNTER — Ambulatory Visit: Payer: Medicare Other | Attending: Cardiology | Admitting: Cardiology

## 2024-01-10 VITALS — BP 118/62 | HR 81 | Ht 67.0 in | Wt 158.0 lb

## 2024-01-10 DIAGNOSIS — R0609 Other forms of dyspnea: Secondary | ICD-10-CM

## 2024-01-10 DIAGNOSIS — E785 Hyperlipidemia, unspecified: Secondary | ICD-10-CM

## 2024-01-10 DIAGNOSIS — E119 Type 2 diabetes mellitus without complications: Secondary | ICD-10-CM | POA: Diagnosis not present

## 2024-01-10 DIAGNOSIS — E1169 Type 2 diabetes mellitus with other specified complication: Secondary | ICD-10-CM

## 2024-01-10 DIAGNOSIS — R079 Chest pain, unspecified: Secondary | ICD-10-CM | POA: Diagnosis not present

## 2024-01-10 DIAGNOSIS — I10 Essential (primary) hypertension: Secondary | ICD-10-CM

## 2024-01-10 DIAGNOSIS — Z794 Long term (current) use of insulin: Secondary | ICD-10-CM

## 2024-01-10 MED ORDER — METOPROLOL TARTRATE 100 MG PO TABS
100.0000 mg | ORAL_TABLET | Freq: Once | ORAL | 0 refills | Status: DC
Start: 2024-01-10 — End: 2024-02-15

## 2024-01-10 NOTE — Patient Instructions (Addendum)
Medication Instructions:    See below one time dose of Metoprolol tartrate   *If you need a refill on your cardiac medications before your next appointment, please call your pharmacy*   Lab Work: BMP   If you have labs (blood work) drawn today and your tests are completely normal, you will receive your results only by: MyChart Message (if you have MyChart) OR A paper copy in the mail If you have any lab test that is abnormal or we need to change your treatment, we will call you to review the results.   Testing/Procedures:   Will be schedule at [facility] [address] Your physician has requested that you have an echocardiogram. Echocardiography is a painless test that uses sound waves to create images of your heart. It provides your doctor with information about the size and shape of your heart and how well your heart's chambers and valves are working. This procedure takes approximately one hour. There are no restrictions for this procedure. Please do NOT wear cologne, perfume, aftershave, or lotions (deodorant is allowed). Please arrive 15 minutes prior to your appointment time.  Please note: We ask at that you not bring children with you during ultrasound (echo/ vascular) testing. Due to room size and safety concerns, children are not allowed in the ultrasound rooms during exams. Our front office staff cannot provide observation of children in our lobby area while testing is being conducted. An adult accompanying a patient to their appointment will only be allowed in the ultrasound room at the discretion of the ultrasound technician under special circumstances. We apologize for any inconvenience.     Will be schedule at [facility] - Radiology Dept Your physician has requested that you have coronary  CTA. Coronary computed tomography (CT)angiogram  is a special type of CT scan that uses a computer to produce multi-dimensional views of major blood vessels throughout the heart.   CT angiography, a contrast material is injected through an IV to help visualize the blood vessels  a painless test that uses an x-ray machine to take clear, detailed pictures of your heart arteries .  Please follow instruction sheet as given.   Follow-Up: At [facility], you and your health needs are our priority.  As part of our continuing mission to provide you with exceptional heart care, we have created designated Provider Care Teams.  These Care Teams include your primary Cardiologist (physician) and Advanced Practice Providers (APPs -  Physician Assistants and Nurse Practitioners) who all work together to provide you with the care you need, when you need it.     Your next appointment:   1 to 3  month(s)  The format for your next appointment:   In Person  Provider:   Bryan Lemma, MD    Other Instructions    Your cardiac CT will be scheduled at  below location:   [facility] [address] [place], [place] [address] [phone]    If scheduled at [facility], please arrive at the [facility] and Children's Entrance (Entrance C2) of [facility] 30 minutes prior to test start time. You can use the FREE valet parking offered at entrance C (encouraged to control the heart rate for the test)  Proceed to the [facility] Radiology Department (first floor) to check-in and test prep.  All radiology patients and guests should use entrance C2 at [facility], accessed from [facility], even though the hospital's physical address listed is  [address].        Please follow these instructions carefully (unless otherwise directed):  An IV will be required for this test and Nitroglycerin will be given.    BMP  today   On the Night Before the Test: Be sure to Drink plenty of water. Do not consume any caffeinated/decaffeinated beverages or chocolate 12 hours prior to your test. Do not take any antihistamines 12 hours  prior to your test.   On the Day of the Test: Drink plenty of water until 1 hour prior to the test. Do not eat any food 1 hour prior to test. You may take your regular medications prior to the test.  Take metoprolol 100 mg (Lopressor) two hours prior to test. FEMALES- please wear underwire-free bra if available, avoid dresses & tight clothing   After the Test: Drink plenty of water. After receiving IV contrast, you may experience a mild flushed feeling. This is normal. On occasion, you may experience a mild rash up to 24 hours after the test. This is not dangerous. If this occurs, you can take Benadryl 25 mg and increase your fluid intake. If you experience trouble breathing, this can be serious. If it is severe call 911 IMMEDIATELY. If it is mild, please call our office.  We will call to schedule your test 2-4 weeks out understanding that some insurance companies will need an authorization prior to the service being performed.   For more information and frequently asked questions, please visit our website : [email or web address]  For non-scheduling related questions, please contact the cardiac imaging nurse navigator should you have any questions/concerns: Cardiac Imaging Nurse Navigators Direct Office Dial: [phone]   For scheduling needs, including cancellations and rescheduling, please call [place], [phone].

## 2024-01-10 NOTE — Progress Notes (Unsigned)
Cardiology Office Note:  .   Date:  01/11/2024  ID:  Mary Vazquez, DOB 09-Dec-1956, MRN [id number] PCP: Tommie Sams, DO  [facility] Providers Cardiologist:  Bryan Lemma, MD     Chief Complaint  Patient presents with   New Patient (Initial Visit)    Evaluation of exertional shortness of breath and chest discomfort    Patient Profile: .     Mary Vazquez is a 68 y.o. female never-smoker with a PMH notable for HTN, DM-2 (recently started on insulin) & HLD who presents here for evaluation of DYSPNEA AND CHEST TIGHTNESS ON EXERTION at the request of Tommie Sams, DO.   Mary Vazquez was seen by Dr. Adriana Simas on November 01, 2023 with complaint of exertional dyspnea over the last several months.  Subjective  Discussed the use of AI scribe software for clinical note transcription with the patient, who gave verbal consent to proceed.  History of Present Illness   Miss Wander, a patient with a history of hypertension, type 2 diabetes (now on insulin), hyperlipidemia, and GERD, was referred by Dr. Adriana Simas following a consultation on December 3rd.    She notes that she is usually very active walking on a trail 5 times a week, but of late was noticing progressive exertional dyspnea mostly walking uphill up steps.  Occasionally noticed chest heaviness and tightness associate with his dyspnea.  She was referred to Cardiology. Her ese symptoms were initially attributed to GERD, which shares similar manifestations, and she was started on a PPI.  However, despite treatment for GERD, the patient continued to experience some symptoms, including frequent burping and occasional regurgitation when bending over. She also notes that the severity of the chest heaviness and shortness of breath during uphill walks has reportedly decreased but certainly not completely gone.   The patient has been managing her diabetes with insulin since March, following a period of illness that led to  elevated blood sugar levels. She is also on amlodipine and enalapril for hypertension, and Lipitor for hyperlipidemia. The patient reported no known family history of heart disease, but an aunt on her father's side had diabetes.   The patient denied experiencing any irregular heartbeats, shortness of breath or chest tightness at rest, shortness of breath when lying flat, or waking up short of breath in the middle of the night. She also reported no swelling in her legs.  The patient has never smoked and has not had any major operations. She first experienced high blood pressure following a tubal ligation procedure, and developed diabetes 20 years later due to stress. The patient's first bout of chest pain was attributed to grief. Despite some improvement in symptoms with the treatment of GERD and the addition of amlodipine, the patient continues to experience some chest heaviness and shortness of breath, particularly when walking uphill.     Cardiovascular ROS: positive for - chest pain, dyspnea on exertion, and heart pounding symptoms negative for - edema, irregular heartbeat, orthopnea, palpitations, paroxysmal nocturnal dyspnea, rapid heart rate, shortness of breath, or lightheadedness, dizziness or wooziness, syncope or near syncope, TIA or emesis BX, claudication; melena, hematochezia, hematuria or epistaxis  ROS:  Review of Systems - Negative except symptoms noted in HPI.    Objective    Studies Reviewed: Marland Kitchen        Lab Results  Component Value Date   CHOL 172 11/01/2023   HDL 93 11/01/2023   LDLCALC 68 11/01/2023   TRIG 53 11/01/2023  CHOLHDL 1.8 11/01/2023   Lab Results  Component Value Date   NA 140 01/10/2024   K 5.1 01/10/2024   CREATININE 0.92 01/10/2024   EGFR 67 01/10/2024   GLUCOSE 155 (H) 01/10/2024   Lab Results  Component Value Date   HGBA1C 8.4 (H) 11/01/2023   Risk Assessment/Calculations:         Physical Exam:   VS:  BP 118/62 (BP Location: Right Arm,  Patient Position: Sitting, Cuff Size: Normal)   Pulse 81   Ht 5\' 7"  (1.702 m)   Wt 158 lb (71.7 kg)   SpO2 99%   BMI 24.75 kg/m    Wt Readings from Last 3 Encounters:  01/10/24 158 lb (71.7 kg)  11/22/23 157 lb (71.2 kg)  11/16/23 161 lb (73 kg)    GEN: Well nourished, well groomed;  in no acute distress; Healthy appearing NECK: No JVD; No carotid bruits CARDIAC: Normal S1, S2; RRR, no murmurs, rubs, gallops RESPIRATORY:  Clear to auscultation without rales, wheezing or rhonchi ; nonlabored, good air movement. ABDOMEN: Soft, non-tender, non-distended EXTREMITIES:  No edema; No deformity     ASSESSMENT AND PLAN: .    Problem List Items Addressed This Visit       Cardiology Problems   Hyperlipidemia associated with type 2 diabetes mellitus (HCC) (Chronic)   Hyperlipidemia On Lipitor 20mg . LDL was 68 in November. -Continue current medication.=> Adjust goals based on upcoming tests.  Most recent A1C was 8.4 which increases likelihood of having significant CAD along with whether hypertension and HLD. Will determine best course of action as far as treatments after discussion with PCP. Recently started on insulin (Basaglar) in addition to metformin due to elevated A1C of 8.4. -Continue current medications.      Relevant Orders   Basic metabolic panel (Completed)   ECHOCARDIOGRAM COMPLETE   CT CORONARY MORPH W/CTA COR W/SCORE W/CA W/CM &/OR WO/CM   Primary hypertension (Chronic)   On amlodipine 5mg  and enalapril 20mg  twice daily. Recent addition of amlodipine may have improved cardiac symptoms. Her blood pressure on initial arrival was 144/78, but after appropriate rest was down to 118/62. - --Personally reviewed EKG from previous visit in chart. -Order echocardiogram to assess cardiac systolic & diastolic function.  -Order coronary CT angiogram to evaluate coronary artery anatomy and possible blockages.      Relevant Orders   Cardiac Stress Test: Informed Consent Details:  Physician/Practitioner Attestation; Transcribe to consent form and obtain patient signature   Basic metabolic panel (Completed)   ECHOCARDIOGRAM COMPLETE   CT CORONARY MORPH W/CTA COR W/SCORE W/CA W/CM &/OR WO/CM     Other   Chest pain on exertion - Primary   Concerning for possible anginal symptoms associated with exertional dyspnea. Symptoms improved with GERD treatment and addition of amlodipine, but not completely resolved. EKG shows sinus bradycardia and possible LVH, likely secondary to hypertension.   Concerning EKG with LVH in the setting of hypertension: Start off with 2D echocardiogram structural evaluation and Coronary CTA for ischemic evaluation  GERD Symptoms improved with recent medication addition. -Continue current medication.      Relevant Orders   Cardiac Stress Test: Informed Consent Details: Physician/Practitioner Attestation; Transcribe to consent form and obtain patient signature   Basic metabolic panel (Completed)   ECHOCARDIOGRAM COMPLETE   CT CORONARY MORPH W/CTA COR W/SCORE W/CA W/CM &/OR WO/CM   DOE (dyspnea on exertion)   Symptoms improved with GERD treatment and addition of amlodipine, but not completely resolved. EKG shows sinus  bradycardia and possible LVH, likely secondary to hypertension. No history of smoking or family history of heart disease. The patient's EKG from November 18th showed sinus bradycardia with a rate of 58 beats per minute and possible left ventricular hypertrophy (LVH), suggesting that the heart may be thickened and stiff. This could be a result of hypertension and may contribute to the patient's shortness of breath. Her blood pressure on initial arrival was 144/78, but after appropriate rest was down to 118/62. - --Personally reviewed EKG from previous visit in chart. -Order echocardiogram to assess cardiac systolic & diastolic function.  -Order coronary CT angiogram to evaluate coronary artery anatomy and possible blockages.       Relevant Orders   Cardiac Stress Test: Informed Consent Details: Physician/Practitioner Attestation; Transcribe to consent form and obtain patient signature   Basic metabolic panel (Completed)   ECHOCARDIOGRAM COMPLETE   CT CORONARY MORPH W/CTA COR W/SCORE W/CA W/CM &/OR WO/CM   Type 2 diabetes mellitus without complication, with long-term current use of insulin (HCC) (Chronic)   Relevant Orders   Basic metabolic panel (Completed)   ECHOCARDIOGRAM COMPLETE   CT CORONARY MORPH W/CTA COR W/SCORE W/CA W/CM &/OR WO/CM         Informed Consent  - if needed Shared Decision Making/Informed Consent The risks [chest pain, shortness of breath, cardiac arrhythmias, dizziness, blood pressure fluctuations, myocardial infarction, stroke/transient ischemic attack, nausea, vomiting, allergic reaction, radiation exposure, metallic taste sensation and life-threatening complications (estimated to be 1 in 10,000)], benefits (risk stratification, diagnosing coronary artery disease, treatment guidance) and alternatives of a nuclear stress test were discussed in detail with Ms. Linthicum and she agrees to proceed.      Follow-Up: Return in about 3 months (around 04/09/2024) for Routine Follow-up after testing ~ 2-3 months.Once test results are back, patient will be contacted within 48 hours. If tests are abnormal, patient will be seen quickly. If unable to see the doctor, patient will see one of the PAs.       Signed, Marykay Lex, MD, MS Bryan Lemma, M.D., M.S. Interventional Cardiologist  [facility]  Pager # [phone] Phone # [phone] [address] [place], [place] [address]

## 2024-01-11 ENCOUNTER — Encounter: Payer: Self-pay | Admitting: Cardiology

## 2024-01-11 LAB — BASIC METABOLIC PANEL
BUN/Creatinine Ratio: 15 (ref 12–28)
BUN: 14 mg/dL (ref 8–27)
CO2: 22 mmol/L (ref 20–29)
Calcium: 9.6 mg/dL (ref 8.7–10.3)
Chloride: 103 mmol/L (ref 96–106)
Creatinine, Ser: 0.92 mg/dL (ref 0.57–1.00)
Glucose: 155 mg/dL — ABNORMAL HIGH (ref 70–99)
Potassium: 5.1 mmol/L (ref 3.5–5.2)
Sodium: 140 mmol/L (ref 134–144)
eGFR: 67 mL/min/{1.73_m2} (ref >59)

## 2024-01-11 NOTE — Assessment & Plan Note (Signed)
On amlodipine 5mg  and enalapril 20mg  twice daily. Recent addition of amlodipine may have improved cardiac symptoms. Her blood pressure on initial arrival was 144/78, but after appropriate rest was down to 118/62. - --Personally reviewed EKG from previous visit in chart. -Order echocardiogram to assess cardiac systolic & diastolic function.  -Order coronary CT angiogram to evaluate coronary artery anatomy and possible blockages.

## 2024-01-11 NOTE — Assessment & Plan Note (Addendum)
Hyperlipidemia On Lipitor 20mg . LDL was 68 in November. -Continue current medication.=> Adjust goals based on upcoming tests.  Most recent A1C was 8.4 which increases likelihood of having significant CAD along with whether hypertension and HLD. Will determine best course of action as far as treatments after discussion with PCP. Recently started on insulin (Basaglar) in addition to metformin due to elevated A1C of 8.4. -Continue current medications.

## 2024-01-11 NOTE — Assessment & Plan Note (Addendum)
Concerning for possible anginal symptoms associated with exertional dyspnea. Symptoms improved with GERD treatment and addition of amlodipine, but not completely resolved. EKG shows sinus bradycardia and possible LVH, likely secondary to hypertension.   Concerning EKG with LVH in the setting of hypertension: Start off with 2D echocardiogram structural evaluation and Coronary CTA for ischemic evaluation  GERD Symptoms improved with recent medication addition. -Continue current medication.

## 2024-01-11 NOTE — Assessment & Plan Note (Signed)
Symptoms improved with GERD treatment and addition of amlodipine, but not completely resolved. EKG shows sinus bradycardia and possible LVH, likely secondary to hypertension. No history of smoking or family history of heart disease. The patient's EKG from November 18th showed sinus bradycardia with a rate of 58 beats per minute and possible left ventricular hypertrophy (LVH), suggesting that the heart may be thickened and stiff. This could be a result of hypertension and may contribute to the patient's shortness of breath. Her blood pressure on initial arrival was 144/78, but after appropriate rest was down to 118/62. - --Personally reviewed EKG from previous visit in chart. -Order echocardiogram to assess cardiac systolic & diastolic function.  -Order coronary CT angiogram to evaluate coronary artery anatomy and possible blockages.

## 2024-01-24 ENCOUNTER — Telehealth (HOSPITAL_COMMUNITY): Payer: Self-pay | Admitting: *Deleted

## 2024-01-24 ENCOUNTER — Ambulatory Visit (HOSPITAL_COMMUNITY)
Admission: RE | Admit: 2024-01-24 | Discharge: 2024-01-24 | Disposition: A | Discharge status: Home / Self Care | Payer: Medicare Other | Source: Ambulatory Visit | Attending: Cardiology | Admitting: Cardiology

## 2024-01-24 DIAGNOSIS — Z794 Long term (current) use of insulin: Secondary | ICD-10-CM | POA: Insufficient documentation

## 2024-01-24 DIAGNOSIS — R0609 Other forms of dyspnea: Secondary | ICD-10-CM | POA: Diagnosis present

## 2024-01-24 DIAGNOSIS — R079 Chest pain, unspecified: Secondary | ICD-10-CM | POA: Insufficient documentation

## 2024-01-24 DIAGNOSIS — E785 Hyperlipidemia, unspecified: Secondary | ICD-10-CM | POA: Diagnosis present

## 2024-01-24 DIAGNOSIS — E119 Type 2 diabetes mellitus without complications: Secondary | ICD-10-CM | POA: Insufficient documentation

## 2024-01-24 DIAGNOSIS — I10 Essential (primary) hypertension: Secondary | ICD-10-CM | POA: Diagnosis present

## 2024-01-24 DIAGNOSIS — E1169 Type 2 diabetes mellitus with other specified complication: Secondary | ICD-10-CM | POA: Diagnosis present

## 2024-01-24 LAB — ECHOCARDIOGRAM COMPLETE
Area-P 1/2: 3.48 cm2
S' Lateral: 2.1 cm

## 2024-01-24 NOTE — Telephone Encounter (Signed)
 Attempted to call patient regarding upcoming cardiac CT appointment. Left message on voicemail with name and callback number  Larey Brick RN Navigator Cardiac Imaging [facility] [phone] Office [phone] Cell

## 2024-01-24 NOTE — Progress Notes (Signed)
*  PRELIMINARY RESULTS* Echocardiogram 2D Echocardiogram has been performed.  Mary Vazquez 01/24/2024, 10:26 AM

## 2024-01-24 NOTE — Telephone Encounter (Signed)
 Reaching out to patient to offer assistance regarding upcoming cardiac imaging study; pt verbalizes understanding of appt date/time, parking situation and where to check in, pre-test NPO status and medications ordered, and verified current allergies; name and call back number provided for further questions should they arise  Chase Copping RN Navigator Cardiac Imaging [facility] [phone] office [phone] cell  Patient to take 100mg  metoprolol  tartrate if HR Is greater than 70 bpm. She is aware to arrive at 7:30 AM.

## 2024-01-25 ENCOUNTER — Ambulatory Visit (HOSPITAL_COMMUNITY)
Admission: RE | Admit: 2024-01-25 | Discharge: 2024-01-25 | Disposition: A | Discharge status: Home / Self Care | Payer: Medicare Other | Source: Ambulatory Visit | Attending: Cardiology | Admitting: Cardiology

## 2024-01-25 ENCOUNTER — Ambulatory Visit: Payer: Medicare Other | Admitting: Cardiology

## 2024-01-25 ENCOUNTER — Encounter (HOSPITAL_COMMUNITY): Payer: Self-pay

## 2024-01-25 DIAGNOSIS — R0609 Other forms of dyspnea: Secondary | ICD-10-CM | POA: Insufficient documentation

## 2024-01-25 DIAGNOSIS — Z794 Long term (current) use of insulin: Secondary | ICD-10-CM | POA: Insufficient documentation

## 2024-01-25 DIAGNOSIS — I10 Essential (primary) hypertension: Secondary | ICD-10-CM | POA: Diagnosis present

## 2024-01-25 DIAGNOSIS — E785 Hyperlipidemia, unspecified: Secondary | ICD-10-CM | POA: Diagnosis not present

## 2024-01-25 DIAGNOSIS — R079 Chest pain, unspecified: Secondary | ICD-10-CM | POA: Diagnosis not present

## 2024-01-25 DIAGNOSIS — E1169 Type 2 diabetes mellitus with other specified complication: Secondary | ICD-10-CM | POA: Diagnosis not present

## 2024-01-25 DIAGNOSIS — M4185 Other forms of scoliosis, thoracolumbar region: Secondary | ICD-10-CM | POA: Insufficient documentation

## 2024-01-25 DIAGNOSIS — E119 Type 2 diabetes mellitus without complications: Secondary | ICD-10-CM | POA: Insufficient documentation

## 2024-01-25 MED ORDER — IOHEXOL 350 MG/ML SOLN
95.0000 mL | Freq: Once | INTRAVENOUS | Status: AC | PRN
  Administered 2024-01-25: 95 mL via INTRAVENOUS

## 2024-01-25 MED ORDER — NITROGLYCERIN 0.4 MG SL SUBL
SUBLINGUAL_TABLET | SUBLINGUAL | Status: AC
  Filled 2024-01-25: qty 2

## 2024-01-25 MED ORDER — NITROGLYCERIN 0.4 MG SL SUBL
0.8000 mg | SUBLINGUAL_TABLET | Freq: Once | SUBLINGUAL | Status: AC
  Administered 2024-01-25: 0.8 mg via SUBLINGUAL

## 2024-01-29 ENCOUNTER — Encounter: Payer: Self-pay | Admitting: Cardiology

## 2024-02-03 ENCOUNTER — Telehealth: Payer: Self-pay | Admitting: Nurse Practitioner

## 2024-02-03 NOTE — Telephone Encounter (Signed)
 She wont need repeat labs before this appt.

## 2024-02-03 NOTE — Telephone Encounter (Signed)
 Pt has appt 02/18/24, last labs were done in Dec.  Do you need new labs and if so can you put an order in?

## 2024-02-15 ENCOUNTER — Encounter: Payer: Self-pay | Admitting: Family Medicine

## 2024-02-15 ENCOUNTER — Ambulatory Visit: Payer: Medicare Other | Admitting: Family Medicine

## 2024-02-15 VITALS — BP 144/72 | HR 67 | Temp 97.2°F | Ht 67.0 in | Wt 161.0 lb

## 2024-02-15 DIAGNOSIS — E785 Hyperlipidemia, unspecified: Secondary | ICD-10-CM

## 2024-02-15 DIAGNOSIS — I10 Essential (primary) hypertension: Secondary | ICD-10-CM | POA: Diagnosis not present

## 2024-02-15 DIAGNOSIS — Z794 Long term (current) use of insulin: Secondary | ICD-10-CM | POA: Diagnosis not present

## 2024-02-15 DIAGNOSIS — E119 Type 2 diabetes mellitus without complications: Secondary | ICD-10-CM

## 2024-02-15 DIAGNOSIS — E1169 Type 2 diabetes mellitus with other specified complication: Secondary | ICD-10-CM

## 2024-02-15 MED ORDER — AMLODIPINE BESYLATE 5 MG PO TABS: 5.0000 mg | ORAL_TABLET | Freq: Every day | ORAL | 3 refills | Status: AC

## 2024-02-15 MED ORDER — ENALAPRIL MALEATE 20 MG PO TABS
20.0000 mg | ORAL_TABLET | Freq: Two times a day (BID) | ORAL | 3 refills | Status: AC
Start: 2024-02-15 — End: ?

## 2024-02-15 MED ORDER — ATORVASTATIN CALCIUM 20 MG PO TABS: 20.0000 mg | ORAL_TABLET | Freq: Every day | ORAL | 3 refills | Status: AC

## 2024-02-15 NOTE — Assessment & Plan Note (Signed)
At goal on Lipitor. Continue. 

## 2024-02-15 NOTE — Assessment & Plan Note (Signed)
 Continue current medications. Advised to monitor.

## 2024-02-15 NOTE — Patient Instructions (Signed)
 Keep an eye on your BP.  Follow up in 6 months.  No labs today.  Take care  Dr. Adriana Simas

## 2024-02-15 NOTE — Progress Notes (Signed)
 Subjective:  Patient ID: Mary Vazquez, female    DOB: June 08, 1956  Age: 68 y.o. MRN: [id number]  CC:   Chief Complaint  Patient presents with   Diabetes    3 month follow up   Hypertension    HPI:  68 year old female presents for follow up.  Overall she is doing well.   Following with Endo regarding DM-2.  Last A1c 8.4  Recently evaluated by cardiology.  CT coronary with no evidence of CAD. Echo overall look good. SOB improving.  BP mildly elevated here today. Compliant with Norvasc and Enalapril   Lipids at goal on Lipitor. Most recent LDL 68.  Patient Active Problem List   Diagnosis Date Noted   Right shoulder pain 04/05/2023   Lipoma 08/26/2022   Hand pain 05/19/2022   Hyperlipidemia associated with type 2 diabetes mellitus (HCC) 11/18/2021   Primary hypertension 03/02/2013   Type 2 diabetes mellitus without complication, with long-term current use of insulin (HCC) 03/02/2013   GERD (gastroesophageal reflux disease) 03/02/2013    Social Hx   Social History   Socioeconomic History   Marital status: Widowed    Spouse name: Not on file   Number of children: 3   Years of education: Not on file   Highest education level: Some college, no degree  Occupational History   Not on file  Tobacco Use   Smoking status: Never   Smokeless tobacco: Never  Vaping Use   Vaping status: Never Used  Substance and Sexual Activity   Alcohol use: No   Drug use: No   Sexual activity: Not Currently    Birth control/protection: Post-menopausal, Surgical    Comment: tubal  Other Topics Concern   Not on file  Social History Narrative   Husband passed 01/2020.   3 children    2 grandchildren   Social Drivers of Corporate investment banker Strain: Low Risk  (02/11/2024)   Overall Financial Resource Strain (CARDIA)    Difficulty of Paying Living Expenses: Not very hard  Food Insecurity: No Food Insecurity (02/11/2024)   Hunger Vital Sign    Worried About Running Out of  Food in the Last Year: Never true    Ran Out of Food in the Last Year: Never true  Transportation Needs: No Transportation Needs (02/11/2024)   PRAPARE - Transportation    Lack of Transportation (Medical): No    Lack of Transportation (Non-Medical): No  Physical Activity: Sufficiently Active (02/11/2024)   Exercise Vital Sign    Days of Exercise per Week: 5 days    Minutes of Exercise per Session: 60 min  Stress: No Stress Concern Present (02/11/2024)   [employer] of Occupational Health - Occupational Stress Questionnaire    Feeling of Stress : Only a little  Social Connections: Moderately Integrated (02/11/2024)   Social Connection and Isolation Panel [NHANES]    Frequency of Communication with Friends and Family: Three times a week    Frequency of Social Gatherings with Friends and Family: More than three times a week    Attends Religious Services: More than 4 times per year    Active Member of Clubs or Organizations: Yes    Attends Banker Meetings: 1 to 4 times per year    Marital Status: Widowed    Review of Systems  Constitutional: Negative.   Cardiovascular: Negative.    Objective:  BP (!) 144/72   Pulse 67   Temp (!) 97.2 F (36.2 C)  Ht 5\' 7"  (1.702 m)   Wt 161 lb (73 kg)   SpO2 98%   BMI 25.22 kg/m      02/15/2024    9:37 AM 02/15/2024    9:00 AM 01/25/2024    8:44 AM  BP/Weight  Systolic BP 144 161 157  Diastolic BP 72 73 88  Wt. (Lbs)  161   BMI  25.22 kg/m2     Physical Exam Vitals and nursing note reviewed.  Constitutional:      General: She is not in acute distress.    Appearance: Normal appearance.  HENT:     Head: Normocephalic and atraumatic.  Eyes:     General:        Right eye: No discharge.        Left eye: No discharge.     Conjunctiva/sclera: Conjunctivae normal.  Cardiovascular:     Rate and Rhythm: Normal rate and regular rhythm.  Pulmonary:     Effort: Pulmonary effort is normal.     Breath sounds: Normal breath  sounds. No wheezing, rhonchi or rales.  Feet:     Comments: Diabetic foot exam performed today. See Quality metrics section. Neurological:     Mental Status: She is alert.  Psychiatric:        Mood and Affect: Mood normal.        Behavior: Behavior normal.     Lab Results  Component Value Date   WBC 4.3 11/01/2023   HGB 14.8 11/01/2023   HCT 47.2 (H) 11/01/2023   PLT 235 11/01/2023   GLUCOSE 155 (H) 01/10/2024   CHOL 172 11/01/2023   TRIG 53 11/01/2023   HDL 93 11/01/2023   LDLCALC 68 11/01/2023   ALT 16 11/01/2023   AST 24 11/01/2023   NA 140 01/10/2024   K 5.1 01/10/2024   CL 103 01/10/2024   CREATININE 0.92 01/10/2024   BUN 14 01/10/2024   CO2 22 01/10/2024   TSH 1.360 11/16/2023   HGBA1C 8.4 (H) 11/01/2023   MICROALBUR 30 mg/L 10/05/2022     Assessment & Plan:  Primary hypertension Assessment & Plan: Continue current medications. Advised to monitor.   Orders: -     amLODIPine Besylate; Take 1 tablet (5 mg total) by mouth daily.  Dispense: 90 tablet; Refill: 3 -     Enalapril Maleate; Take 1 tablet (20 mg total) by mouth 2 (two) times daily.  Dispense: 180 tablet; Refill: 3  Hyperlipidemia associated with type 2 diabetes mellitus (HCC) Assessment & Plan: At goal on Lipitor. Continue.   Orders: -     Atorvastatin Calcium; Take 1 tablet (20 mg total) by mouth daily.  Dispense: 90 tablet; Refill: 3  Type 2 diabetes mellitus without complication, with long-term current use of insulin (HCC) Assessment & Plan: Uncontrolled. Continue close follow up with Endo.    Follow-up:  6 months  Mary Uplinger Adriana Simas DO [facility]

## 2024-02-15 NOTE — Assessment & Plan Note (Signed)
 Uncontrolled. Continue close follow up with Endo.

## 2024-02-18 ENCOUNTER — Encounter: Payer: Self-pay | Admitting: Nurse Practitioner

## 2024-02-18 ENCOUNTER — Ambulatory Visit: Payer: Medicare Other | Admitting: Nurse Practitioner

## 2024-02-18 VITALS — BP 142/76 | HR 62 | Ht 67.0 in | Wt 162.0 lb

## 2024-02-18 DIAGNOSIS — Z794 Long term (current) use of insulin: Secondary | ICD-10-CM | POA: Diagnosis not present

## 2024-02-18 DIAGNOSIS — E1122 Type 2 diabetes mellitus with diabetic chronic kidney disease: Secondary | ICD-10-CM

## 2024-02-18 DIAGNOSIS — E119 Type 2 diabetes mellitus without complications: Secondary | ICD-10-CM | POA: Diagnosis not present

## 2024-02-18 DIAGNOSIS — N182 Chronic kidney disease, stage 2 (mild): Secondary | ICD-10-CM | POA: Diagnosis not present

## 2024-02-18 DIAGNOSIS — Z7984 Long term (current) use of oral hypoglycemic drugs: Secondary | ICD-10-CM | POA: Diagnosis not present

## 2024-02-18 LAB — POCT GLYCOSYLATED HEMOGLOBIN (HGB A1C): Hemoglobin A1C: 8.2 % — AB (ref 4.0–5.6)

## 2024-02-18 MED ORDER — METFORMIN HCL 500 MG PO TABS: 1000.0000 mg | ORAL_TABLET | Freq: Every day | ORAL | 3 refills | Status: DC

## 2024-02-18 NOTE — Progress Notes (Signed)
 Endocrinology Follow Up Note       02/18/2024, 10:14 AM   Subjective:    Patient ID: Mary Vazquez, female    DOB: Jul 29, 1956.  Mary Vazquez is being seen in follow up after being seen in consultation for management of currently uncontrolled symptomatic diabetes requested by  Tommie Sams, DO.   Past Medical History:  Diagnosis Date   Diabetes (HCC)    Hypertension    PONV (postoperative nausea and vomiting)     Past Surgical History:  Procedure Laterality Date   COLONOSCOPY   08/08/2007   SLF:  A 5-mm sessile descending colon polyp removed via cold forceps.Normal retroflexed view of the rectum/ Small internal hemorrhoids, PATH: tubular adenoma   COLONOSCOPY  01/09/2013   Procedure: COLONOSCOPY;  Surgeon: West Bali, MD;  Location: [facility];  Service: Endoscopy;  Laterality: N/A;  8:30   COLONOSCOPY WITH PROPOFOL N/A 01/12/2023   Procedure: COLONOSCOPY WITH PROPOFOL;  Surgeon: Lanelle Bal, DO;  Location: [facility];  Service: Endoscopy;  Laterality: N/A;  10:30am., asa 2   POLYPECTOMY  01/12/2023   Procedure: POLYPECTOMY;  Surgeon: Lanelle Bal, DO;  Location: [facility];  Service: Endoscopy;;   TUBAL LIGATION      Social History   Socioeconomic History   Marital status: Widowed    Spouse name: Not on file   Number of children: 3   Years of education: Not on file   Highest education level: Some college, no degree  Occupational History   Not on file  Tobacco Use   Smoking status: Never   Smokeless tobacco: Never  Vaping Use   Vaping status: Never Used  Substance and Sexual Activity   Alcohol use: No   Drug use: No   Sexual activity: Not Currently    Birth control/protection: Post-menopausal, Surgical    Comment: tubal  Other Topics Concern   Not on file  Social History Narrative   Husband passed 01/2020.   3 children    2 grandchildren   Social Drivers of  Corporate investment banker Strain: Low Risk  (02/11/2024)   Overall Financial Resource Strain (CARDIA)    Difficulty of Paying Living Expenses: Not very hard  Food Insecurity: No Food Insecurity (02/11/2024)   Hunger Vital Sign    Worried About Running Out of Food in the Last Year: Never true    Ran Out of Food in the Last Year: Never true  Transportation Needs: No Transportation Needs (02/11/2024)   PRAPARE - Transportation    Lack of Transportation (Medical): No    Lack of Transportation (Non-Medical): No  Physical Activity: Sufficiently Active (02/11/2024)   Exercise Vital Sign    Days of Exercise per Week: 5 days    Minutes of Exercise per Session: 60 min  Stress: No Stress Concern Present (02/11/2024)   [employer] of Occupational Health - Occupational Stress Questionnaire    Feeling of Stress : Only a little  Social Connections: Moderately Integrated (02/11/2024)   Social Connection and Isolation Panel [NHANES]    Frequency of Communication with Friends and Family: Three times a week    Frequency of Social Gatherings  with Friends and Family: More than three times a week    Attends Religious Services: More than 4 times per year    Active Member of Clubs or Organizations: Yes    Attends Banker Meetings: 1 to 4 times per year    Marital Status: Widowed    Family History  Problem Relation Age of Onset   Cancer Father        prostate   Diabetes Sister    Diabetes Maternal Grandmother    Diabetes Paternal Aunt    Colon cancer Neg Hx     Outpatient Encounter Medications as of 02/18/2024  Medication Sig   acetaminophen (TYLENOL) 650 MG CR tablet Take 1,300 mg by mouth every 8 (eight) hours as needed for pain.   amLODipine (NORVASC) 5 MG tablet Take 1 tablet (5 mg total) by mouth daily.   atorvastatin (LIPITOR) 20 MG tablet Take 1 tablet (20 mg total) by mouth daily.   cetirizine (ZYRTEC) 10 MG tablet Take 1 tablet (10 mg total) by mouth daily as needed for  allergies.   Cholecalciferol (VITAMIN D3 PO) Take 2,000 Units by mouth daily. Patient reports that this is ain liquid form   enalapril (VASOTEC) 20 MG tablet Take 1 tablet (20 mg total) by mouth 2 (two) times daily.   Insulin Glargine (BASAGLAR KWIKPEN) 100 UNIT/ML Inject 15 Units into the skin at bedtime.   Insulin Pen Needle (PEN NEEDLES) 31G X 6 MM MISC Use to inject insulin once daily   meloxicam (MOBIC) 15 MG tablet Take 1 tablet (15 mg total) by mouth daily as needed for pain.   omeprazole (PRILOSEC) 40 MG capsule Take 1 capsule (40 mg total) by mouth daily.   Polyethyl Glycol-Propyl Glycol (SYSTANE OP) Apply to eye.   Propylene Glycol (SYSTANE BALANCE) 0.6 % SOLN Place 1 drop into both eyes daily as needed (dry eyes).   [DISCONTINUED] metFORMIN (GLUCOPHAGE) 500 MG tablet TAKE 2 TABLETS (1,000 MG TOTAL) BY MOUTH 2 TIMES DAILY WITH A MEAL.   metFORMIN (GLUCOPHAGE) 500 MG tablet Take 2 tablets (1,000 mg total) by mouth daily with breakfast.   No facility-administered encounter medications on file as of 02/18/2024.    ALLERGIES: Allergies  Allergen Reactions   Ozempic (0.25 Or 0.5 Mg-Dose) [Semaglutide(0.25 Or 0.5mg -Dos)] Nausea And Vomiting   Septra [Sulfamethoxazole-Trimethoprim] Swelling and Rash    VACCINATION STATUS: Immunization History  Administered Date(s) Administered   Fluad Quad(high Dose 65+) 10/09/2020, 10/07/2021, 08/25/2022   Fluad Trivalent(High Dose 65+) 11/01/2023   Influenza, High Dose Seasonal PF 10/23/2019   Influenza,inj,Quad PF,6+ Mos 08/23/2014, 11/18/2015, 11/01/2017, 10/18/2018   Influenza-Unspecified 10/01/2016, 10/18/2018   Moderna SARS-COV2 Booster Vaccination 10/30/2020, 06/10/2021   Moderna Sars-Covid-2 Vaccination 03/24/2020, 04/22/2020   PNEUMOCOCCAL CONJUGATE-20 11/24/2022   Td 06/29/2016    Diabetes She presents for her follow-up diabetic visit. She has type 2 diabetes mellitus. Onset time: Diagnosed at approx age of 21 (also had Gest DM) Her  disease course has been improving. There are no hypoglycemic associated symptoms. There are no diabetic associated symptoms. There are no hypoglycemic complications. There are no diabetic complications. Risk factors for coronary artery disease include diabetes mellitus and family history. Current diabetic treatment includes insulin injections and oral agent (monotherapy). She is compliant with treatment most of the time. Her weight is fluctuating minimally. She is following a generally healthy diet. She has had a previous visit with a dietitian. She participates in exercise daily. Her home blood glucose trend is decreasing steadily. Her  breakfast blood glucose range is generally 90-110 mg/dl. Her bedtime blood glucose range is generally 130-140 mg/dl. (She presents today with her logs showing at goal glycemic profile overall.  Her POCT A1c today is 8.2%, improving from last visit of 8.4%.  She denies any significant hypoglycemia.) An ACE inhibitor/angiotensin II receptor blocker is being taken. She does not see a podiatrist.Eye exam is current.   Review of systems  Constitutional: + Minimally fluctuating body weight,  current Body mass index is 25.37 kg/m. , + fatigue, no subjective hyperthermia, no subjective hypothermia Eyes: no blurry vision, no xerophthalmia ENT: no sore throat, no nodules palpated in throat, no dysphagia/odynophagia, no hoarseness Cardiovascular: no chest pain, no shortness of breath, no palpitations, no leg swelling Respiratory: no cough, no shortness of breath Gastrointestinal: no nausea/vomiting/diarrhea Musculoskeletal: no muscle/joint aches Skin: no rashes, no hyperemia Neurological: no tremors, no numbness, no tingling, no dizziness Psychiatric: no depression, no anxiety  Objective:     BP (!) 142/76 (BP Location: Left Arm, Patient Position: Sitting, Cuff Size: Large)   Pulse 62   Ht 5\' 7"  (1.702 m)   Wt 162 lb (73.5 kg)   BMI 25.37 kg/m   Wt Readings from Last 3  Encounters:  02/18/24 162 lb (73.5 kg)  02/15/24 161 lb (73 kg)  01/10/24 158 lb (71.7 kg)     BP Readings from Last 3 Encounters:  02/18/24 (!) 142/76  02/15/24 (!) 144/72  01/25/24 (!) 157/88      Physical Exam- Limited  Constitutional:  Body mass index is 25.37 kg/m. , not in acute distress, flat affect (not her usual bubbly self today) Eyes:  EOMI, no exophthalmos Musculoskeletal: no gross deformities, strength intact in all four extremities, no gross restriction of joint movements Skin:  no rashes, no hyperemia Neurological: no tremor with outstretched hands   Diabetic Foot Exam - Simple   No data filed     CMP ( most recent) CMP     Component Value Date/Time   NA 140 01/10/2024 1132   K 5.1 01/10/2024 1132   CL 103 01/10/2024 1132   CO2 22 01/10/2024 1132   GLUCOSE 155 (H) 01/10/2024 1132   GLUCOSE 128 (H) 01/08/2014 1209   BUN 14 01/10/2024 1132   CREATININE 0.92 01/10/2024 1132   CREATININE 0.91 01/08/2014 1209   CALCIUM 9.6 01/10/2024 1132   PROT 7.6 11/01/2023 1110   ALBUMIN 4.7 11/01/2023 1110   AST 24 11/01/2023 1110   ALT 16 11/01/2023 1110   ALKPHOS 73 11/01/2023 1110   BILITOT 0.4 11/01/2023 1110   GFRNONAA 69 10/18/2020 0821   GFRAA 79 10/18/2020 0821     Diabetic Labs (most recent): Lab Results  Component Value Date   HGBA1C 8.2 (A) 02/18/2024   HGBA1C 8.4 (H) 11/01/2023   HGBA1C 7.8 (A) 07/13/2023   MICROALBUR 30 mg/L 10/05/2022   MICROALBUR 1.22 01/08/2014     Lipid Panel ( most recent) Lipid Panel     Component Value Date/Time   CHOL 172 11/01/2023 1110   TRIG 53 11/01/2023 1110   HDL 93 11/01/2023 1110   CHOLHDL 1.8 11/01/2023 1110   CHOLHDL 2.8 01/08/2014 1209   VLDL 17 01/08/2014 1209   LDLCALC 68 11/01/2023 1110   LABVLDL 11 11/01/2023 1110      Lab Results  Component Value Date   TSH 1.360 11/16/2023   FREET4 0.94 11/16/2023           Assessment & Plan:   1) Type 2  diabetes mellitus with stage 2 chronic  kidney disease, without long-term current use of insulin (HCC)  She presents today with her logs showing at goal glycemic profile overall.  Her POCT A1c today is 8.2%, improving from last visit of 8.4%.  She denies any significant hypoglycemia.  - Mary Vazquez has currently uncontrolled symptomatic type 2 DM since 68 years of age.   -Recent labs reviewed.  - I had a long discussion with her about the progressive nature of diabetes and the pathology behind its complications. -her diabetes is not currently complicated but she remains at a high risk for more acute and chronic complications which include CAD, CVA, CKD, retinopathy, and neuropathy. These are all discussed in detail with her.  The following Lifestyle Medicine recommendations according to American College of Lifestyle Medicine [facility]) were discussed and offered to patient and she agrees to start the journey:  A. Whole Foods, Plant-based plate comprising of fruits and vegetables, plant-based proteins, whole-grain carbohydrates was discussed in detail with the patient.   A list for source of those nutrients were also provided to the patient.  Patient will use only water or unsweetened tea for hydration. B.  The need to stay away from risky substances including alcohol, smoking; obtaining 7 to 9 hours of restorative sleep, at least 150 minutes of moderate intensity exercise weekly, the importance of healthy social connections,  and stress reduction techniques were discussed. C.  A full color page of  Calorie density of various food groups per pound showing examples of each food groups was provided to the patient.  - Nutritional counseling repeated at each appointment due to patients tendency to fall back in to old habits.  - The patient admits there is a room for improvement in their diet and drink choices. -  Suggestion is made for the patient to avoid simple carbohydrates from their diet including Cakes, Sweet Desserts / Pastries,  Ice Cream, Soda (diet and regular), Sweet Tea, Candies, Chips, Cookies, Sweet Pastries, Store Bought Juices, Alcohol in Excess of 1-2 drinks a day, Artificial Sweeteners, Coffee Creamer, and "Sugar-free" Products. This will help patient to have stable blood glucose profile and potentially avoid unintended weight gain.   - I encouraged the patient to switch to unprocessed or minimally processed complex starch and increased protein intake (animal or plant source), fruits, and vegetables.   - Patient is advised to stick to a routine mealtimes to eat 3 meals a day and avoid unnecessary snacks (to snack only to correct hypoglycemia).  - I have approached her with the following individualized plan to manage her diabetes and patient agrees:   -She is advised to continue Metformin 1000 mg po daily and Basaglar 15 units SQ nightly.   -she is encouraged to continue monitoring glucose twice daily, before breakfast and before bed, and to call the clinic if she has readings less than 70 or above 300 for 3 tests in a row.  - Adjustment parameters are given to her for hypo and hyperglycemia in writing.  - her [place] Pepper will be discontinued, risk outweighs benefit for this patient.  She notes significant vaginal yeast with this medication, requiring prescriptions to alleviate.  - she is not an ideal candidate for incretin therapy due to body habitus and BMI less than 25.  She also notes allergy to Ozempic in the past.  - Specific targets for  A1c; LDL, HDL, and Triglycerides were discussed with the patient.  2) Blood Pressure /Hypertension:  her blood pressure is  slightly above target.   she is advised to continue her current medications as prescribed by her PCP  3) Lipids/Hyperlipidemia:    Review of her recent lipid panel from 03/05/23 showed controlled LDL at 48 . She stopped taking her Lipitor as her refills were out.   4)  Weight/Diet:  her Body mass index is 25.37 kg/m.  -   she is NOT a candidate  for weight loss.  Exercise, and detailed carbohydrates information provided  -  detailed on discharge instructions.  5) Chronic Care/Health Maintenance: -she is on ACEI/ARB and Statin medications and is encouraged to initiate and continue to follow up with Ophthalmology, Dentist, Podiatrist at least yearly or according to recommendations, and advised to stay away from smoking. I have recommended yearly flu vaccine and pneumonia vaccine at least every 5 years; moderate intensity exercise for up to 150 minutes weekly; and sleep for at least 7 hours a day.  - she is advised to maintain close follow up with Tommie Sams, DO for primary care needs, as well as her other providers for optimal and coordinated care.  6) Fatigue, Hair loss, Brittle nails Will check comprehensive thyroid panel, vitamin D, B12 and folate to investigate her recent symptoms.  These labs were checked and all came back WNL.     I spent  18  minutes in the care of the patient today including review of labs from CMP, Lipids, Thyroid Function, Hematology (current and previous including abstractions from other facilities); face-to-face time discussing  her blood glucose readings/logs, discussing hypoglycemia and hyperglycemia episodes and symptoms, medications doses, her options of short and long term treatment based on the latest standards of care / guidelines;  discussion about incorporating lifestyle medicine;  and documenting the encounter. Risk reduction counseling performed per USPSTF guidelines to reduce obesity and cardiovascular risk factors.     Please refer to Patient Instructions for Blood Glucose Monitoring and Insulin/Medications Dosing Guide"  in media tab for additional information. Please  also refer to " Patient Self Inventory" in the Media  tab for reviewed elements of pertinent patient history.  Mary Vazquez participated in the discussions, expressed understanding, and voiced agreement with the above plans.   All questions were answered to her satisfaction. she is encouraged to contact clinic should she have any questions or concerns prior to her return visit.     Follow up plan: - Return in about 4 months (around 06/19/2024) for Diabetes F/U with A1c in office, No previsit labs, Bring meter and logs.   Ronny Bacon, [facility] [address] [place], [place] [address] Phone: [phone] Fax: [phone]  02/18/2024, 10:14 AM

## 2024-02-24 ENCOUNTER — Other Ambulatory Visit: Payer: Self-pay | Admitting: Family Medicine

## 2024-03-14 ENCOUNTER — Ambulatory Visit: Payer: Medicare Other | Admitting: Cardiology

## 2024-04-25 ENCOUNTER — Other Ambulatory Visit: Payer: Self-pay | Admitting: Nurse Practitioner

## 2024-06-20 ENCOUNTER — Ambulatory Visit: Admitting: Nurse Practitioner

## 2024-06-20 ENCOUNTER — Encounter: Payer: Self-pay | Admitting: Nurse Practitioner

## 2024-06-20 VITALS — BP 138/86 | HR 64 | Ht 67.0 in | Wt 160.6 lb

## 2024-06-20 DIAGNOSIS — Z7984 Long term (current) use of oral hypoglycemic drugs: Secondary | ICD-10-CM

## 2024-06-20 DIAGNOSIS — E1122 Type 2 diabetes mellitus with diabetic chronic kidney disease: Secondary | ICD-10-CM

## 2024-06-20 DIAGNOSIS — Z794 Long term (current) use of insulin: Secondary | ICD-10-CM

## 2024-06-20 DIAGNOSIS — N182 Chronic kidney disease, stage 2 (mild): Secondary | ICD-10-CM | POA: Diagnosis not present

## 2024-06-20 LAB — POCT GLYCOSYLATED HEMOGLOBIN (HGB A1C): Hemoglobin A1C: 8.1 % — AB (ref 4.0–5.6)

## 2024-06-20 MED ORDER — BASAGLAR KWIKPEN 100 UNIT/ML ~~LOC~~ SOPN: 12.0000 [IU] | PEN_INJECTOR | Freq: Every day | SUBCUTANEOUS | 3 refills | Status: AC

## 2024-06-20 NOTE — Progress Notes (Signed)
 Endocrinology Follow Up Note       06/20/2024, 9:47 AM   Subjective:    Patient ID: Mary Vazquez, female    DOB: 12/26/1955.  Mary Vazquez is being seen in follow up after being seen in consultation for management of currently uncontrolled symptomatic diabetes requested by  Cook, Jayce G, DO.   Past Medical History:  Diagnosis Date   Diabetes (HCC)    Hypertension    PONV (postoperative nausea and vomiting)     Past Surgical History:  Procedure Laterality Date   COLONOSCOPY   08/08/2007   SLF:  A 5-mm sessile descending colon polyp removed via cold forceps.Normal retroflexed view of the rectum/ Small internal hemorrhoids, PATH: tubular adenoma   COLONOSCOPY  01/09/2013   Procedure: COLONOSCOPY;  Surgeon: Margo LITTIE Haddock, MD;  [facility]: [facility];  Service: Endoscopy;  Laterality: N/A;  8:30   COLONOSCOPY WITH PROPOFOL  N/A 01/12/2023   Procedure: COLONOSCOPY WITH PROPOFOL ;  Surgeon: Cindie Carlin POUR, DO;  Location: [facility];  Service: Endoscopy;  Laterality: N/A;  10:30am., asa 2   POLYPECTOMY  01/12/2023   Procedure: POLYPECTOMY;  Surgeon: Cindie Carlin POUR, DO;  Location: [facility];  Service: Endoscopy;;   TUBAL LIGATION      Social History   Socioeconomic History   Marital status: Widowed    Spouse name: Not on file   Number of children: 3   Years of education: Not on file   Highest education level: Some college, no degree  Occupational History   Not on file  Tobacco Use   Smoking status: Never   Smokeless tobacco: Never  Vaping Use   Vaping status: Never Used  Substance and Sexual Activity   Alcohol use: No   Drug use: No   Sexual activity: Not Currently    Birth control/protection: Post-menopausal, Surgical    Comment: tubal  Other Topics Concern   Not on file  Social History Narrative   Husband passed 01/2020.   3 children    2 grandchildren   Social Drivers of  Corporate investment banker Strain: Low Risk  (02/11/2024)   Overall Financial Resource Strain (CARDIA)    Difficulty of Paying Living Expenses: Not very hard  Food Insecurity: No Food Insecurity (02/11/2024)   Hunger Vital Sign    Worried About Running Out of Food in the Last Year: Never true    Ran Out of Food in the Last Year: Never true  Transportation Needs: No Transportation Needs (02/11/2024)   PRAPARE - Transportation    Lack of Transportation (Medical): No    Lack of Transportation (Non-Medical): No  Physical Activity: Sufficiently Active (02/11/2024)   Exercise Vital Sign    Days of Exercise per Week: 5 days    Minutes of Exercise per Session: 60 min  Stress: No Stress Concern Present (02/11/2024)   [employer] of Occupational Health - Occupational Stress Questionnaire    Feeling of Stress : Only a little  Social Connections: Moderately Integrated (02/11/2024)   Social Connection and Isolation Panel    Frequency of Communication with Friends and Family: Three times a week    Frequency of Social Gatherings with  Friends and Family: More than three times a week    Attends Religious Services: More than 4 times per year    Active Member of Clubs or Organizations: Yes    Attends Banker Meetings: 1 to 4 times per year    Marital Status: Widowed    Family History  Problem Relation Age of Onset   Cancer Father        prostate   Diabetes Sister    Diabetes Maternal Grandmother    Diabetes Paternal Aunt    Colon cancer Neg Hx     Outpatient Encounter Medications as of 06/20/2024  Medication Sig   acetaminophen  (TYLENOL ) 650 MG CR tablet Take 1,300 mg by mouth every 8 (eight) hours as needed for pain.   amLODipine  (NORVASC ) 5 MG tablet Take 1 tablet (5 mg total) by mouth daily.   atorvastatin  (LIPITOR) 20 MG tablet Take 1 tablet (20 mg total) by mouth daily.   cetirizine  (ZYRTEC ) 10 MG tablet Take 1 tablet (10 mg total) by mouth daily as needed for allergies.    Cholecalciferol (VITAMIN D3 PO) Take 2,000 Units by mouth daily. Patient reports that this is ain liquid form   enalapril  (VASOTEC ) 20 MG tablet Take 1 tablet (20 mg total) by mouth 2 (two) times daily.   Insulin Pen Needle (B-D UF III MINI PEN NEEDLES) 31G X 5 MM MISC USE TO INJECT INSULIN ONCE DAILY   meloxicam  (MOBIC ) 15 MG tablet Take 1 tablet (15 mg total) by mouth daily as needed for pain.   metFORMIN  (GLUCOPHAGE ) 500 MG tablet Take 2 tablets (1,000 mg total) by mouth daily with breakfast.   Polyethyl Glycol-Propyl Glycol (SYSTANE OP) Apply to eye.   Propylene Glycol (SYSTANE BALANCE) 0.6 % SOLN Place 1 drop into both eyes daily as needed (dry eyes).   [DISCONTINUED] Insulin Glargine  (BASAGLAR  KWIKPEN) 100 UNIT/ML Inject 15 Units into the skin at bedtime.   Insulin Glargine  (BASAGLAR  KWIKPEN) 100 UNIT/ML Inject 12 Units into the skin at bedtime.   [DISCONTINUED] omeprazole  (PRILOSEC) 40 MG capsule TAKE 1 CAPSULE (40 MG TOTAL) BY MOUTH DAILY.   No facility-administered encounter medications on file as of 06/20/2024.    ALLERGIES: Allergies  Allergen Reactions   Ozempic  (0.25 Or 0.5 Mg-Dose) [Semaglutide (0.25 Or 0.5mg -Dos)] Nausea And Vomiting   Septra [Sulfamethoxazole-Trimethoprim] Swelling and Rash    VACCINATION STATUS: Immunization History  Administered Date(s) Administered   Fluad Quad(high Dose 65+) 10/09/2020, 10/07/2021, 08/25/2022   Fluad Trivalent(High Dose 65+) 11/01/2023   Influenza, High Dose Seasonal PF 10/23/2019   Influenza,inj,Quad PF,6+ Mos 08/23/2014, 11/18/2015, 11/01/2017, 10/18/2018   Influenza-Unspecified 10/01/2016, 10/18/2018   Moderna SARS-COV2 Booster Vaccination 10/30/2020, 06/10/2021   Moderna Sars-Covid-2 Vaccination 03/24/2020, 04/22/2020   PNEUMOCOCCAL CONJUGATE-20 11/24/2022   Td 06/29/2016    Diabetes She presents for her follow-up diabetic visit. She has type 2 diabetes mellitus. Onset time: Diagnosed at approx age of 66 (also had Gest DM) Her  disease course has been improving. There are no hypoglycemic associated symptoms. There are no diabetic associated symptoms. There are no hypoglycemic complications. There are no diabetic complications. Risk factors for coronary artery disease include diabetes mellitus and family history. Current diabetic treatment includes insulin injections and oral agent (monotherapy). She is compliant with treatment most of the time. Her weight is fluctuating minimally. She is following a generally healthy diet. She has had a previous visit with a dietitian. She participates in exercise daily. Her home blood glucose trend is decreasing steadily. Her breakfast  blood glucose range is generally 90-110 mg/dl. Her bedtime blood glucose range is generally 130-140 mg/dl. (She presents today with her logs showing at goal glycemic profile overall.  Her POCT A1c today is 8.1%, improving from last visit of 8.2%.  She does have several days of fasting hypoglycemia noted within the last month, for which she does get symptoms.  There seems to be some discrepancy between her glucose readings and A1c.) An ACE inhibitor/angiotensin II receptor blocker is being taken. She does not see a podiatrist.Eye exam is current.   Review of systems  Constitutional: + Minimally fluctuating body weight,  current Body mass index is 25.15 kg/m. , + fatigue, no subjective hyperthermia, no subjective hypothermia Eyes: no blurry vision, no xerophthalmia ENT: no sore throat, no nodules palpated in throat, no dysphagia/odynophagia, no hoarseness Cardiovascular: no chest pain, no shortness of breath, no palpitations, no leg swelling Respiratory: no cough, no shortness of breath Gastrointestinal: no nausea/vomiting/diarrhea Musculoskeletal: no muscle/joint aches Skin: no rashes, no hyperemia Neurological: no tremors, no numbness, no tingling, no dizziness Psychiatric: no depression, no anxiety  Objective:     BP 138/86 (BP Location: Left Arm,  Patient Position: Sitting, Cuff Size: Large)   Pulse 64   Ht 5' 7 (1.702 m)   Wt 160 lb 9.6 oz (72.8 kg)   BMI 25.15 kg/m   Wt Readings from Last 3 Encounters:  06/20/24 160 lb 9.6 oz (72.8 kg)  02/18/24 162 lb (73.5 kg)  02/15/24 161 lb (73 kg)     BP Readings from Last 3 Encounters:  06/20/24 138/86  02/18/24 (!) 142/76  02/15/24 (!) 144/72      Physical Exam- Limited  Constitutional:  Body mass index is 25.15 kg/m. , not in acute distress, + flat affect Eyes:  EOMI, no exophthalmos Musculoskeletal: no gross deformities, strength intact in all four extremities, no gross restriction of joint movements Skin:  no rashes, no hyperemia Neurological: no tremor with outstretched hands   Diabetic Foot Exam - Simple   No data filed     CMP ( most recent) CMP     Component Value Date/Time   NA 140 01/10/2024 1132   K 5.1 01/10/2024 1132   CL 103 01/10/2024 1132   CO2 22 01/10/2024 1132   GLUCOSE 155 (H) 01/10/2024 1132   GLUCOSE 128 (H) 01/08/2014 1209   BUN 14 01/10/2024 1132   CREATININE 0.92 01/10/2024 1132   CREATININE 0.91 01/08/2014 1209   CALCIUM  9.6 01/10/2024 1132   PROT 7.6 11/01/2023 1110   ALBUMIN 4.7 11/01/2023 1110   AST 24 11/01/2023 1110   ALT 16 11/01/2023 1110   ALKPHOS 73 11/01/2023 1110   BILITOT 0.4 11/01/2023 1110   GFRNONAA 69 10/18/2020 0821   GFRAA 79 10/18/2020 0821     Diabetic Labs (most recent): Lab Results  Component Value Date   HGBA1C 8.1 (A) 06/20/2024   HGBA1C 8.2 (A) 02/18/2024   HGBA1C 8.4 (H) 11/01/2023   MICROALBUR 30 mg/L 10/05/2022   MICROALBUR 1.22 01/08/2014     Lipid Panel ( most recent) Lipid Panel     Component Value Date/Time   CHOL 172 11/01/2023 1110   TRIG 53 11/01/2023 1110   HDL 93 11/01/2023 1110   CHOLHDL 1.8 11/01/2023 1110   CHOLHDL 2.8 01/08/2014 1209   VLDL 17 01/08/2014 1209   LDLCALC 68 11/01/2023 1110   LABVLDL 11 11/01/2023 1110      Lab Results  Component Value Date   TSH 1.360  11/16/2023   FREET4 0.94 11/16/2023           Assessment & Plan:   1) Type 2 diabetes mellitus with stage 2 chronic kidney disease, without long-term current use of insulin (HCC)  She presents today with her logs showing at goal glycemic profile overall.  Her POCT A1c today is 8.1%, improving from last visit of 8.2%.  She does have several days of fasting hypoglycemia noted within the last month, for which she does get symptoms.  There seems to be some discrepancy between her glucose readings and A1c.  - Mary Vazquez has currently uncontrolled symptomatic type 2 DM since 68 years of age.   -Recent labs reviewed.  - I had a long discussion with her about the progressive nature of diabetes and the pathology behind its complications. -her diabetes is not currently complicated but she remains at a high risk for more acute and chronic complications which include CAD, CVA, CKD, retinopathy, and neuropathy. These are all discussed in detail with her.  The following Lifestyle Medicine recommendations according to American College of Lifestyle Medicine [facility]) were discussed and offered to patient and she agrees to start the journey:  A. Whole Foods, Plant-based plate comprising of fruits and vegetables, plant-based proteins, whole-grain carbohydrates was discussed in detail with the patient.   A list for source of those nutrients were also provided to the patient.  Patient will use only water  or unsweetened tea for hydration. B.  The need to stay away from risky substances including alcohol, smoking; obtaining 7 to 9 hours of restorative sleep, at least 150 minutes of moderate intensity exercise weekly, the importance of healthy social connections,  and stress reduction techniques were discussed. C.  A full color page of  Calorie density of various food groups per pound showing examples of each food groups was provided to the patient.  - Nutritional counseling repeated at each appointment due  to patients tendency to fall back in to old habits.  - The patient admits there is a room for improvement in their diet and drink choices. -  Suggestion is made for the patient to avoid simple carbohydrates from their diet including Cakes, Sweet Desserts / Pastries, Ice Cream, Soda (diet and regular), Sweet Tea, Candies, Chips, Cookies, Sweet Pastries, Store Bought Juices, Alcohol in Excess of 1-2 drinks a day, Artificial Sweeteners, Coffee Creamer, and Sugar-free Products. This will help patient to have stable blood glucose profile and potentially avoid unintended weight gain.   - I encouraged the patient to switch to unprocessed or minimally processed complex starch and increased protein intake (animal or plant source), fruits, and vegetables.   - Patient is advised to stick to a routine mealtimes to eat 3 meals a day and avoid unnecessary snacks (to snack only to correct hypoglycemia).  - I have approached her with the following individualized plan to manage her diabetes and patient agrees:   -She is advised to continue Metformin  1000 mg po daily (she currently takes 500 mg in am and 500 mg in the evening) and reduce her Basaglar  to 12 units SQ nightly to help avoid fasting hypoglycemia.   -she is encouraged to continue monitoring glucose twice daily, before breakfast and before bed, and to call the clinic if she has readings less than 70 or above 300 for 3 tests in a row.  I did offer to try CGM today but she politely declined, is content finger sticking for sugar readings for now.  - Adjustment parameters  are given to her for hypo and hyperglycemia in writing.  - her Jardiance  will be discontinued, risk outweighs benefit for this patient.  She notes significant vaginal yeast with this medication, requiring prescriptions to alleviate.  - she is not an ideal candidate for incretin therapy due to body habitus and BMI less than 25.  She also notes allergy to Ozempic  in the past.  - Specific  targets for  A1c; LDL, HDL, and Triglycerides were discussed with the patient.  2) Blood Pressure /Hypertension:  her blood pressure is slightly above target.   she is advised to continue her current medications as prescribed by her PCP.  3) Lipids/Hyperlipidemia:    Review of her recent lipid panel from 03/05/23 showed controlled LDL at 48 . She is back on her Lipitor 20 mg daily.  She has appt coming up with her PCP in September.  Will defer labs to him.  4)  Weight/Diet:  her Body mass index is 25.15 kg/m.  -   she is NOT a candidate for weight loss.  Exercise, and detailed carbohydrates information provided  -  detailed on discharge instructions.  5) Chronic Care/Health Maintenance: -she is on ACEI/ARB and Statin medications and is encouraged to initiate and continue to follow up with Ophthalmology, Dentist, Podiatrist at least yearly or according to recommendations, and advised to stay away from smoking. I have recommended yearly flu vaccine and pneumonia vaccine at least every 5 years; moderate intensity exercise for up to 150 minutes weekly; and sleep for at least 7 hours a day.  - she is advised to maintain close follow up with Cook, Jayce G, DO for primary care needs, as well as her other providers for optimal and coordinated care.  6) Fatigue, Hair loss, Brittle nails Will check comprehensive thyroid  panel, vitamin D , B12 and folate to investigate her recent symptoms.  These labs were checked and all came back WNL.     I spent  31  minutes in the care of the patient today including review of labs from CMP, Lipids, Thyroid  Function, Hematology (current and previous including abstractions from other facilities); face-to-face time discussing  her blood glucose readings/logs, discussing hypoglycemia and hyperglycemia episodes and symptoms, medications doses, her options of short and long term treatment based on the latest standards of care / guidelines;  discussion about incorporating  lifestyle medicine;  and documenting the encounter. Risk reduction counseling performed per USPSTF guidelines to reduce obesity and cardiovascular risk factors.     Please refer to Patient Instructions for Blood Glucose Monitoring and Insulin/Medications Dosing Guide  in media tab for additional information. Please  also refer to  Patient Self Inventory in the Media  tab for reviewed elements of pertinent patient history.  Mary Vazquez participated in the discussions, expressed understanding, and voiced agreement with the above plans.  All questions were answered to her satisfaction. she is encouraged to contact clinic should she have any questions or concerns prior to her return visit.     Follow up plan: - Return in about 4 months (around 10/21/2024) for Diabetes F/U with A1c in office, Bring meter and logs, No previsit labs.   Benton Rio, [facility] [address] [place], [place] [address] Phone: [phone] Fax: [phone]  06/20/2024, 9:47 AM

## 2024-06-22 ENCOUNTER — Other Ambulatory Visit: Payer: Self-pay | Admitting: Family Medicine

## 2024-07-21 ENCOUNTER — Other Ambulatory Visit: Payer: Self-pay | Admitting: Nurse Practitioner

## 2024-07-21 DIAGNOSIS — E119 Type 2 diabetes mellitus without complications: Secondary | ICD-10-CM

## 2024-08-04 ENCOUNTER — Ambulatory Visit

## 2024-08-04 VITALS — Ht 67.0 in | Wt 160.0 lb

## 2024-08-04 DIAGNOSIS — Z0000 Encounter for general adult medical examination without abnormal findings: Secondary | ICD-10-CM

## 2024-08-04 DIAGNOSIS — Z1231 Encounter for screening mammogram for malignant neoplasm of breast: Secondary | ICD-10-CM

## 2024-08-04 DIAGNOSIS — Z780 Asymptomatic menopausal state: Secondary | ICD-10-CM | POA: Diagnosis not present

## 2024-08-04 NOTE — Patient Instructions (Signed)
 Ms. Shrader , Thank you for taking time out of your busy schedule to complete your Annual Wellness Visit with me. I enjoyed our conversation and look forward to speaking with you again next year. I, as well as your care team,  appreciate your ongoing commitment to your health goals. Please review the following plan we discussed and let me know if I can assist you in the future. Your Game plan/ To Do List    Referrals: You have an order for:  []   2D Mammogram  [x]   3D Mammogram  [x]   Bone Density     Please call for appointment:   [facility] [address]iology [place], [place] [address] [phone]  Make sure to wear two-piece clothing.  No lotions, powders, or deodorants the day of the appointment. Make sure to bring picture ID and insurance card.  Bring list of medications you are currently taking including any supplements.   Follow up Visits: We will see or speak with you next year for your Next Medicare AWV with our clinical staff Have you seen your provider in the last 6 months (3 months if uncontrolled diabetes)? Yes  Clinician Recommendations:  Aim for 30 minutes of exercise or brisk walking, 6-8 glasses of water , and 5 servings of fruits and vegetables each day.       This is a list of the screenings recommended for you:  Health Maintenance  Topic Date Due   Zoster (Shingles) Vaccine (1 of 2) Never done   Eye exam for diabetics  02/22/2024   Flu Shot  07/14/2024   Yearly kidney health urinalysis for diabetes  10/31/2024   Hemoglobin A1C  12/21/2024   Yearly kidney function blood test for diabetes  01/09/2025   Complete foot exam   02/14/2025   Medicare Annual Wellness Visit  08/04/2025   Mammogram  08/12/2025   DTaP/Tdap/Td vaccine (2 - Tdap) 06/29/2026   Colon Cancer Screening  01/12/2033   Pneumococcal Vaccine for age over 94  Completed   DEXA scan (bone density measurement)  Completed   HPV Vaccine  Aged Out   Meningitis B  Vaccine  Aged Out   COVID-19 Vaccine  Discontinued   Hepatitis C Screening  Discontinued    Advanced directives: (ACP Link)Information on Advanced Care Planning can be found at [place]  Secretary of [facility] Advance Health Care Directives Advance Health Care Directives. [email or web address]   Advance Care Planning is important because it:  [x]  Makes sure you receive the medical care that is consistent with your values, goals, and preferences  [x]  It provides guidance to your family and loved ones and reduces their decisional burden about whether or not they are making the right decisions based on your wishes.  Follow the link provided in your after visit summary or read over the paperwork we have mailed to you to help you started getting your Advance Directives in place. If you need assistance in completing these, please reach out to us  so that we can help you!  See attachments for Preventive Care and Fall Prevention Tips.

## 2024-08-04 NOTE — Progress Notes (Signed)
 Subjective:   Mary Vazquez is a 68 y.o. who presents for a Medicare Wellness preventive visit.  As a reminder, Annual Wellness Visits don't include a physical exam, and some assessments may be limited, especially if this visit is performed virtually. We may recommend an in-person follow-up visit with your provider if needed.  Visit Complete: Virtual I connected with  Mary Vazquez on 08/04/24 by a audio enabled telemedicine application and verified that I am speaking with the correct person using two identifiers.  Patient Location: Home  Provider Location: Home Office  I discussed the limitations of evaluation and management by telemedicine. The patient expressed understanding and agreed to proceed.  Vital Signs: Because this visit was a virtual/telehealth visit, some criteria may be missing or patient reported. Any vitals not documented were not able to be obtained and vitals that have been documented are patient reported.  VideoDeclined- This patient declined Librarian, academic. Therefore the visit was completed with audio only.  Persons Participating in Visit: Patient.  AWV Questionnaire: Yes: Patient Medicare AWV questionnaire was completed by the patient on 08/03/24; I have confirmed that all information answered by patient is correct and no changes since this date.  Cardiac Risk Factors include: advanced age (>109men, >85 women);diabetes mellitus;hypertension     Objective:    Today's Vitals   08/04/24 0847  Weight: 160 lb (72.6 kg)  Height: 5' 7 (1.702 m)   Body mass index is 25.06 kg/m.     08/04/2024    8:57 AM 01/12/2023    8:44 AM 01/01/2023   11:27 AM 12/23/2021    9:25 AM 06/29/2017   10:40 AM 03/02/2016    8:12 AM 04/10/2015   11:26 AM  Advanced Directives  Does Patient Have a Medical Advance Directive? No No No No No  No  No   Would patient like information on creating a medical advance directive? Yes  (MAU/Ambulatory/Procedural Areas - Information given) No - Patient declined Yes (MAU/Ambulatory/Procedural Areas - Information given) No - Patient declined  No - patient declined information  No - patient declined information      Data saved with a previous flowsheet row definition    Current Medications (verified) Outpatient Encounter Medications as of 08/04/2024  Medication Sig   acetaminophen  (TYLENOL ) 650 MG CR tablet Take 1,300 mg by mouth every 8 (eight) hours as needed for pain.   amLODipine  (NORVASC ) 5 MG tablet Take 1 tablet (5 mg total) by mouth daily.   atorvastatin  (LIPITOR) 20 MG tablet Take 1 tablet (20 mg total) by mouth daily.   cetirizine  (ZYRTEC ) 10 MG tablet TAKE 1 TABLET BY MOUTH EVERY DAY AS NEEDED FOR ALLERGY   Cholecalciferol (VITAMIN D3 PO) Take 2,000 Units by mouth daily. Patient reports that this is ain liquid form   enalapril  (VASOTEC ) 20 MG tablet Take 1 tablet (20 mg total) by mouth 2 (two) times daily.   Insulin Glargine  (BASAGLAR  KWIKPEN) 100 UNIT/ML Inject 12 Units into the skin at bedtime.   Insulin Pen Needle (B-D UF III MINI PEN NEEDLES) 31G X 5 MM MISC USE TO INJECT INSULIN ONCE DAILY   meloxicam  (MOBIC ) 15 MG tablet Take 1 tablet (15 mg total) by mouth daily as needed for pain.   metFORMIN  (GLUCOPHAGE ) 500 MG tablet Take 1 tablet (500 mg total) by mouth 2 (two) times daily with a meal.   Polyethyl Glycol-Propyl Glycol (SYSTANE OP) Apply to eye.   Propylene Glycol (SYSTANE BALANCE) 0.6 % SOLN Place  1 drop into both eyes daily as needed (dry eyes).   No facility-administered encounter medications on file as of 08/04/2024.    Allergies (verified) Ozempic  (0.25 or 0.5 mg-dose) [semaglutide (0.25 or 0.5mg -dos)] and Septra [sulfamethoxazole-trimethoprim]   History: Past Medical History:  Diagnosis Date   Diabetes (HCC)    Hypertension    PONV (postoperative nausea and vomiting)    Past Surgical History:  Procedure Laterality Date   COLONOSCOPY    08/08/2007   SLF:  A 5-mm sessile descending colon polyp removed via cold forceps.Normal retroflexed view of the rectum/ Small internal hemorrhoids, PATH: tubular adenoma   COLONOSCOPY  01/09/2013   Procedure: COLONOSCOPY;  Surgeon: Margo LITTIE Haddock, MD;  [facility];  Service: Endoscopy;  Laterality: N/A;  8:30   COLONOSCOPY WITH PROPOFOL  N/A 01/12/2023   Procedure: COLONOSCOPY WITH PROPOFOL ;  Surgeon: Cindie Carlin POUR, DO;  Location: [facility];  Service: Endoscopy;  Laterality: N/A;  10:30am., asa 2   POLYPECTOMY  01/12/2023   Procedure: POLYPECTOMY;  Surgeon: Cindie Carlin POUR, DO;  Location: [facility];  Service: Endoscopy;;   TUBAL LIGATION     Family History  Problem Relation Age of Onset   Cancer Father        prostate   Diabetes Sister    Diabetes Maternal Grandmother    Diabetes Paternal Aunt    Colon cancer Neg Hx    Social History   Socioeconomic History   Marital status: Widowed    Spouse name: Not on file   Number of children: 3   Years of education: Not on file   Highest education level: Some college, no degree  Occupational History   Not on file  Tobacco Use   Smoking status: Never   Smokeless tobacco: Never  Vaping Use   Vaping status: Never Used  Substance and Sexual Activity   Alcohol use: No   Drug use: No   Sexual activity: Not Currently    Birth control/protection: Post-menopausal, Surgical    Comment: tubal  Other Topics Concern   Not on file  Social History Narrative   Husband passed 01/2020.   3 children    2 grandchildren   Social Drivers of Corporate investment banker Strain: Low Risk  (08/04/2024)   Overall Financial Resource Strain (CARDIA)    Difficulty of Paying Living Expenses: Not very hard  Food Insecurity: No Food Insecurity (08/04/2024)   Hunger Vital Sign    Worried About Running Out of Food in the Last Year: Never true    Ran Out of Food in the Last Year: Never true  Transportation Needs: No Transportation Needs  (08/04/2024)   PRAPARE - Transportation    Lack of Transportation (Medical): No    Lack of Transportation (Non-Medical): No  Physical Activity: Sufficiently Active (08/04/2024)   Exercise Vital Sign    Days of Exercise per Week: 5 days    Minutes of Exercise per Session: 60 min  Stress: No Stress Concern Present (08/04/2024)   [employer] of Occupational Health - Occupational Stress Questionnaire    Feeling of Stress: Not at all  Social Connections: Moderately Integrated (08/04/2024)   Social Connection and Isolation Panel    Frequency of Communication with Friends and Family: Three times a week    Frequency of Social Gatherings with Friends and Family: More than three times a week    Attends Religious Services: More than 4 times per year    Active Member of [employer] or Organizations:  Yes    Attends Club or Organization Meetings: 1 to 4 times per year    Marital Status: Widowed    Tobacco Counseling Counseling given: Not Answered    Clinical Intake:  Pre-visit preparation completed: Yes  Pain : No/denies pain  Diabetes: Yes CBG done?: No Did pt. bring in CBG monitor from home?: No  Lab Results  Component Value Date   HGBA1C 8.1 (A) 06/20/2024   HGBA1C 8.2 (A) 02/18/2024   HGBA1C 8.4 (H) 11/01/2023     How often do you need to have someone help you when you read instructions, pamphlets, or other written materials from your doctor or pharmacy?: 1 - Never  Interpreter Needed?: No  Information entered by :: Charmaine Bloodgood LPN   Activities of Daily Living     08/03/2024    2:35 PM  In your present state of health, do you have any difficulty performing the following activities:  Hearing? 0  Vision? 0  Difficulty concentrating or making decisions? 0  Walking or climbing stairs? 0  Dressing or bathing? 0  Doing errands, shopping? 0  Preparing Food and eating ? N  Using the Toilet? N  In the past six months, have you accidently leaked urine? N  Do you have  problems with loss of bowel control? N  Managing your Medications? N  Managing your Finances? N  Housekeeping or managing your Housekeeping? N    Patient Care Team: Cook, Jayce G, DO as PCP - General (Family Medicine) Anner Alm ORN, MD as PCP - Cardiology (Cardiology) Signa Delon LABOR, NP as Nurse Practitioner (Obstetrics and Gynecology) Kristine Ronal BIRCH, RD as Dietitian (Nutrition) Therisa Benton PARAS, NP as Nurse Practitioner (Nurse Practitioner) [facility]  I have updated your Care Teams any recent Medical Services you may have received from other providers in the past year.     Assessment:   This is a routine wellness examination for Tinlee.  Hearing/Vision screen Hearing Screening - Comments:: Denies hearing difficulties   Vision Screening - Comments:: Wears rx glasses - up to date with routine eye exams with [facility])    Goals Addressed             This Visit's Progress    Remain active and independent   On track      Depression Screen     08/04/2024    8:56 AM 02/15/2024    9:09 AM 11/22/2023   11:35 AM 11/01/2023   10:03 AM 04/28/2023   11:09 AM 04/05/2023    1:48 PM 01/01/2023   11:21 AM  PHQ 2/9 Scores  PHQ - 2 Score 0 0 0 0 0 0 0  PHQ- 9 Score  0 0 0 1 1     Fall Risk     08/03/2024    2:35 PM 11/22/2023   11:41 AM 01/01/2023   11:19 AM 12/31/2022    8:40 PM 11/24/2022    9:36 AM  Fall Risk   Falls in the past year? 0 0 0 0 0  Number falls in past yr: 0 0 0    Injury with Fall? 0 0 0 0   Risk for fall due to : No Fall Risks    No Fall Risks  Follow up Falls prevention discussed;Education provided;Falls evaluation completed  Falls prevention discussed;Education provided;Falls evaluation completed   Falls evaluation completed      Data saved with a previous flowsheet row definition    MEDICARE RISK  AT HOME:  Medicare Risk at Home Any stairs in or around the home?: No If so, are there any without  handrails?: (Patient-Rptd) No Home free of loose throw rugs in walkways, pet beds, electrical cords, etc?: (Patient-Rptd) Yes Adequate lighting in your home to reduce risk of falls?: (Patient-Rptd) Yes Life alert?: (Patient-Rptd) No Use of a cane, walker or w/c?: (Patient-Rptd) No Grab bars in the bathroom?: (Patient-Rptd) Yes Shower chair or bench in shower?: (Patient-Rptd) No Elevated toilet seat or a handicapped toilet?: (Patient-Rptd) Yes  TIMED UP AND GO:  Was the test performed?  No  Cognitive Function: Declined/Normal: No cognitive concerns noted by patient or family. Patient alert, oriented, able to answer questions appropriately and recall recent events. No signs of memory loss or confusion.        01/01/2023   11:28 AM 12/23/2021    9:30 AM  6CIT Screen  What Year? 0 points 0 points  What month? 0 points 0 points  What time? 0 points 0 points  Count back from 20 0 points 0 points  Months in reverse 0 points 0 points  Repeat phrase 0 points 2 points  Total Score 0 points 2 points    Immunizations Immunization History  Administered Date(s) Administered   Fluad Quad(high Dose 65+) 10/09/2020, 10/07/2021, 08/25/2022   Fluad Trivalent(High Dose 65+) 11/01/2023   Influenza, High Dose Seasonal PF 10/23/2019   Influenza,inj,Quad PF,6+ Mos 08/23/2014, 11/18/2015, 11/01/2017, 10/18/2018   Influenza-Unspecified 10/01/2016, 10/18/2018   Moderna SARS-COV2 Booster Vaccination 10/30/2020, 06/10/2021   Moderna Sars-Covid-2 Vaccination 03/24/2020, 04/22/2020   PNEUMOCOCCAL CONJUGATE-20 11/24/2022   Td 06/29/2016    Screening Tests Health Maintenance  Topic Date Due   Zoster Vaccines- Shingrix (1 of 2) Never done   OPHTHALMOLOGY EXAM  02/22/2024   INFLUENZA VACCINE  07/14/2024   Diabetic kidney evaluation - Urine ACR  10/31/2024   HEMOGLOBIN A1C  12/21/2024   Diabetic kidney evaluation - eGFR measurement  01/09/2025   FOOT EXAM  02/14/2025   Medicare Annual Wellness (AWV)   08/04/2025   MAMMOGRAM  08/12/2025   DTaP/Tdap/Td (2 - Tdap) 06/29/2026   Colonoscopy  01/12/2033   Pneumococcal Vaccine: 50+ Years  Completed   DEXA SCAN  Completed   HPV VACCINES  Aged Out   Meningococcal B Vaccine  Aged Out   COVID-19 Vaccine  Discontinued   Hepatitis C Screening  Discontinued    Health Maintenance  Health Maintenance Due  Topic Date Due   Zoster Vaccines- Shingrix (1 of 2) Never done   OPHTHALMOLOGY EXAM  02/22/2024   INFLUENZA VACCINE  07/14/2024   Health Maintenance Items Addressed: Mammogram ordered, DEXA ordered  Additional Screening:  Vision Screening: Recommended annual ophthalmology exams for early detection of glaucoma and other disorders of the eye. Would you like a referral to an eye doctor? No    Dental Screening: Recommended annual dental exams for proper oral hygiene  Community Resource Referral / Chronic Care Management: CRR required this visit?  No   CCM required this visit?  No   Plan:    I have personally reviewed and noted the following in the patient's chart:   Medical and social history Use of alcohol, tobacco or illicit drugs  Current medications and supplements including opioid prescriptions. Patient is not currently taking opioid prescriptions. Functional ability and status Nutritional status Physical activity Advanced directives List of other physicians Hospitalizations, surgeries, and ER visits in previous 12 months Vitals Screenings to include cognitive, depression, and falls Referrals and  appointments  In addition, I have reviewed and discussed with patient certain preventive protocols, quality metrics, and best practice recommendations. A written personalized care plan for preventive services as well as general preventive health recommendations were provided to patient.   Lavelle Pfeiffer [place], [place]   1/77/7974   After Visit Summary: (MyChart) Due to this being a telephonic visit, the after visit summary  with patients personalized plan was offered to patient via MyChart   Notes: Nothing significant to report at this time.

## 2024-08-17 ENCOUNTER — Ambulatory Visit (HOSPITAL_COMMUNITY)
Admission: RE | Admit: 2024-08-17 | Discharge: 2024-08-17 | Disposition: A | Discharge status: Home / Self Care | Source: Ambulatory Visit | Attending: Family Medicine | Admitting: Family Medicine

## 2024-08-17 ENCOUNTER — Encounter (HOSPITAL_COMMUNITY): Payer: Self-pay

## 2024-08-17 DIAGNOSIS — Z780 Asymptomatic menopausal state: Secondary | ICD-10-CM | POA: Diagnosis present

## 2024-08-17 DIAGNOSIS — Z1231 Encounter for screening mammogram for malignant neoplasm of breast: Secondary | ICD-10-CM | POA: Diagnosis present

## 2024-08-18 ENCOUNTER — Ambulatory Visit: Admitting: Family Medicine

## 2024-08-18 VITALS — BP 138/78 | Ht 67.0 in | Wt 156.4 lb

## 2024-08-18 DIAGNOSIS — R195 Other fecal abnormalities: Secondary | ICD-10-CM | POA: Diagnosis not present

## 2024-08-18 DIAGNOSIS — Z23 Encounter for immunization: Secondary | ICD-10-CM | POA: Diagnosis not present

## 2024-08-18 DIAGNOSIS — E119 Type 2 diabetes mellitus without complications: Secondary | ICD-10-CM

## 2024-08-18 DIAGNOSIS — Z7984 Long term (current) use of oral hypoglycemic drugs: Secondary | ICD-10-CM

## 2024-08-18 DIAGNOSIS — I10 Essential (primary) hypertension: Secondary | ICD-10-CM | POA: Diagnosis not present

## 2024-08-18 DIAGNOSIS — E785 Hyperlipidemia, unspecified: Secondary | ICD-10-CM

## 2024-08-18 DIAGNOSIS — E1169 Type 2 diabetes mellitus with other specified complication: Secondary | ICD-10-CM | POA: Diagnosis not present

## 2024-08-18 DIAGNOSIS — Z794 Long term (current) use of insulin: Secondary | ICD-10-CM

## 2024-08-18 MED ORDER — METFORMIN HCL 1000 MG PO TABS: 1000.0000 mg | ORAL_TABLET | Freq: Two times a day (BID) | ORAL | 3 refills | Status: DC

## 2024-08-18 NOTE — Patient Instructions (Signed)
 Labs today.  Metformin  increased.  Follow up in 6 months.

## 2024-08-18 NOTE — Progress Notes (Signed)
 Subjective:  Patient ID: Mary Vazquez, female    DOB: 03-24-1956  Age: 68 y.o. MRN: [id number]  CC:   Chief Complaint  Patient presents with   Hypertension    Follow up- would like to discuss previous spinal x rays    HPI:  68 year old female with the below mentioned medical problems presents for follow-up.  Hypertension stable.   Diabetes uncontrolled.  Most recent A1c 8.1.  Follows with endocrinology.  Endocrinology decreased insulin dose.  She is currently on metformin  500 mg twice daily.  Will discuss increase today.  Patient reports that this month she has had intermittent mucus in her stool.  She is quite concerned about this.  She also reports ongoing belching.  No significant abdominal pain.  She has had some intermittent loose stools.  Patient is in need of a flu shot.  She is amenable to this today.  Patient Active Problem List   Diagnosis Date Noted   Mucus in stool 08/20/2024   Right shoulder pain 04/05/2023   Lipoma 08/26/2022   Hand pain 05/19/2022   Hyperlipidemia associated with type 2 diabetes mellitus (HCC) 11/18/2021   Primary hypertension 03/02/2013   Type 2 diabetes mellitus without complication, with long-term current use of insulin (HCC) 03/02/2013   GERD (gastroesophageal reflux disease) 03/02/2013    Social Hx   Social History   Socioeconomic History   Marital status: Widowed    Spouse name: Not on file   Number of children: 3   Years of education: Not on file   Highest education level: Some college, no degree  Occupational History   Not on file  Tobacco Use   Smoking status: Never   Smokeless tobacco: Never  Vaping Use   Vaping status: Never Used  Substance and Sexual Activity   Alcohol use: No   Drug use: No   Sexual activity: Not Currently    Birth control/protection: Post-menopausal, Surgical    Comment: tubal  Other Topics Concern   Not on file  Social History Narrative   Husband passed 01/2020.   3 children    2  grandchildren   Social Drivers of Corporate investment banker Strain: Low Risk  (08/04/2024)   Overall Financial Resource Strain (CARDIA)    Difficulty of Paying Living Expenses: Not very hard  Food Insecurity: No Food Insecurity (08/04/2024)   Hunger Vital Sign    Worried About Running Out of Food in the Last Year: Never true    Ran Out of Food in the Last Year: Never true  Transportation Needs: No Transportation Needs (08/04/2024)   PRAPARE - Transportation    Lack of Transportation (Medical): No    Lack of Transportation (Non-Medical): No  Physical Activity: Sufficiently Active (08/04/2024)   Exercise Vital Sign    Days of Exercise per Week: 5 days    Minutes of Exercise per Session: 60 min  Stress: No Stress Concern Present (08/04/2024)   [employer] of Occupational Health - Occupational Stress Questionnaire    Feeling of Stress: Not at all  Social Connections: Moderately Integrated (08/04/2024)   Social Connection and Isolation Panel    Frequency of Communication with Friends and Family: Three times a week    Frequency of Social Gatherings with Friends and Family: More than three times a week    Attends Religious Services: More than 4 times per year    Active Member of [employer] or Organizations: Yes    Attends Banker Meetings: 1 to 4  times per year    Marital Status: Widowed    Review of Systems Per HPI  Objective:  BP 138/78   Ht 5' 7 (1.702 m)   Wt 156 lb 6.4 oz (70.9 kg)   BMI 24.50 kg/m      08/18/2024    8:48 AM 08/04/2024    8:47 AM 06/20/2024    9:21 AM  BP/Weight  Systolic BP 138 -- 138  Diastolic BP 78 -- 86  Wt. (Lbs) 156.4 160 160.6  BMI 24.5 kg/m2 25.06 kg/m2 25.15 kg/m2    Physical Exam Vitals and nursing note reviewed.  Constitutional:      General: She is not in acute distress.    Appearance: Normal appearance.  HENT:     Head: Normocephalic and atraumatic.  Eyes:     General:        Right eye: No discharge.        Left eye:  No discharge.     Conjunctiva/sclera: Conjunctivae normal.  Cardiovascular:     Rate and Rhythm: Normal rate and regular rhythm.  Pulmonary:     Effort: Pulmonary effort is normal.     Breath sounds: Normal breath sounds. No wheezing, rhonchi or rales.  Neurological:     Mental Status: She is alert.  Psychiatric:        Mood and Affect: Mood normal.        Behavior: Behavior normal.     Lab Results  Component Value Date   WBC 4.3 11/01/2023   HGB 14.8 11/01/2023   HCT 47.2 (H) 11/01/2023   PLT 235 11/01/2023   GLUCOSE 155 (H) 01/10/2024   CHOL 172 11/01/2023   TRIG 53 11/01/2023   HDL 93 11/01/2023   LDLCALC 68 11/01/2023   ALT 16 11/01/2023   AST 24 11/01/2023   NA 140 01/10/2024   K 5.1 01/10/2024   CL 103 01/10/2024   CREATININE 0.92 01/10/2024   BUN 14 01/10/2024   CO2 22 01/10/2024   TSH 1.360 11/16/2023   HGBA1C 8.1 (A) 06/20/2024   MICROALBUR 30 mg/L 10/05/2022     Assessment & Plan:  Type 2 diabetes mellitus without complication, with long-term current use of insulin (HCC) Assessment & Plan: Uncontrolled.  Increasing dose of metformin .  Discussed this with endocrinology.  Orders: -     CMP14+EGFR -     Microalbumin / creatinine urine ratio -     metFORMIN  HCl; Take 1 tablet (1,000 mg total) by mouth 2 (two) times daily with a meal.  Dispense: 180 tablet; Refill: 3  Need for vaccination -     Flu vaccine HIGH DOSE PF(Fluzone Trivalent)  Hyperlipidemia associated with type 2 diabetes mellitus (HCC) -     Lipid panel  Mucus in stool Assessment & Plan: Labs for further evaluation.  Benign abdominal exam today.  Orders: -     CBC -     Celiac Ab tTG DGP TIgA -     Calprotectin, Fecal -     Sedimentation rate  Primary hypertension Assessment & Plan: Stable on Amlodipine  and Enalapril .     Follow-up:  6 months (pending lab results).  Jacqulyn Ahle DO [facility]

## 2024-08-20 ENCOUNTER — Ambulatory Visit: Payer: Self-pay | Admitting: Family Medicine

## 2024-08-20 DIAGNOSIS — R195 Other fecal abnormalities: Secondary | ICD-10-CM | POA: Insufficient documentation

## 2024-08-20 NOTE — Assessment & Plan Note (Signed)
 Uncontrolled.  Increasing dose of metformin .  Discussed this with endocrinology.

## 2024-08-20 NOTE — Assessment & Plan Note (Signed)
 Stable on Amlodipine  and Enalapril .

## 2024-08-20 NOTE — Assessment & Plan Note (Signed)
 Labs for further evaluation.  Benign abdominal exam today.

## 2024-08-21 ENCOUNTER — Other Ambulatory Visit: Payer: Self-pay | Admitting: Family Medicine

## 2024-08-22 ENCOUNTER — Ambulatory Visit: Payer: Self-pay | Admitting: Family Medicine

## 2024-08-22 LAB — LIPID PANEL

## 2024-08-24 LAB — CALPROTECTIN, FECAL: Calprotectin, Fecal: 16 ug/g (ref 0–120)

## 2024-08-26 ENCOUNTER — Ambulatory Visit: Payer: Self-pay | Admitting: Family Medicine

## 2024-08-27 LAB — CELIAC AB TTG DGP TIGA
Antigliadin Abs, IgA: 2 U (ref 0–19)
Gliadin IgG: 1 U (ref 0–19)
IgA/Immunoglobulin A, Serum: 124 mg/dL (ref 87–352)
Tissue Transglut Ab: <2 U/mL (ref 0–5)
Transglutaminase IgA: <2 U/mL (ref 0–3)

## 2024-08-27 LAB — LIPID PANEL
Cholesterol, Total: 159 mg/dL (ref 100–199)
HDL: 75 mg/dL (ref >39)
LDL CALC COMMENT:: 2.1 ratio (ref 0.0–4.4)
LDL Chol Calc (NIH): 73 mg/dL (ref 0–99)
Triglycerides: 50 mg/dL (ref 0–149)
VLDL Cholesterol Cal: 11 mg/dL (ref 5–40)

## 2024-08-27 LAB — CMP14+EGFR
ALT: 15 IU/L (ref 0–32)
AST: 19 IU/L (ref 0–40)
Albumin: 4.6 g/dL (ref 3.9–4.9)
Alkaline Phosphatase: 71 IU/L (ref 44–121)
BUN/Creatinine Ratio: 19 (ref 12–28)
BUN: 17 mg/dL (ref 8–27)
Bilirubin Total: 0.2 mg/dL (ref 0.0–1.2)
CO2: 24 mmol/L (ref 20–29)
Calcium: 9.6 mg/dL (ref 8.7–10.3)
Chloride: 103 mmol/L (ref 96–106)
Creatinine, Ser: 0.89 mg/dL (ref 0.57–1.00)
Globulin, Total: 2.8 g/dL (ref 1.5–4.5)
Glucose: 110 mg/dL — AB (ref 70–99)
Potassium: 4.7 mmol/L (ref 3.5–5.2)
Sodium: 141 mmol/L (ref 134–144)
Total Protein: 7.4 g/dL (ref 6.0–8.5)
eGFR: 70 mL/min/1.73 (ref >59)

## 2024-08-27 LAB — CBC
Hematocrit: 44.3 % (ref 34.0–46.6)
Hemoglobin: 14.1 g/dL (ref 11.1–15.9)
MCH: 29.9 pg (ref 26.6–33.0)
MCHC: 31.8 g/dL (ref 31.5–35.7)
MCV: 94 fL (ref 79–97)
Platelets: 262 x10E3/uL (ref 150–450)
RBC: 4.72 x10E6/uL (ref 3.77–5.28)
RDW: 13.8 % (ref 11.7–15.4)
WBC: 4.1 x10E3/uL (ref 3.4–10.8)

## 2024-08-27 LAB — SEDIMENTATION RATE: Sed Rate: 3 mm/h (ref 0–40)

## 2024-08-27 LAB — MICROALBUMIN / CREATININE URINE RATIO
Creatinine, Urine: 44 mg/dL
Microalb/Creat Ratio: <7 mg/g{creat} (ref 0–29)
Microalbumin, Urine: <3 ug/mL

## 2024-08-27 LAB — CALPROTECTIN, FECAL

## 2024-10-19 ENCOUNTER — Ambulatory Visit
Admission: EM | Admit: 2024-10-19 | Discharge: 2024-10-19 | Disposition: A | Discharge status: Home / Self Care | Attending: Family Medicine | Admitting: Family Medicine

## 2024-10-19 DIAGNOSIS — J069 Acute upper respiratory infection, unspecified: Secondary | ICD-10-CM | POA: Diagnosis not present

## 2024-10-19 DIAGNOSIS — H1032 Unspecified acute conjunctivitis, left eye: Secondary | ICD-10-CM

## 2024-10-19 MED ORDER — AZELASTINE HCL 0.1 % NA SOLN: 1.0000 | Freq: Two times a day (BID) | NASAL | 0 refills | Status: AC

## 2024-10-19 MED ORDER — OFLOXACIN 0.3 % OP SOLN: 1.0000 [drp] | Freq: Four times a day (QID) | OPHTHALMIC | 0 refills | Status: AC

## 2024-10-19 NOTE — ED Provider Notes (Signed)
 [facility]    CSN: [id number] Arrival date & time: 10/19/24  0816      History   Chief Complaint No chief complaint on file.   HPI Mary Vazquez is a 68 y.o. female.   Patient presenting today with 1 day history of left eye redness, itching, discomfort, drainage.  Denies visual change, headache, nausea, vomiting, injury to the eye or chemical exposures.  Also having runny nose and postnasal drainage since yesterday.  Not try anything over-the-counter for symptoms so far.  Does take antihistamines for seasonal allergies.    Past Medical History:  Diagnosis Date   Diabetes (HCC)    Hypertension    PONV (postoperative nausea and vomiting)     Patient Active Problem List   Diagnosis Date Noted   Mucus in stool 08/20/2024   Right shoulder pain 04/05/2023   Lipoma 08/26/2022   Hand pain 05/19/2022   Hyperlipidemia associated with type 2 diabetes mellitus (HCC) 11/18/2021   Primary hypertension 03/02/2013   Type 2 diabetes mellitus without complication, with long-term current use of insulin (HCC) 03/02/2013   GERD (gastroesophageal reflux disease) 03/02/2013    Past Surgical History:  Procedure Laterality Date   COLONOSCOPY   08/08/2007   SLF:  A 5-mm sessile descending colon polyp removed via cold forceps.Normal retroflexed view of the rectum/ Small internal hemorrhoids, PATH: tubular adenoma   COLONOSCOPY  01/09/2013   Procedure: COLONOSCOPY;  Surgeon: Margo LITTIE Haddock, MD;  Location: [facility];  Service: Endoscopy;  Laterality: N/A;  8:30   COLONOSCOPY WITH PROPOFOL  N/A 01/12/2023   Procedure: COLONOSCOPY WITH PROPOFOL ;  Surgeon: Cindie Carlin POUR, DO;  Location: [facility];  Service: Endoscopy;  Laterality: N/A;  10:30am., asa 2   POLYPECTOMY  01/12/2023   Procedure: POLYPECTOMY;  Surgeon: Cindie Carlin POUR, DO;  Location: [facility];  Service: Endoscopy;;   TUBAL LIGATION      OB History     Gravida  3   Para  3   Term  3   Preterm       AB      Living         SAB      IAB      Ectopic      Multiple      Live Births               Home Medications    Prior to Admission medications   Medication Sig Start Date End Date Taking? Authorizing Provider  azelastine (ASTELIN) 0.1 % nasal spray Place 1 spray into both nostrils 2 (two) times daily. Use in each nostril as directed 10/19/24  Yes Stuart Vernell Norris, PA-C  ofloxacin (OCUFLOX) 0.3 % ophthalmic solution Place 1 drop into the left eye 4 (four) times daily. 10/19/24  Yes Stuart Vernell Norris, PA-C  acetaminophen  (TYLENOL ) 650 MG CR tablet Take 1,300 mg by mouth every 8 (eight) hours as needed for pain.    [provider]  amLODipine  (NORVASC ) 5 MG tablet Take 1 tablet (5 mg total) by mouth daily. 02/15/24   Cook, Jayce G, DO  atorvastatin  (LIPITOR) 20 MG tablet Take 1 tablet (20 mg total) by mouth daily. 02/15/24   Cook, Jayce G, DO  cetirizine  (ZYRTEC ) 10 MG tablet TAKE 1 TABLET BY MOUTH EVERY DAY AS NEEDED FOR ALLERGY 06/22/24   Cook, Jayce G, DO  Cholecalciferol (VITAMIN D3 PO) Take 2,000 Units by mouth daily. Patient reports that this is ain liquid  form    [provider]  enalapril  (VASOTEC ) 20 MG tablet Take 1 tablet (20 mg total) by mouth 2 (two) times daily. 02/15/24   Cook, Jayce G, DO  Insulin Glargine  (BASAGLAR  KWIKPEN) 100 UNIT/ML Inject 12 Units into the skin at bedtime. 06/20/24   Therisa Benton PARAS, NP  Insulin Pen Needle (B-D UF III MINI PEN NEEDLES) 31G X 5 MM MISC USE TO INJECT INSULIN ONCE DAILY 04/28/24   Therisa Benton PARAS, NP  meloxicam  (MOBIC ) 15 MG tablet Take 1 tablet (15 mg total) by mouth daily as needed for pain. 06/22/23   Cook, Jayce G, DO  metFORMIN  (GLUCOPHAGE ) 1000 MG tablet Take 1 tablet (1,000 mg total) by mouth 2 (two) times daily with a meal. 08/18/24   Cook, Jayce G, DO  Polyethyl Glycol-Propyl Glycol (SYSTANE OP) Apply to eye.    [provider]  Propylene Glycol (SYSTANE BALANCE) 0.6 % SOLN Place 1 drop  into both eyes daily as needed (dry eyes).    [provider]    Family History Family History  Problem Relation Age of Onset   Cancer Father        prostate   Diabetes Sister    Diabetes Maternal Grandmother    Diabetes Paternal Aunt    Colon cancer Neg Hx     Social History Social History   Tobacco Use   Smoking status: Never   Smokeless tobacco: Never  Vaping Use   Vaping status: Never Used  Substance Use Topics   Alcohol use: No   Drug use: No     Allergies   Ozempic  (0.25 or 0.5 mg-dose) [semaglutide (0.25 or 0.5mg -dos)] and Septra [sulfamethoxazole-trimethoprim]   Review of Systems Review of Systems Per HPI  Physical Exam Triage Vital Signs ED Triage Vitals  Encounter Vitals Group     BP 10/19/24 0823 (!) 154/85     Girls Systolic BP Percentile --      Girls Diastolic BP Percentile --      Boys Systolic BP Percentile --      Boys Diastolic BP Percentile --      Pulse Rate 10/19/24 0823 (!) 57     Resp 10/19/24 0823 20     Temp 10/19/24 0823 98.3 F (36.8 C)     Temp Source 10/19/24 0823 Oral     SpO2 10/19/24 0823 94 %     Weight --      Height --      Head Circumference --      Peak Flow --      Pain Score 10/19/24 0826 0     Pain Loc --      Pain Education --      Exclude from Growth Chart --    No data found.  Updated Vital Signs BP (!) 154/85 (BP Location: Right Arm)   Pulse (!) 57   Temp 98.3 F (36.8 C) (Oral)   Resp 20   SpO2 94%   Visual Acuity Right Eye Distance:   Left Eye Distance:   Bilateral Distance:    Right Eye Near:   Left Eye Near:    Bilateral Near:     Physical Exam Vitals and nursing note reviewed.  Constitutional:      Appearance: Normal appearance. She is not ill-appearing.  HENT:     Head: Atraumatic.     Right Ear: Tympanic membrane normal.     Left Ear: Tympanic membrane normal.     Nose: Rhinorrhea present.  Mouth/Throat:     Pharynx: Posterior oropharyngeal erythema present.  Eyes:      Extraocular Movements: Extraocular movements intact.     Pupils: Pupils are equal, round, and reactive to light.     Comments: Left conjunctival erythema, injection, drainage  Cardiovascular:     Rate and Rhythm: Normal rate and regular rhythm.     Heart sounds: Normal heart sounds.  Pulmonary:     Effort: Pulmonary effort is normal.     Breath sounds: Normal breath sounds.  Musculoskeletal:        General: Normal range of motion.     Cervical back: Normal range of motion and neck supple.  Skin:    General: Skin is warm and dry.  Neurological:     Mental Status: She is alert and oriented to person, place, and time.  Psychiatric:        Mood and Affect: Mood normal.        Thought Content: Thought content normal.        Judgment: Judgment normal.      UC Treatments / Results  Labs (all labs ordered are listed, but only abnormal results are displayed) Labs Reviewed - No data to display  EKG   Radiology No results found.  Procedures Procedures (including critical care time)  Medications Ordered in UC Medications - No data to display  Initial Impression / Assessment and Plan / UC Course  I have reviewed the triage vital signs and the nursing notes.  Pertinent labs & imaging results that were available during my care of the patient were reviewed by me and considered in my medical decision making (see chart for details).     Visual acuity deferred as vision intact per patient.  Vital signs reassuring today.  Suspect viral versus bacterial conjunctivitis and viral respiratory infection.  Treat with Ocuflox drops, warm compresses, Astelin nasal spray, antihistamines.  Supportive over-the-counter medications and home care recommended.  Final Clinical Impressions(s) / UC Diagnoses   Final diagnoses:  Acute conjunctivitis of left eye, unspecified acute conjunctivitis type  Viral URI with cough   Discharge Instructions   None    ED Prescriptions     Medication  Sig Dispense Auth. Provider   ofloxacin (OCUFLOX) 0.3 % ophthalmic solution Place 1 drop into the left eye 4 (four) times daily. 5 mL Stuart Vernell Norris, PA-C   azelastine (ASTELIN) 0.1 % nasal spray Place 1 spray into both nostrils 2 (two) times daily. Use in each nostril as directed 30 mL Stuart Vernell Norris, PA-C      PDMP not reviewed this encounter.   Stuart Vernell Norris, [place] 10/19/24 1956

## 2024-10-19 NOTE — ED Triage Notes (Signed)
 Pt reports redness, swelling in the left eye, itching burning, drainage from the eye. Started last night.

## 2024-10-25 ENCOUNTER — Ambulatory Visit: Admitting: Nurse Practitioner

## 2024-10-25 ENCOUNTER — Encounter: Payer: Self-pay | Admitting: Nurse Practitioner

## 2024-10-25 VITALS — BP 136/82 | HR 72 | Ht 67.0 in | Wt 154.0 lb

## 2024-10-25 DIAGNOSIS — Z7984 Long term (current) use of oral hypoglycemic drugs: Secondary | ICD-10-CM | POA: Diagnosis not present

## 2024-10-25 DIAGNOSIS — Z794 Long term (current) use of insulin: Secondary | ICD-10-CM

## 2024-10-25 DIAGNOSIS — E119 Type 2 diabetes mellitus without complications: Secondary | ICD-10-CM | POA: Diagnosis not present

## 2024-10-25 LAB — POCT GLYCOSYLATED HEMOGLOBIN (HGB A1C): Hemoglobin A1C: 7.6 % — AB (ref 4.0–5.6)

## 2024-10-25 MED ORDER — METFORMIN HCL 1000 MG PO TABS: ORAL_TABLET | ORAL | 3 refills | Status: AC

## 2024-10-25 NOTE — Progress Notes (Signed)
 Endocrinology Follow Up Note       10/25/2024, 10:36 AM   Subjective:    Patient ID: Mary Vazquez, female    DOB: 11-Jun-1956.  Mary Vazquez is being seen in follow up after being seen in consultation for management of currently uncontrolled symptomatic diabetes requested by  Cook, Jayce G, DO.   Past Medical History:  Diagnosis Date   Diabetes (HCC)    Hypertension    PONV (postoperative nausea and vomiting)     Past Surgical History:  Procedure Laterality Date   COLONOSCOPY   08/08/2007   SLF:  A 5-mm sessile descending colon polyp removed via cold forceps.Normal retroflexed view of the rectum/ Small internal hemorrhoids, PATH: tubular adenoma   COLONOSCOPY  01/09/2013   Procedure: COLONOSCOPY;  Surgeon: Margo LITTIE Haddock, MD;  Location: [facility];  Service: Endoscopy;  Laterality: N/A;  8:30   COLONOSCOPY WITH PROPOFOL  N/A 01/12/2023   Procedure: COLONOSCOPY WITH PROPOFOL ;  Surgeon: Cindie Carlin POUR, DO;  Location: [facility];  Service: Endoscopy;  Laterality: N/A;  10:30am., asa 2   POLYPECTOMY  01/12/2023   Procedure: POLYPECTOMY;  Surgeon: Cindie Carlin POUR, DO;  Location: [facility];  Service: Endoscopy;;   TUBAL LIGATION      Social History   Socioeconomic History   Marital status: Widowed    Spouse name: Not on file   Number of children: 3   Years of education: Not on file   Highest education level: Some college, no degree  Occupational History   Not on file  Tobacco Use   Smoking status: Never   Smokeless tobacco: Never  Vaping Use   Vaping status: Never Used  Substance and Sexual Activity   Alcohol use: No   Drug use: No   Sexual activity: Not Currently    Birth control/protection: Post-menopausal, Surgical    Comment: tubal  Other Topics Concern   Not on file  Social History Narrative   Husband passed 01/2020.   3 children    2 grandchildren   Social Drivers  of Corporate Investment Banker Strain: Low Risk  (08/04/2024)   Overall Financial Resource Strain (CARDIA)    Difficulty of Paying Living Expenses: Not very hard  Food Insecurity: No Food Insecurity (08/04/2024)   Hunger Vital Sign    Worried About Running Out of Food in the Last Year: Never true    Ran Out of Food in the Last Year: Never true  Transportation Needs: No Transportation Needs (08/04/2024)   PRAPARE - Transportation    Lack of Transportation (Medical): No    Lack of Transportation (Non-Medical): No  Physical Activity: Sufficiently Active (08/04/2024)   Exercise Vital Sign    Days of Exercise per Week: 5 days    Minutes of Exercise per Session: 60 min  Stress: No Stress Concern Present (08/04/2024)   [employer] of Occupational Health - Occupational Stress Questionnaire    Feeling of Stress: Not at all  Social Connections: Moderately Integrated (08/04/2024)   Social Connection and Isolation Panel    Frequency of Communication with Friends and Family: Three times a week    Frequency of Social Gatherings with Friends  and Family: More than three times a week    Attends Religious Services: More than 4 times per year    Active Member of Clubs or Organizations: Yes    Attends Banker Meetings: 1 to 4 times per year    Marital Status: Widowed    Family History  Problem Relation Age of Onset   Cancer Father        prostate   Diabetes Sister    Diabetes Maternal Grandmother    Diabetes Paternal Aunt    Colon cancer Neg Hx     Outpatient Encounter Medications as of 10/25/2024  Medication Sig   acetaminophen  (TYLENOL ) 650 MG CR tablet Take 1,300 mg by mouth every 8 (eight) hours as needed for pain.   amLODipine  (NORVASC ) 5 MG tablet Take 1 tablet (5 mg total) by mouth daily.   atorvastatin  (LIPITOR) 20 MG tablet Take 1 tablet (20 mg total) by mouth daily.   azelastine (ASTELIN) 0.1 % nasal spray Place 1 spray into both nostrils 2 (two) times daily. Use in  each nostril as directed   cetirizine  (ZYRTEC ) 10 MG tablet TAKE 1 TABLET BY MOUTH EVERY DAY AS NEEDED FOR ALLERGY (Patient taking differently: As needed)   Cholecalciferol (VITAMIN D3 PO) Take 2,000 Units by mouth daily. Patient reports that this is ain liquid form   enalapril  (VASOTEC ) 20 MG tablet Take 1 tablet (20 mg total) by mouth 2 (two) times daily.   Insulin Glargine  (BASAGLAR  KWIKPEN) 100 UNIT/ML Inject 12 Units into the skin at bedtime.   Insulin Pen Needle (B-D UF III MINI PEN NEEDLES) 31G X 5 MM MISC USE TO INJECT INSULIN ONCE DAILY   meloxicam  (MOBIC ) 15 MG tablet Take 1 tablet (15 mg total) by mouth daily as needed for pain.   ofloxacin (OCUFLOX) 0.3 % ophthalmic solution Place 1 drop into the left eye 4 (four) times daily.   Propylene Glycol (SYSTANE BALANCE) 0.6 % SOLN Place 1 drop into both eyes daily as needed (dry eyes).   [DISCONTINUED] metFORMIN  (GLUCOPHAGE ) 1000 MG tablet Take 1 tablet (1,000 mg total) by mouth 2 (two) times daily with a meal. (Patient taking differently: Take 500 mg by mouth 2 (two) times daily with a meal.)   metFORMIN  (GLUCOPHAGE ) 1000 MG tablet Take 500 mg po daily at breakfast and 1000 mg po daily at supper   [DISCONTINUED] Polyethyl Glycol-Propyl Glycol (SYSTANE OP) Apply to eye.   No facility-administered encounter medications on file as of 10/25/2024.    ALLERGIES: Allergies  Allergen Reactions   Ozempic  (0.25 Or 0.5 Mg-Dose) [Semaglutide (0.25 Or 0.5mg -Dos)] Nausea And Vomiting   Septra [Sulfamethoxazole-Trimethoprim] Swelling and Rash    VACCINATION STATUS: Immunization History  Administered Date(s) Administered   Fluad Quad(high Dose 65+) 10/09/2020, 10/07/2021, 08/25/2022   Fluad Trivalent(High Dose 65+) 11/01/2023   INFLUENZA, HIGH DOSE SEASONAL PF 10/23/2019, 08/18/2024   Influenza,inj,Quad PF,6+ Mos 08/23/2014, 11/18/2015, 11/01/2017, 10/18/2018   Influenza-Unspecified 10/01/2016, 10/18/2018   Moderna SARS-COV2 Booster Vaccination  10/30/2020, 06/10/2021   Moderna Sars-Covid-2 Vaccination 03/24/2020, 04/22/2020   PNEUMOCOCCAL CONJUGATE-20 11/24/2022   Td 06/29/2016    Diabetes She presents for her follow-up diabetic visit. She has type 2 diabetes mellitus. Onset time: Diagnosed at approx age of 28 (also had Gest DM) Her disease course has been improving. There are no hypoglycemic associated symptoms. There are no diabetic associated symptoms. There are no hypoglycemic complications. There are no diabetic complications. Risk factors for coronary artery disease include diabetes mellitus and family history. Current  diabetic treatment includes oral agent (monotherapy) and insulin injections. She is compliant with treatment most of the time. Her weight is fluctuating minimally. She is following a generally healthy diet. She has had a previous visit with a dietitian. She participates in exercise daily. Her home blood glucose trend is decreasing steadily. Her breakfast blood glucose range is generally 90-110 mg/dl. Her bedtime blood glucose range is generally 130-140 mg/dl. (She presents today with her logs showing at goal glycemic profile overall.  Her POCT A1c today is 7.6%, improving from last visit of 8.1%.  She notes she has cut out all cakes from her diet and is even limiting the amount of fruit she takes in to help control glucose.  Her PCP did increase her Metformin  to 1000 mg po twice daily but he had given verbal instructions to start by taking 500 mg in the morning and 1000 mg at supper but no clear instructions on when to increase.  She denies any significant hypoglycemia.) An ACE inhibitor/angiotensin II receptor blocker is being taken. She does not see a podiatrist.Eye exam is current.   Review of systems  Constitutional: + decreasing body weight,  current Body mass index is 24.12 kg/m. , no fatigue, no subjective hyperthermia, no subjective hypothermia Eyes: no blurry vision, no xerophthalmia ENT: no sore throat, no  nodules palpated in throat, no dysphagia/odynophagia, no hoarseness Cardiovascular: no chest pain, no shortness of breath, no palpitations, no leg swelling Respiratory: no cough, no shortness of breath Gastrointestinal: no nausea/vomiting/diarrhea, + mucous in stool- has seen PCP for this Musculoskeletal: no muscle/joint aches Skin: no rashes, no hyperemia Neurological: no tremors, no numbness, no tingling, no dizziness Psychiatric: no depression, no anxiety  Objective:     BP 136/82 (BP Location: Left Arm, Patient Position: Sitting, Cuff Size: Large)   Pulse 72   Ht 5' 7 (1.702 m)   Wt 154 lb (69.9 kg)   BMI 24.12 kg/m   Wt Readings from Last 3 Encounters:  10/25/24 154 lb (69.9 kg)  08/18/24 156 lb 6.4 oz (70.9 kg)  08/04/24 160 lb (72.6 kg)     BP Readings from Last 3 Encounters:  10/25/24 136/82  10/19/24 (!) 154/85  08/18/24 138/78      Physical Exam- Limited  Constitutional:  Body mass index is 24.12 kg/m. , not in acute distress, normal state of mind Eyes:  EOMI, no exophthalmos Musculoskeletal: no gross deformities, strength intact in all four extremities, no gross restriction of joint movements Skin:  no rashes, no hyperemia Neurological: no tremor with outstretched hands   Diabetic Foot Exam - Simple   No data filed     CMP ( most recent) CMP     Component Value Date/Time   NA 141 08/21/2024 0811   K 4.7 08/21/2024 0811   CL 103 08/21/2024 0811   CO2 24 08/21/2024 0811   GLUCOSE 110 (H) 08/21/2024 0811   GLUCOSE 128 (H) 01/08/2014 1209   BUN 17 08/21/2024 0811   CREATININE 0.89 08/21/2024 0811   CREATININE 0.91 01/08/2014 1209   CALCIUM  9.6 08/21/2024 0811   PROT 7.4 08/21/2024 0811   ALBUMIN 4.6 08/21/2024 0811   AST 19 08/21/2024 0811   ALT 15 08/21/2024 0811   ALKPHOS 71 08/21/2024 0811   BILITOT 0.2 08/21/2024 0811   GFRNONAA 69 10/18/2020 0821   GFRAA 79 10/18/2020 0821     Diabetic Labs (most recent): Lab Results  Component  Value Date   HGBA1C 7.6 (A) 10/25/2024   HGBA1C  8.1 (A) 06/20/2024   HGBA1C 8.2 (A) 02/18/2024   MICROALBUR 30 mg/L 10/05/2022   MICROALBUR 1.22 01/08/2014     Lipid Panel ( most recent) Lipid Panel     Component Value Date/Time   CHOL 159 08/21/2024 0811   TRIG 50 08/21/2024 0811   HDL 75 08/21/2024 0811   CHOLHDL 2.1 08/21/2024 0811   CHOLHDL 2.8 01/08/2014 1209   VLDL 17 01/08/2014 1209   LDLCALC 73 08/21/2024 0811   LABVLDL 11 08/21/2024 0811      Lab Results  Component Value Date   TSH 1.360 11/16/2023   FREET4 0.94 11/16/2023           Assessment & Plan:   1) Type 2 diabetes mellitus with stage 2 chronic kidney disease, without long-term current use of insulin (HCC)  She presents today with her logs showing at goal glycemic profile overall.  Her POCT A1c today is 7.6%, improving from last visit of 8.1%.  She notes she has cut out all cakes from her diet and is even limiting the amount of fruit she takes in to help control glucose.  Her PCP did increase her Metformin  to 1000 mg po twice daily but he had given verbal instructions to start by taking 500 mg in the morning and 1000 mg at supper but no clear instructions on when to increase.  She denies any significant hypoglycemia.  - Mary Vazquez has currently uncontrolled symptomatic type 2 DM since 68 years of age.   -Recent labs reviewed.  - I had a long discussion with her about the progressive nature of diabetes and the pathology behind its complications. -her diabetes is not currently complicated but she remains at a high risk for more acute and chronic complications which include CAD, CVA, CKD, retinopathy, and neuropathy. These are all discussed in detail with her.  The following Lifestyle Medicine recommendations according to American College of Lifestyle Medicine [facility]) were discussed and offered to patient and she agrees to start the journey:  A. Whole Foods, Plant-based plate comprising of fruits  and vegetables, plant-based proteins, whole-grain carbohydrates was discussed in detail with the patient.   A list for source of those nutrients were also provided to the patient.  Patient will use only water  or unsweetened tea for hydration. B.  The need to stay away from risky substances including alcohol, smoking; obtaining 7 to 9 hours of restorative sleep, at least 150 minutes of moderate intensity exercise weekly, the importance of healthy social connections,  and stress reduction techniques were discussed. C.  A full color page of  Calorie density of various food groups per pound showing examples of each food groups was provided to the patient.  - Nutritional counseling repeated/built upon at each appointment.  - The patient admits there is a room for improvement in their diet and drink choices. -  Suggestion is made for the patient to avoid simple carbohydrates from their diet including Cakes, Sweet Desserts / Pastries, Ice Cream, Soda (diet and regular), Sweet Tea, Candies, Chips, Cookies, Sweet Pastries, Store Bought Juices, Alcohol in Excess of 1-2 drinks a day, Artificial Sweeteners, Coffee Creamer, and Sugar-free Products. This will help patient to have stable blood glucose profile and potentially avoid unintended weight gain.   - I encouraged the patient to switch to unprocessed or minimally processed complex starch and increased protein intake (animal or plant source), fruits, and vegetables.   - Patient is advised to stick to a routine mealtimes to eat 3  meals a day and avoid unnecessary snacks (to snack only to correct hypoglycemia).  - I have approached her with the following individualized plan to manage her diabetes and patient agrees:   -She is advised to continue Metformin  500 mg at breakfast and 1000 mg po at supper as she has done reasonably well on this dose and continue her Basaglar  12 units SQ nightly.  I am not sure if the Metformin  is contributing to some recent gut  troubles, so would rather take it slowly and keep her where she is for now.  -she is encouraged to continue monitoring glucose twice daily, before breakfast and before bed, and to call the clinic if she has readings less than 70 or above 300 for 3 tests in a row.  She was offered CGM in the past but politely declined as she is content with fingersticking.  - Adjustment parameters are given to her for hypo and hyperglycemia in writing.  - her Jardiance  will be discontinued, risk outweighs benefit for this patient.  She notes significant vaginal yeast with this medication, requiring prescriptions to alleviate.  - she is not an ideal candidate for incretin therapy due to body habitus and BMI less than 25.  She also notes allergy to Ozempic  in the past.  - Specific targets for  A1c; LDL, HDL, and Triglycerides were discussed with the patient.  2) Blood Pressure /Hypertension:  her blood pressure is controlled to target.   she is advised to continue her current medications as prescribed by her PCP.  3) Lipids/Hyperlipidemia:    Review of her recent lipid panel from 08/21/24 showed controlled LDL at 73 . She is advised to continue Lipitor 20 mg daily.    4)  Weight/Diet:  her Body mass index is 24.12 kg/m.  -   she is NOT a candidate for weight loss.  Exercise, and detailed carbohydrates information provided  -  detailed on discharge instructions.  5) Chronic Care/Health Maintenance: -she is on ACEI/ARB and Statin medications and is encouraged to initiate and continue to follow up with Ophthalmology, Dentist, Podiatrist at least yearly or according to recommendations, and advised to stay away from smoking. I have recommended yearly flu vaccine and pneumonia vaccine at least every 5 years; moderate intensity exercise for up to 150 minutes weekly; and sleep for at least 7 hours a day.  - she is advised to maintain close follow up with Cook, Jayce G, DO for primary care needs, as well as her other  providers for optimal and coordinated care.      I spent  48  minutes in the care of the patient today including review of labs from CMP, Lipids, Thyroid  Function, Hematology (current and previous including abstractions from other facilities); face-to-face time discussing  her blood glucose readings/logs, discussing hypoglycemia and hyperglycemia episodes and symptoms, medications doses, her options of short and long term treatment based on the latest standards of care / guidelines;  discussion about incorporating lifestyle medicine;  and documenting the encounter. Risk reduction counseling performed per USPSTF guidelines to reduce obesity and cardiovascular risk factors.     Please refer to Patient Instructions for Blood Glucose Monitoring and Insulin/Medications Dosing Guide  in media tab for additional information. Please  also refer to  Patient Self Inventory in the Media  tab for reviewed elements of pertinent patient history.  Mary Vazquez participated in the discussions, expressed understanding, and voiced agreement with the above plans.  All questions were answered to her satisfaction.  she is encouraged to contact clinic should she have any questions or concerns prior to her return visit.     Follow up plan: - Return in about 4 months (around 02/22/2025) for Diabetes F/U with A1c in office, No previsit labs, Bring meter and logs.   Benton Rio, [facility] [address] [place], [place] [address] Phone: [phone] Fax: [phone]  10/25/2024, 10:36 AM

## 2025-02-15 ENCOUNTER — Ambulatory Visit: Admitting: Family Medicine

## 2025-02-22 ENCOUNTER — Ambulatory Visit: Admitting: Nurse Practitioner

## 2025-08-10 ENCOUNTER — Ambulatory Visit
